# Patient Record
Sex: Female | Born: 1955
Health system: Southern US, Community
[De-identification: ages and names within clinical notes are randomized; demographics above are authoritative.]

## PROBLEM LIST (undated history)

## (undated) ENCOUNTER — Emergency Department (HOSPITAL_COMMUNITY): Payer: 59 | Source: Home / Self Care

## (undated) DIAGNOSIS — Z973 Presence of spectacles and contact lenses: Secondary | ICD-10-CM

## (undated) DIAGNOSIS — D219 Benign neoplasm of connective and other soft tissue, unspecified: Secondary | ICD-10-CM

## (undated) DIAGNOSIS — R8789 Other abnormal findings in specimens from female genital organs: Secondary | ICD-10-CM

## (undated) DIAGNOSIS — D649 Anemia, unspecified: Secondary | ICD-10-CM

## (undated) DIAGNOSIS — R112 Nausea with vomiting, unspecified: Secondary | ICD-10-CM

## (undated) DIAGNOSIS — M1990 Unspecified osteoarthritis, unspecified site: Secondary | ICD-10-CM

## (undated) DIAGNOSIS — T7840XA Allergy, unspecified, initial encounter: Secondary | ICD-10-CM

## (undated) DIAGNOSIS — J4520 Mild intermittent asthma, uncomplicated: Secondary | ICD-10-CM

## (undated) DIAGNOSIS — Z9889 Other specified postprocedural states: Secondary | ICD-10-CM

## (undated) DIAGNOSIS — H269 Unspecified cataract: Secondary | ICD-10-CM

## (undated) DIAGNOSIS — D259 Leiomyoma of uterus, unspecified: Secondary | ICD-10-CM

## (undated) DIAGNOSIS — K219 Gastro-esophageal reflux disease without esophagitis: Secondary | ICD-10-CM

## (undated) DIAGNOSIS — D573 Sickle-cell trait: Secondary | ICD-10-CM

## (undated) DIAGNOSIS — D509 Iron deficiency anemia, unspecified: Secondary | ICD-10-CM

## (undated) DIAGNOSIS — Z972 Presence of dental prosthetic device (complete) (partial): Secondary | ICD-10-CM

## (undated) DIAGNOSIS — T8859XA Other complications of anesthesia, initial encounter: Secondary | ICD-10-CM

## (undated) DIAGNOSIS — I10 Essential (primary) hypertension: Secondary | ICD-10-CM

## (undated) DIAGNOSIS — R32 Unspecified urinary incontinence: Secondary | ICD-10-CM

## (undated) DIAGNOSIS — G8929 Other chronic pain: Secondary | ICD-10-CM

## (undated) DIAGNOSIS — J45909 Unspecified asthma, uncomplicated: Secondary | ICD-10-CM

## (undated) DIAGNOSIS — N393 Stress incontinence (female) (male): Secondary | ICD-10-CM

## (undated) HISTORY — DX: Anemia, unspecified: D64.9

## (undated) HISTORY — DX: Sickle-cell trait: D57.3

## (undated) HISTORY — DX: Essential (primary) hypertension: I10

## (undated) HISTORY — DX: Unspecified urinary incontinence: R32

## (undated) HISTORY — DX: Unspecified asthma, uncomplicated: J45.909

## (undated) HISTORY — DX: Unspecified cataract: H26.9

## (undated) HISTORY — PX: COLONOSCOPY: SHX174

## (undated) HISTORY — DX: Allergy, unspecified, initial encounter: T78.40XA

## (undated) HISTORY — DX: Unspecified osteoarthritis, unspecified site: M19.90

## (undated) HISTORY — DX: Other abnormal findings in specimens from female genital organs: R87.89

## (undated) HISTORY — PX: TUBAL LIGATION: SHX77

## (undated) HISTORY — DX: Benign neoplasm of connective and other soft tissue, unspecified: D21.9

---

## 2008-07-19 ENCOUNTER — Encounter: Payer: Self-pay | Admitting: Family Medicine

## 2008-07-19 ENCOUNTER — Ambulatory Visit: Payer: Self-pay | Admitting: Family Medicine

## 2008-07-19 DIAGNOSIS — I10 Essential (primary) hypertension: Secondary | ICD-10-CM | POA: Insufficient documentation

## 2008-07-19 DIAGNOSIS — E669 Obesity, unspecified: Secondary | ICD-10-CM | POA: Insufficient documentation

## 2008-07-19 LAB — CONVERTED CEMR LAB: Whiff Test: NEGATIVE

## 2008-07-20 DIAGNOSIS — D509 Iron deficiency anemia, unspecified: Secondary | ICD-10-CM | POA: Insufficient documentation

## 2008-07-20 DIAGNOSIS — I10 Essential (primary) hypertension: Secondary | ICD-10-CM | POA: Insufficient documentation

## 2008-07-21 LAB — CONVERTED CEMR LAB
CO2: 24 meq/L (ref 19–32)
Creatinine, Ser: 0.83 mg/dL (ref 0.40–1.20)
Glucose, Bld: 83 mg/dL (ref 70–99)
HCT: 29.6 % — ABNORMAL LOW (ref 36.0–46.0)
MCV: 58.5 fL — ABNORMAL LOW (ref 78.0–100.0)
RBC: 5.06 M/uL (ref 3.87–5.11)
Sodium: 134 meq/L — ABNORMAL LOW (ref 135–145)
Total Bilirubin: 0.7 mg/dL (ref 0.3–1.2)
Total Protein: 7.9 g/dL (ref 6.0–8.3)
WBC: 7.2 10*3/uL (ref 4.0–10.5)

## 2008-07-22 ENCOUNTER — Encounter: Payer: Self-pay | Admitting: Family Medicine

## 2009-02-18 ENCOUNTER — Ambulatory Visit: Payer: Self-pay | Admitting: Family Medicine

## 2009-04-11 ENCOUNTER — Telehealth: Payer: Self-pay | Admitting: *Deleted

## 2009-04-18 ENCOUNTER — Encounter: Admission: RE | Admit: 2009-04-18 | Discharge: 2009-04-18 | Payer: Self-pay | Admitting: Family Medicine

## 2009-04-26 ENCOUNTER — Ambulatory Visit: Payer: Self-pay | Admitting: Family Medicine

## 2009-04-26 LAB — CONVERTED CEMR LAB: Hemoglobin: 11.8 g/dL

## 2009-05-09 ENCOUNTER — Ambulatory Visit: Payer: Self-pay | Admitting: Gastroenterology

## 2009-05-25 ENCOUNTER — Ambulatory Visit: Payer: Self-pay | Admitting: Gastroenterology

## 2010-06-06 ENCOUNTER — Encounter: Admission: RE | Admit: 2010-06-06 | Discharge: 2010-06-06 | Payer: Self-pay | Admitting: Family Medicine

## 2010-06-12 ENCOUNTER — Ambulatory Visit: Payer: Self-pay | Admitting: Family Medicine

## 2010-06-12 ENCOUNTER — Ambulatory Visit (HOSPITAL_COMMUNITY): Admission: RE | Admit: 2010-06-12 | Discharge: 2010-06-12 | Payer: Self-pay | Admitting: Family Medicine

## 2010-06-12 ENCOUNTER — Other Ambulatory Visit: Admission: RE | Admit: 2010-06-12 | Discharge: 2010-06-12 | Payer: Self-pay | Admitting: Family Medicine

## 2010-06-12 ENCOUNTER — Encounter: Payer: Self-pay | Admitting: Family Medicine

## 2010-06-12 DIAGNOSIS — N760 Acute vaginitis: Secondary | ICD-10-CM | POA: Insufficient documentation

## 2010-06-12 DIAGNOSIS — I739 Peripheral vascular disease, unspecified: Secondary | ICD-10-CM | POA: Insufficient documentation

## 2010-06-12 DIAGNOSIS — M542 Cervicalgia: Secondary | ICD-10-CM | POA: Insufficient documentation

## 2010-06-12 LAB — CONVERTED CEMR LAB

## 2010-06-20 ENCOUNTER — Encounter: Payer: Self-pay | Admitting: Family Medicine

## 2010-07-04 ENCOUNTER — Ambulatory Visit: Payer: Self-pay | Admitting: Family Medicine

## 2010-07-18 ENCOUNTER — Encounter: Payer: Self-pay | Admitting: Family Medicine

## 2010-07-18 LAB — CONVERTED CEMR LAB
AST: 11 units/L
Alkaline Phosphatase: 45 units/L
BUN: 19 mg/dL
Calcium: 9 mg/dL
Cholesterol: 134 mg/dL
Creatinine, Ser: 0.9 mg/dL
Hemoglobin: 10.5 g/dL
MCHC: 35 g/dL
MCV: 67 fL
Potassium: 4.6 meq/L
RDW: 17.6 %
Total Bilirubin: 0.8 mg/dL

## 2010-07-19 ENCOUNTER — Encounter: Payer: Self-pay | Admitting: Family Medicine

## 2010-09-19 NOTE — Miscellaneous (Signed)
  Clinical Lists Changes  Observations: Added new observation of TSH: 2.6 microintl units/mL (07/18/2010 14:09) Added new observation of CALCIUM: 9.0 mg/dL (09/81/1914 78:29) Added new observation of ALBUMIN: 4.4 g/dL (56/21/3086 57:84) Added new observation of SGPT (ALT): 8.0 units/L (07/18/2010 14:09) Added new observation of SGOT (AST): 11 units/L (07/18/2010 14:09) Added new observation of ALK PHOS: 45 units/L (07/18/2010 14:09) Added new observation of BILI TOTAL: 0.8 mg/dL (69/62/9528 41:32) Added new observation of CREATININE: 0.9 mg/dL (44/08/270 53:66) Added new observation of BUN: 19 mg/dL (44/10/4740 59:56) Added new observation of BG RANDOM: 114 mg/dL (38/75/6433 29:51) Added new observation of CO2 PLSM/SER: 26 meq/L (07/18/2010 14:09) Added new observation of CL SERUM: 105 meq/L (07/18/2010 14:09) Added new observation of K SERUM: 4.6 meq/L (07/18/2010 14:09) Added new observation of NA: 143 meq/L (07/18/2010 14:09) Added new observation of PLATELETK/UL: 253 K/uL (07/18/2010 14:09) Added new observation of RDW: 17.6 % (07/18/2010 14:09) Added new observation of MCHC RBC: 35 g/dL (88/41/6606 30:16) Added new observation of MCV: 67 fL (07/18/2010 14:09) Added new observation of HCT: 29.3 % (07/18/2010 14:09) Added new observation of HGB: 10.5 g/dL (08/28/3233 57:32) Added new observation of LDL: 84 mg/dL (20/25/4270 62:37) Added new observation of HDL: 34 mg/dL (62/83/1517 61:60) Added new observation of TRIGLYC TOT: 79 mg/dL (73/71/0626 94:85) Added new observation of CHOLESTEROL: 134 mg/dL (46/27/0350 09:38)

## 2010-09-19 NOTE — Miscellaneous (Signed)
  Clinical Lists Changes  Problems: Removed problem of HEALTH MAINTENANCE EXAM (ICD-V70.0) Removed problem of SCREENING FOR MALIGNANT NEOPLASM OF THE CERVIX (ICD-V76.2)

## 2010-09-19 NOTE — Assessment & Plan Note (Signed)
Summary: PVD testing- [facility]   Vital Signs:  Patient profile:   55 year old female Weight:      191.5 pounds Pulse rate:   83 / minute BP sitting:   152 / 89  (left arm)  History of Present Illness: Reports intermittent pain with walking that is not reproducible.  Pain is described as throbbing after random intervals of walking.   Reports pain while sleeping 3-4 times per week. Denies pain worsens when walking up hill or in a hurry.  Patient denies smoking and is not diagnosed with diabetes mellitus.      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 She is managing this nocturnal pain with 800mg  ibuprofen which relieves her pain.   Educated patient on results.  Verbalized understanding of results.  F/U Clinic Visit with Luretha Murphy in 2-3 weeks to continue work up of leg pain.  Total time with patient in face-to-face counseling: 20     minutes.  Patient seen with: Linward Headland, PharmD candidate.   Orders: Inital Assessment Each - [facility] [phone])  Complete Medication List: 1)  Cardizem La 240 Mg Xr24h-tab (Diltiazem hcl coated beads) .... One daily 2)  Ferrous Sulfate 324 Mg Tbec (Ferrous sulfate) .... Daily 3)  Tramadol-acetaminophen 37.5-325 Mg Tabs (Tramadol-acetaminophen) .... One-two three times a day as needed pain 4)   Ibuprofen 800 Mg Tabs (Ibuprofen) .... Three times a day as needed for pain 5)  Flexeril 10 Mg Tabs (Cyclobenzaprine hcl) .... One at bedtime as needed muscle spasm 6)  Lisinopril-hydrochlorothiazide 10-12.5 Mg Tabs (Lisinopril-hydrochlorothiazide) .... One daily   Orders Added: 1)  Inital Assessment Each - [facility] [76195]

## 2010-09-19 NOTE — Assessment & Plan Note (Signed)
Summary: cpe,df   Vital Signs:  Patient profile:   55 year old female Height:      65.5 inches Weight:      192 pounds BMI:     31.58 Temp:     99.1 degrees F oral Pulse rate:   101 / minute BP sitting:   147 / 87  (left arm) Cuff size:   regular  Vitals Entered By: Tessie Fass CMA (June 12, 2010 8:35 AM) CC: complete physical with pap Is Patient Diabetic? No Pain Assessment Patient in pain? no        CC:  complete physical with pap.  History of Present Illness: Patient is here for her yearly physical.  CC is pain in her lower legs that occurs when she walks, if she stops the pain goes away.  She also wakes up in the night with her legs hurting and moves them around and it resolves, denies needing to dangle her feet.  This seems to be intermittent.  She has wanted to start an exercise program but the pain has stopped her.  It does not happen when she is riding a satationary bike.  Her feet are also cold.  Dhe denies edema, ulcerations.  She gets short of breath when she climbs stairs.  She does not climb stairs often, and will recover quickly.  She noticed this when her and a freind did this a few weeks ago and she became worried.  Has received her lipids with the hospital screenings and will again in November.  The BP meds she has been on, she came to [place] with this regement.  This was started for a rapid heart rate, and she was told that she did not have a heart attack.  Still has neck strain and shoulder pain and uses tramadol/APAP, ibuprofen, and Flexeril at hs as needed.   Itching in her vulva.  Habits & Providers  Alcohol-Tobacco-Diet     Alcohol drinks/day: 0     Tobacco Status: never     Diet Comments: Keep it  up, has been changing to a more healthy diet  Exercise-Depression-Behavior     Does Patient Exercise: yes     Type of exercise: stationary bike and walks     Exercise (avg: min/session): <30     Times/week: 3  Current Medications (verified): 1)   Cardizem La 240 Mg Xr24h-Tab (Diltiazem Hcl Coated Beads) .... One Daily 2)  Ferrous Sulfate 324 Mg Tbec (Ferrous Sulfate) .... Daily 3)  Tramadol-Acetaminophen 37.5-325 Mg Tabs (Tramadol-Acetaminophen) .... One-Two Three Times A Day As Needed Pain 4)  Ibuprofen 800 Mg Tabs (Ibuprofen) .... Three Times A Day As Needed For Pain 5)  Flexeril 10 Mg Tabs (Cyclobenzaprine Hcl) .... One At Bedtime As Needed Muscle Spasm 6)  Lisinopril-Hydrochlorothiazide 10-12.5 Mg Tabs (Lisinopril-Hydrochlorothiazide) .... One Daily  Allergies (verified): No Known Drug Allergies  Social History: HS graduate, some college Divorced Lives with daughter who is a Charity fundraiser and grandson Moved from [place], [place] because of unemployment Works at [employer] Patient Exercise:  yes  Review of Systems      See HPI General:  Denies fatigue, malaise, and sweats. CV:  Complains of shortness of breath with exertion; denies chest pain or discomfort and swelling of feet; lower leg pain with exertion, denies cramps. GU:  itching on the outside. MS:  neck pain.  Physical Exam  General:  Well-developed,well-nourished,in no acute distress; alert,appropriate and cooperative throughout examination Eyes:  No corneal or conjunctival inflammation noted. EOMI.  Perrla. Funduscopic exam benign, without hemorrhages, exudates or papilledema. Vision grossly normal. Ears:  External ear exam shows no significant lesions or deformities.  Otoscopic examination reveals clear canals, tympanic membranes are intact bilaterally without bulging, retraction, inflammation or discharge. Hearing is grossly normal bilaterally. Mouth:  Oral mucosa and oropharynx without lesions or exudates.  Teeth in good repair. Neck:  No deformities, masses, or tenderness noted. Breasts:  No mass, nodules, thickening, tenderness, bulging, retraction, inflamation, nipple discharge or skin changes noted.   Lungs:  Normal respiratory effort, chest expands symmetrically.  Lungs are clear to auscultation, no crackles or wheezes. Heart:  Normal rate and regular rhythm. S1 and S2 normal without gallop, murmur, click, rub or other extra sounds. Abdomen:  Bowel sounds positive,abdomen soft and non-tender without masses, organomegaly or hernias noted. Genitalia:  Normal introitus for age, no external lesions, no vaginal discharge, mucosa pink and moist, no vaginal or cervical lesions, no vaginal atrophy, no friaility or hemorrhage, normal uterus size and position, no adnexal masses or tenderness Wet prep + wiff Pulses:  LE with decreased pulses at popliteal, pt, and pedal- this was symmetrical. Extremities:  No clubbing, cyanosis, edema, or deformity noted with normal full range of motion of all joints.   Neurologic:  alert & oriented X3 and cranial nerves II-XII intact.   Skin:  Intact without suspicious lesions or rashes Cervical Nodes:  No lymphadenopathy noted Axillary Nodes:  No palpable lymphadenopathy Psych:  memory intact for recent and remote.     Impression & Recommendations:  Problem # 1:  HEALTH MAINTENANCE EXAM (ICD-V70.0)  Will complete lipid screen with hosptial screening program.  Orders: [facility] - Est  40-64 yrs (81191)  Problem # 2:  HYPERTENSION (ICD-401.9) Switch thiazide to ACE/thiazide combo and check labs in 1-2 weeks, counseled on angiodema.  Rechek BP then. The following medications were removed from the medication list:    Chlorthalidone 25 Mg Tabs (Chlorthalidone) .Marland Kitchen... 1/2 tab daily Her updated medication list for this problem includes:    Cardizem La 240 Mg Xr24h-tab (Diltiazem hcl coated beads) ..... One daily    Lisinopril-hydrochlorothiazide 10-12.5 Mg Tabs (Lisinopril-hydrochlorothiazide) ..... 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  She seems certain that she walks short distances and her legs cramp, will check labs  when she return.  Pulses were diminished in LE, to return for PVD testing with Dr. Raymondo Band as first step.  Problem # 4:  VULVOVAGINITIS (ICD-616.10) Topicals, OTC products to be contiued, no yeast found on wet mount. Orders: Wet Prep- [facility] ([phone])  Problem # 5:  SCREENING FOR MALIGNANT NEOPLASM OF THE CERVIX (ICD-V76.2)  Orders: Pap Smear-[facility] ([id number]) Pap Smear-[facility] ([id number])  Problem # 6:  ANEMIA-IRON DEFICIENCY (ICD-280.9) Still having periods at 53, they are regular, she takes iron as needed  Her updated medication list for this problem includes:    Ferrous Sulfate 324 Mg Tbec (Ferrous sulfate) .Marland Kitchen... Daily  Future Orders: CBC-[facility] ([id number]) ... 06/18/2011  Problem # 7:  NECK PAIN (ICD-723.1) chronic. as needed meds as listed, refilled Her updated medication list for this problem includes:    Tramadol-acetaminophen 37.5-325 Mg Tabs (Tramadol-acetaminophen) ..... One-two three times a day as needed pain    Ibuprofen 800 Mg Tabs (Ibuprofen) .Marland Kitchen... Three times a day as needed for pain    Flexeril 10 Mg Tabs (Cyclobenzaprine hcl) ..... One at bedtime as needed muscle spasm  Complete Medication List: 1)  Cardizem La 240 Mg Xr24h-tab (Diltiazem hcl coated beads) .... One daily 2)  Ferrous Sulfate 324 Mg Tbec (Ferrous sulfate) .... Daily 3)  Tramadol-acetaminophen 37.5-325 Mg Tabs (Tramadol-acetaminophen) .... One-two three times a day as needed pain 4)  Ibuprofen 800 Mg Tabs (Ibuprofen) .... Three times a day as needed for pain 5)  Flexeril 10 Mg Tabs (Cyclobenzaprine hcl) .... One at bedtime as needed muscle spasm 6)  Lisinopril-hydrochlorothiazide 10-12.5 Mg Tabs (Lisinopril-hydrochlorothiazide) .... One daily  Other Orders: Future Orders: Comp Met-[facility] ([id number]) ... 06/18/2011 Lipid-[facility] ([id number]) ... 06/18/2011  Patient Instructions: 1)  Apt with Dr. Raymondo Band for PVD testing and labs the same day -in 1-2 weeks. Please come in fasting. 2)  New BP med, lisinopril with  HCTZ; if you would develop swelling of your face call immediatly. Prescriptions: FLEXERIL 10 MG TABS (CYCLOBENZAPRINE HCL) one at bedtime as needed muscle spasm  #30 x 3   Entered and Authorized by:   Luretha Murphy NP   Signed by:   Luretha Murphy NP on 06/12/2010   Method used:   Electronically to        [facility]* (retail)       [address]       [address] Shipping/mailing       [place], [place]  [address]       Ph: [phone]       Fax: [phone]   RxID:   [id number] IBUPROFEN 800 MG TABS (IBUPROFEN) three times a day as needed for pain  #90 x 3   Entered and Authorized by:   Luretha Murphy NP   Signed by:   Luretha Murphy NP on 06/12/2010   Method used:   Electronically to        [facility]* (retail)       [address]       [address] Shipping[address]       [place], [place]  [address]       Ph: [phone]       Fax: [phone]   RxID:   [id number] TRAMADOL-ACETAMINOPHEN 37.5-325 MG TABS (TRAMADOL-ACETAMINOPHEN) one-two three times a day as needed pain  #90 x 0   Entered and Authorized by:   Luretha Murphy NP   Signed by:   Luretha Murphy NP on 06/12/2010   Method used:   Electronically to        [facility]* (retail)       [address]       [address]       [place], [place]  [address]       Ph: [phone]       Fax: [phone]   RxID:   [id number] LISINOPRIL-HYDROCHLOROTHIAZIDE 10-12.5 MG TABS (LISINOPRIL-HYDROCHLOROTHIAZIDE) one daily  #90 x 3   Entered and Authorized by:   Luretha Murphy NP   Signed by:   Luretha Murphy NP on 06/12/2010   Method used:   Electronically to        [facility]* (retail)       [address]       [address]       [place], [place]  [address]       Ph: [phone]       Fax: [phone]   RxID:   [id number] CARDIZEM LA 240 MG XR24H-TAB (DILTIAZEM HCL COATED BEADS) one daily  #90 x  3   Entered and  Authorized by:   Luretha Murphy NP   Signed by:   Luretha Murphy NP on 06/12/2010   Method used:   Electronically to        [facility]* (retail)       [address]       [address] Shipping[address]mail[address]       [place], [place]  [address]       Ph: [phone]       Fax: [phone]   RxID:   [id number]    Orders Added: 1)  Pap Smear-[facility] [[id number]] 2)  Mellody Drown Prep- [facility] [87210] 3)  EKG- [facility] [EKG] 4)  12 Lead EKG [12 Lead EKG] 5)  Pap Smear-[facility] [[id number]] 6)  Wet Prep- [facility] [87210] 7)  Comp Met-[facility] [[id number]] 8)  Lipid-[facility] [[id number]] 9)  CBC-[facility] [85027] 10)  [facility] - Est  40-64 yrs [[id number]]    Laboratory Results  Date/Time Received: June 12, 2010 9:24 AM  Date/Time Reported: June 12, 2010 9:31 AM   [employer] Source: vag WBC/hpf: <5 Bacteria/hpf: 3+  Cocci Clue cells/hpf: moderate  Positive whiff Yeast/hpf: none Trichomonas/hpf: none Comments: ...............test performed by......Marland KitchenBonnie A. [place], MLS (ASCP)cm

## 2011-07-02 ENCOUNTER — Other Ambulatory Visit: Payer: Self-pay | Admitting: Family Medicine

## 2011-07-02 ENCOUNTER — Ambulatory Visit (INDEPENDENT_AMBULATORY_CARE_PROVIDER_SITE_OTHER): Payer: 59 | Admitting: Family Medicine

## 2011-07-02 ENCOUNTER — Encounter: Payer: Self-pay | Admitting: Family Medicine

## 2011-07-02 DIAGNOSIS — Z1231 Encounter for screening mammogram for malignant neoplasm of breast: Secondary | ICD-10-CM

## 2011-07-02 DIAGNOSIS — I739 Peripheral vascular disease, unspecified: Secondary | ICD-10-CM

## 2011-07-02 DIAGNOSIS — M542 Cervicalgia: Secondary | ICD-10-CM

## 2011-07-02 DIAGNOSIS — Z0000 Encounter for general adult medical examination without abnormal findings: Secondary | ICD-10-CM

## 2011-07-02 DIAGNOSIS — I10 Essential (primary) hypertension: Secondary | ICD-10-CM

## 2011-07-02 DIAGNOSIS — Z1239 Encounter for other screening for malignant neoplasm of breast: Secondary | ICD-10-CM | POA: Insufficient documentation

## 2011-07-02 DIAGNOSIS — D649 Anemia, unspecified: Secondary | ICD-10-CM

## 2011-07-02 DIAGNOSIS — D509 Iron deficiency anemia, unspecified: Secondary | ICD-10-CM

## 2011-07-02 DIAGNOSIS — E663 Overweight: Secondary | ICD-10-CM

## 2011-07-02 LAB — BASIC METABOLIC PANEL
BUN: 11 mg/dL (ref 6–23)
Chloride: 105 mEq/L (ref 96–112)
Glucose, Bld: 91 mg/dL (ref 70–99)
Potassium: 4.2 mEq/L (ref 3.5–5.3)

## 2011-07-02 MED ORDER — TRAMADOL-ACETAMINOPHEN 37.5-325 MG PO TABS: 1.0000 | ORAL_TABLET | Freq: Four times a day (QID) | ORAL | Status: DC | PRN

## 2011-07-02 MED ORDER — CYCLOBENZAPRINE HCL 10 MG PO TABS: 10.0000 mg | ORAL_TABLET | Freq: Every evening | ORAL | Status: DC | PRN

## 2011-07-02 MED ORDER — DILTIAZEM HCL ER COATED BEADS 240 MG PO TB24: 240.0000 mg | ORAL_TABLET | Freq: Every day | ORAL | Status: DC

## 2011-07-02 MED ORDER — IBUPROFEN 800 MG PO TABS: 800.0000 mg | ORAL_TABLET | Freq: Three times a day (TID) | ORAL | Status: DC | PRN

## 2011-07-02 MED ORDER — DOCUSATE SODIUM 100 MG PO CAPS: 100.0000 mg | ORAL_CAPSULE | Freq: Two times a day (BID) | ORAL | Status: AC

## 2011-07-02 MED ORDER — LISINOPRIL-HYDROCHLOROTHIAZIDE 10-12.5 MG PO TABS: 1.0000 | ORAL_TABLET | Freq: Every day | ORAL | Status: DC

## 2011-07-02 MED ORDER — FERROUS SULFATE 325 (65 FE) MG PO TABS: 325.0000 mg | ORAL_TABLET | Freq: Two times a day (BID) | ORAL | Status: DC

## 2011-07-02 NOTE — Assessment & Plan Note (Signed)
Patient is went from 191.5 pounds in November of last year 278 pounds today. Went from BMI of 31.37-28.8. She attributes this to her walking more and to eating a more healthy diet the patient was congratulated on this accomplishment and encouraged to continue what she is doing.
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
## 2011-07-02 NOTE — Assessment & Plan Note (Addendum)
Patient reports not taking her iron due to constipation. Her last CBC in November of 2011 showed a hemoglobin of 10.5, stable from previous years . It also showed a low MCV. Iron studies in the past have shown iron deficiency anemia with a low ferritin iron and normal TIBC. In light of this we will repeat a CBC today. We will also start iron ferrous sulfate 325 mg twice a day. In order to help with constipation, patient was given the option to either use Metamucil on a daily basis or to take Colace 100 mg twice a day. Colace prescription was sent to the pharmacy in case patient opted for this. Patient had a normal colonoscopy this year making a gastrointestinal related anemia less likely. Moreover since patient does not report having any increased menstrual bleeding it is probably not due to that as well.

## 2011-07-02 NOTE — Patient Instructions (Addendum)
It was great meeting you today! You are doing a wonderful job with the weight loss. Continue walking and eating a varied diet filled with vegetables and baked meats. For your anemia, I'll start you back on the iron twice daily and will prescribe the colace pill for you to take as you need in case of contipation. You can also take metamucyl once daily. We will check a blood count to see where you are. I will also refill your blood pressure medicine and check some lab work to make sure that your electrolytes are normal.

## 2011-07-02 NOTE — Assessment & Plan Note (Signed)
Patient currently on lisinopril HCTZ 1012.5 mg and diltiazem 240 mg. With the patient reporting having blood pressure around 130/80 at home, will continue with current regimen. Moreover patient is actively exercising and modifying her diet to a healthier diet filled with vegetables and baked meats. Will obtain V-max to followup creatinine and potassium since patient is on listener perle and HCTZ.

## 2011-07-02 NOTE — Assessment & Plan Note (Signed)
Much improved compared to last year. Only has pain once in a while. Patient has been able to walk and exercise.

## 2011-07-02 NOTE — Assessment & Plan Note (Signed)
Colonoscopy: Patient had normal colonoscopy in November 2012 and is not due for another 8-10 years. Lipid panel: Last screen was in November of 2011 which showed LDL 84. Patient not due for another panel until 3-5 years from now. Mammogram: normal mammogram in October 2011. Mammogram was scheduled today for later this month. Pap smear: Last Pap smear was in November 2011 and was normal. Last 3 Pap smears have been normal. Patient denies any abnormal Pap smears. In light of this patient's Pap smear is not due until 2014.  Vaccinations: Flu vaccine was administered at work. Patient works for [employer]. She also gets regular PPDs. She is also up-to-date on her Tdap.

## 2011-07-03 LAB — CBC WITH DIFFERENTIAL/PLATELET
Basophils Absolute: 0 10*3/uL (ref 0.0–0.1)
HCT: 27.5 % — ABNORMAL LOW (ref 36.0–46.0)
Hemoglobin: 9.7 g/dL — ABNORMAL LOW (ref 12.0–15.0)
Lymphocytes Relative: 32 % (ref 12–46)
Monocytes Absolute: 0.4 10*3/uL (ref 0.1–1.0)
Monocytes Relative: 8 % (ref 3–12)
Neutro Abs: 3.2 10*3/uL (ref 1.7–7.7)
WBC: 5.4 10*3/uL (ref 4.0–10.5)

## 2011-07-13 ENCOUNTER — Ambulatory Visit: Payer: 59

## 2011-07-17 ENCOUNTER — Ambulatory Visit
Admission: RE | Admit: 2011-07-17 | Discharge: 2011-07-17 | Disposition: A | Discharge status: Home / Self Care | Payer: 59 | Source: Ambulatory Visit | Attending: Family Medicine | Admitting: Family Medicine

## 2011-07-17 DIAGNOSIS — Z1231 Encounter for screening mammogram for malignant neoplasm of breast: Secondary | ICD-10-CM

## 2011-07-23 ENCOUNTER — Encounter: Payer: Self-pay | Admitting: Family Medicine

## 2011-11-06 ENCOUNTER — Other Ambulatory Visit: Payer: Self-pay | Admitting: Family Medicine

## 2011-11-06 DIAGNOSIS — I10 Essential (primary) hypertension: Secondary | ICD-10-CM

## 2011-11-08 MED ORDER — LISINOPRIL-HYDROCHLOROTHIAZIDE 10-12.5 MG PO TABS: 1.0000 | ORAL_TABLET | Freq: Every day | ORAL | Status: DC

## 2011-11-08 NOTE — Telephone Encounter (Signed)
Refilled lisinopril/hctz

## 2012-02-26 ENCOUNTER — Ambulatory Visit (INDEPENDENT_AMBULATORY_CARE_PROVIDER_SITE_OTHER): Payer: 59 | Admitting: Family Medicine

## 2012-02-26 ENCOUNTER — Encounter: Payer: Self-pay | Admitting: Family Medicine

## 2012-02-26 VITALS — BP 158/93 | HR 86 | Temp 99.0°F | Ht 66.0 in | Wt 184.7 lb

## 2012-02-26 DIAGNOSIS — J069 Acute upper respiratory infection, unspecified: Secondary | ICD-10-CM | POA: Insufficient documentation

## 2012-02-26 DIAGNOSIS — I10 Essential (primary) hypertension: Secondary | ICD-10-CM

## 2012-02-26 MED ORDER — LISINOPRIL-HYDROCHLOROTHIAZIDE 10-12.5 MG PO TABS: 1.0000 | ORAL_TABLET | Freq: Every day | ORAL | Status: DC

## 2012-02-26 MED ORDER — DILTIAZEM HCL ER COATED BEADS 240 MG PO TB24: 240.0000 mg | ORAL_TABLET | Freq: Every day | ORAL | Status: DC

## 2012-02-26 MED ORDER — ACETAMINOPHEN-CODEINE 300-60 MG PO TABS: 1.0000 | ORAL_TABLET | ORAL | Status: AC | PRN

## 2012-02-26 MED ORDER — FLUTICASONE PROPIONATE 50 MCG/ACT NA SUSP: 2.0000 | Freq: Every day | NASAL | Status: DC

## 2012-02-26 NOTE — Patient Instructions (Addendum)
I am sorry that you are feeling so badly. Viruses can be just as bad as bacteria.   For the cough, I am sending in a prescription for tylenol with codeine. This should also help with the sore throat.   For the nasal congestion you are having, I am prescribing a steroid nasal spray called flonase.   For the congestion, you can take over the counter guaifenesin.   You should get better in 7-10 days. If you do not get better by then or get worst, please come back to the clinic.   Upper Respiratory Infection, Adult An upper respiratory infection (URI) is also sometimes known as the common cold. The upper respiratory tract includes the nose, sinuses, throat, trachea, and bronchi. Bronchi are the airways leading to the lungs. Most people improve within 1 week, but symptoms can last up to 2 weeks. A residual cough may last even longer.  CAUSES Many different viruses can infect the tissues lining the upper respiratory tract. The tissues become irritated and inflamed and often become very moist. Mucus production is also common. A cold is contagious. You can easily spread the virus to others by oral contact. This includes kissing, sharing a glass, coughing, or sneezing. Touching your mouth or nose and then touching a surface, which is then touched by another person, can also spread the virus. SYMPTOMS  Symptoms typically develop 1 to 3 days after you come in contact with a cold virus. Symptoms vary from person to person. They may include:  Runny nose.   Sneezing.   Nasal congestion.   Sinus irritation.   Sore throat.   Loss of voice (laryngitis).   Cough.   Fatigue.   Muscle aches.   Loss of appetite.   Headache.   Low-grade fever.  DIAGNOSIS  You might diagnose your own cold based on familiar symptoms, since most people get a cold 2 to 3 times a year. Your caregiver can confirm this based on your exam. Most importantly, your caregiver can check that your symptoms are not due to  another disease such as strep throat, sinusitis, pneumonia, asthma, or epiglottitis. Blood tests, throat tests, and X-rays are not necessary to diagnose a common cold, but they may sometimes be helpful in excluding other more serious diseases. Your caregiver will decide if any further tests are required. RISKS AND COMPLICATIONS  You may be at risk for a more severe case of the common cold if you smoke cigarettes, have chronic heart disease (such as heart failure) or lung disease (such as asthma), or if you have a weakened immune system. The very young and very old are also at risk for more serious infections. Bacterial sinusitis, middle ear infections, and bacterial pneumonia can complicate the common cold. The common cold can worsen asthma and chronic obstructive pulmonary disease (COPD). Sometimes, these complications can require emergency medical care and may be life-threatening. PREVENTION  The best way to protect against getting a cold is to practice good hygiene. Avoid oral or hand contact with people with cold symptoms. Wash your hands often if contact occurs. There is no clear evidence that vitamin C, vitamin E, echinacea, or exercise reduces the chance of developing a cold. However, it is always recommended to get plenty of rest and practice good nutrition. TREATMENT  Treatment is directed at relieving symptoms. There is no cure. Antibiotics are not effective, because the infection is caused by a virus, not by bacteria. Treatment may include:  Increased fluid intake. Sports drinks offer valuable  electrolytes, sugars, and fluids.   Breathing heated mist or steam (vaporizer or shower).   Eating chicken soup or other clear broths, and maintaining good nutrition.   Getting plenty of rest.   Using gargles or lozenges for comfort.   Controlling fevers with ibuprofen or acetaminophen as directed by your caregiver.   Increasing usage of your inhaler if you have asthma.  Zinc gel and zinc  lozenges, taken in the first 24 hours of the common cold, can shorten the duration and lessen the severity of symptoms. Pain medicines may help with fever, muscle aches, and throat pain. A variety of non-prescription medicines are available to treat congestion and runny nose. Your caregiver can make recommendations and may suggest nasal or lung inhalers for other symptoms.  HOME CARE INSTRUCTIONS   Only take over-the-counter or prescription medicines for pain, discomfort, or fever as directed by your caregiver.   Use a warm mist humidifier or inhale steam from a shower to increase air moisture. This may keep secretions moist and make it easier to breathe.   Drink enough water and fluids to keep your urine clear or pale yellow.   Rest as needed.   Return to work when your temperature has returned to normal or as your caregiver advises. You may need to stay home longer to avoid infecting others. You can also use a face mask and careful hand washing to prevent spread of the virus.  SEEK MEDICAL CARE IF:   After the first few days, you feel you are getting worse rather than better.   You need your caregiver's advice about medicines to control symptoms.   You develop chills, worsening shortness of breath, or brown or red sputum. These may be signs of pneumonia.   You develop yellow or brown nasal discharge or pain in the face, especially when you bend forward. These may be signs of sinusitis.   You develop a fever, swollen neck glands, pain with swallowing, or white areas in the back of your throat. These may be signs of strep throat.  SEEK IMMEDIATE MEDICAL CARE IF:   You have a fever.   You develop severe or persistent headache, ear pain, sinus pain, or chest pain.   You develop wheezing, a prolonged cough, cough up blood, or have a change in your usual mucus (if you have chronic lung disease).   You develop sore muscles or a stiff neck.  Document Released: 01/30/2001 Document Revised:  07/26/2011 Document Reviewed: 12/08/2010 [facility] Patient Information 2012 [place], [place].

## 2012-02-26 NOTE — Progress Notes (Signed)
Patient ID: Lacey Barron, female   DOB: 06-11-1956, 56 y.o.   MRN: [id number] Patient ID: Lacey Barron    DOB: August 16, 1956, 56 y.o.   MRN: [id number] --- Subjective:  Lacey Barron is a 55 y.o.female who presents with sore throat, cough and congestion. Onset: Thursday started with sore throat. Severity: Moderate Course of symptoms over time: Worsening in the last she Aggravating: Sore throat aggravated by cough Alleviating: cepacol lozenges non-throat. NyQuil capsule helped her sleep 4 hours. Associated sx/sn: Positive for the following: Frontal headache, sore throat, achiness, pressure in her head, rhinorrhea, nonproductive cough, chills no fever. Last dose of ibuprofen was last night. Sick contacts: Children with cough. Denies shortness of breath the  ROS: see HPI Past Medical History: reviewed and updated medications and allergies. Social History: Tobacco: Denies  Objective: Filed Vitals:   02/26/12 1538  BP: 158/93  Pulse: 86  Temp: 99 F (37.2 C)    Physical Examination:   General appearance - alert, well appearing, and in no distress except for when she coughs Ears - bilateral TM's and external ear canals normal Nose - normal and patent, erythematous and congested nasal turbinates. No sinus tenderness. No drainage Mouth - mucous membranes moist, pharynx normal without lesions, erythematous oropharynx. No exudate Neck - supple, no significant adenopathy, no lymphadenopathy Chest - clear to auscultation, no wheezes, rales or rhonchi, symmetric air entry Heart - normal rate, regular rhythm, normal S1, S2, no murmurs, rubs, clicks or gallops

## 2012-02-26 NOTE — Assessment & Plan Note (Signed)
No evidence of bacterial infection. Mostly viral. Symptomatically treat with oxycodone with codeine, guaifenesin, and Nasonex for nasal turbinate congestion. See AVS for further patient recommendations

## 2012-07-29 ENCOUNTER — Ambulatory Visit (INDEPENDENT_AMBULATORY_CARE_PROVIDER_SITE_OTHER): Payer: 59 | Admitting: Family Medicine

## 2012-07-29 ENCOUNTER — Other Ambulatory Visit: Payer: Self-pay | Admitting: Family Medicine

## 2012-07-29 ENCOUNTER — Encounter: Payer: Self-pay | Admitting: Family Medicine

## 2012-07-29 VITALS — BP 149/90 | HR 90 | Ht 66.0 in | Wt 185.0 lb

## 2012-07-29 DIAGNOSIS — N951 Menopausal and female climacteric states: Secondary | ICD-10-CM

## 2012-07-29 DIAGNOSIS — I10 Essential (primary) hypertension: Secondary | ICD-10-CM

## 2012-07-29 DIAGNOSIS — L909 Atrophic disorder of skin, unspecified: Secondary | ICD-10-CM

## 2012-07-29 DIAGNOSIS — L821 Other seborrheic keratosis: Secondary | ICD-10-CM

## 2012-07-29 DIAGNOSIS — L918 Other hypertrophic disorders of the skin: Secondary | ICD-10-CM

## 2012-07-29 DIAGNOSIS — M542 Cervicalgia: Secondary | ICD-10-CM

## 2012-07-29 DIAGNOSIS — Z1231 Encounter for screening mammogram for malignant neoplasm of breast: Secondary | ICD-10-CM

## 2012-07-29 DIAGNOSIS — L919 Hypertrophic disorder of the skin, unspecified: Secondary | ICD-10-CM

## 2012-07-29 MED ORDER — TRAMADOL-ACETAMINOPHEN 37.5-325 MG PO TABS: 1.0000 | ORAL_TABLET | Freq: Four times a day (QID) | ORAL | Status: DC | PRN

## 2012-07-29 MED ORDER — LISINOPRIL-HYDROCHLOROTHIAZIDE 20-12.5 MG PO TABS: 1.0000 | ORAL_TABLET | Freq: Every day | ORAL | Status: DC

## 2012-07-29 NOTE — Patient Instructions (Signed)
We are increasing the blood pressure medicine. I would like to see you back in 3 weeks to check the blood pressure and get some lab work since the medicine can affect your potassium level.   At that time, we can take the skin tags off as well.

## 2012-07-30 DIAGNOSIS — L918 Other hypertrophic disorders of the skin: Secondary | ICD-10-CM | POA: Insufficient documentation

## 2012-07-30 DIAGNOSIS — N951 Menopausal and female climacteric states: Secondary | ICD-10-CM | POA: Insufficient documentation

## 2012-07-30 DIAGNOSIS — L821 Other seborrheic keratosis: Secondary | ICD-10-CM | POA: Insufficient documentation

## 2012-07-30 NOTE — Assessment & Plan Note (Signed)
Skin tag on neck is irritated and catching on clothes. Patient to follow up for removal.

## 2012-07-30 NOTE — Assessment & Plan Note (Signed)
Patient appears to be perimenopausal. Continue to monitor. Health maintenance wise, PAP to be obtained in November 2014 per new guidelines since last PAP in nov 2011 and no h/o abnormal PAP.  Mammogram scheduled.

## 2012-07-30 NOTE — Progress Notes (Signed)
Patient ID: Lacey Barron    DOB: February 21, 1956, 56 y.o.   MRN: [id number] --- Subjective:  Lacey Barron is a 56 y.o.female with h/o HTN who presents for physical.  - GYN: having irregular period x2years. Has not entered menopause yet. Denies any vaginal discharge, itching. No concern for STD.  LAst mammogram: 07/17/2011: normal Last PAP: 06/12/10: normal, no h/o abnormal PAP smears.   - HTN: taking lisinopril/HCTZ 12.5/10. No chest pain, no shortness of breath, no lower extremity swelling. Only checks it occasionnaly at home and it runs in the 130's systolic.   - skin spot on right upper breast: noticed it a few months ago. No change in shape or appearance since she noticed it. Wanted to get it checked out.   ROS: see HPI Past Medical History: reviewed and updated medications and allergies. Social History: Tobacco: denies  Objective: Filed Vitals:   07/29/12 0852  BP: 149/90  Pulse: 90    Physical Examination:   General appearance - alert, well appearing, and in no distress Nose - erythematous and congested nasal turbinates bilaterally Mouth - mucous membranes moist, pharynx normal without lesions Neck - supple, no significant adenopathy Chest - clear to auscultation, no wheezes, rales or rhonchi, symmetric air entry Heart - normal rate, regular rhythm, normal S1, S2, no murmurs, rubs, clicks or gallops Abdomen - soft, nontender, nondistended, no masses or organomegaly Extremities - peripheral pulses normal, no pedal edema Skin - left upper pectoral muscle: 0.5cm stuck on brown papule.  Left neck pedunculated skin tag.

## 2012-07-30 NOTE — Assessment & Plan Note (Signed)
Elevated BP on repeat occasions while in the office. Will increase HCTZ/lisinopril dose to 12.5/20 from 12.5/10. Patient to follow up in 3 weeks for repeat BP and for BMP.

## 2012-07-30 NOTE — Assessment & Plan Note (Signed)
Reassured patient on nature of skin lesion. Patient is not interested in getting it removed. Not irritating at this time.

## 2012-08-18 ENCOUNTER — Ambulatory Visit (INDEPENDENT_AMBULATORY_CARE_PROVIDER_SITE_OTHER): Payer: 59 | Admitting: Family Medicine

## 2012-08-18 ENCOUNTER — Encounter: Payer: Self-pay | Admitting: Family Medicine

## 2012-08-18 VITALS — BP 126/84 | HR 96 | Temp 98.6°F | Ht 66.0 in | Wt 189.3 lb

## 2012-08-18 DIAGNOSIS — L909 Atrophic disorder of skin, unspecified: Secondary | ICD-10-CM

## 2012-08-18 DIAGNOSIS — I10 Essential (primary) hypertension: Secondary | ICD-10-CM

## 2012-08-18 DIAGNOSIS — L918 Other hypertrophic disorders of the skin: Secondary | ICD-10-CM

## 2012-08-18 DIAGNOSIS — L919 Hypertrophic disorder of the skin, unspecified: Secondary | ICD-10-CM

## 2012-08-18 DIAGNOSIS — J069 Acute upper respiratory infection, unspecified: Secondary | ICD-10-CM

## 2012-08-18 LAB — BASIC METABOLIC PANEL
CO2: 31 mEq/L (ref 19–32)
Calcium: 9.1 mg/dL (ref 8.4–10.5)
Creat: 0.82 mg/dL (ref 0.50–1.10)
Glucose, Bld: 104 mg/dL — ABNORMAL HIGH (ref 70–99)

## 2012-08-18 MED ORDER — HYDROCODONE-HOMATROPINE 5-1.5 MG/5ML PO SYRP: 5.0000 mL | ORAL_SOLUTION | Freq: Three times a day (TID) | ORAL | Status: DC | PRN

## 2012-08-18 NOTE — Patient Instructions (Signed)
For the cough and cold, I am sending a prescription for a medicine that should help with both the cold and the sore throat. If you don't get any better in the next 4-5 days, please come back.   For the skin tags, you can apply vaseline or neosporin on them if they are a little irritated. If the area becomes very red or painful, please return for evaluation.   Upper Respiratory Infection, Adult An upper respiratory infection (URI) is also sometimes known as the common cold. The upper respiratory tract includes the nose, sinuses, throat, trachea, and bronchi. Bronchi are the airways leading to the lungs. Most people improve within 1 week, but symptoms can last up to 2 weeks. A residual cough may last even longer.  CAUSES Many different viruses can infect the tissues lining the upper respiratory tract. The tissues become irritated and inflamed and often become very moist. Mucus production is also common. A cold is contagious. You can easily spread the virus to others by oral contact. This includes kissing, sharing a glass, coughing, or sneezing. Touching your mouth or nose and then touching a surface, which is then touched by another person, can also spread the virus. SYMPTOMS  Symptoms typically develop 1 to 3 days after you come in contact with a cold virus. Symptoms vary from person to person. They may include:  Runny nose.  Sneezing.  Nasal congestion.  Sinus irritation.  Sore throat.  Loss of voice (laryngitis).  Cough.  Fatigue.  Muscle aches.  Loss of appetite.  Headache.  Low-grade fever. DIAGNOSIS  You might diagnose your own cold based on familiar symptoms, since most people get a cold 2 to 3 times a year. Your caregiver can confirm this based on your exam. Most importantly, your caregiver can check that your symptoms are not due to another disease such as strep throat, sinusitis, pneumonia, asthma, or epiglottitis. Blood tests, throat tests, and X-rays are not necessary to  diagnose a common cold, but they may sometimes be helpful in excluding other more serious diseases. Your caregiver will decide if any further tests are required. RISKS AND COMPLICATIONS  You may be at risk for a more severe case of the common cold if you smoke cigarettes, have chronic heart disease (such as heart failure) or lung disease (such as asthma), or if you have a weakened immune system. The very young and very old are also at risk for more serious infections. Bacterial sinusitis, middle ear infections, and bacterial pneumonia can complicate the common cold. The common cold can worsen asthma and chronic obstructive pulmonary disease (COPD). Sometimes, these complications can require emergency medical care and may be life-threatening. PREVENTION  The best way to protect against getting a cold is to practice good hygiene. Avoid oral or hand contact with people with cold symptoms. Wash your hands often if contact occurs. There is no clear evidence that vitamin C, vitamin E, echinacea, or exercise reduces the chance of developing a cold. However, it is always recommended to get plenty of rest and practice good nutrition. TREATMENT  Treatment is directed at relieving symptoms. There is no cure. Antibiotics are not effective, because the infection is caused by a virus, not by bacteria. Treatment may include:  Increased fluid intake. Sports drinks offer valuable electrolytes, sugars, and fluids.  Breathing heated mist or steam (vaporizer or shower).  Eating chicken soup or other clear broths, and maintaining good nutrition.  Getting plenty of rest.  Using gargles or lozenges for comfort.  Controlling fevers with ibuprofen or acetaminophen as directed by your caregiver.  Increasing usage of your inhaler if you have asthma. Zinc gel and zinc lozenges, taken in the first 24 hours of the common cold, can shorten the duration and lessen the severity of symptoms. Pain medicines may help with fever,  muscle aches, and throat pain. A variety of non-prescription medicines are available to treat congestion and runny nose. Your caregiver can make recommendations and may suggest nasal or lung inhalers for other symptoms.  HOME CARE INSTRUCTIONS   Only take over-the-counter or prescription medicines for pain, discomfort, or fever as directed by your caregiver.  Use a warm mist humidifier or inhale steam from a shower to increase air moisture. This may keep secretions moist and make it easier to breathe.  Drink enough water and fluids to keep your urine clear or pale yellow.  Rest as needed.  Return to work when your temperature has returned to normal or as your caregiver advises. You may need to stay home longer to avoid infecting others. You can also use a face mask and careful hand washing to prevent spread of the virus. SEEK MEDICAL CARE IF:   After the first few days, you feel you are getting worse rather than better.  You need your caregiver's advice about medicines to control symptoms.  You develop chills, worsening shortness of breath, or brown or red sputum. These may be signs of pneumonia.  You develop yellow or brown nasal discharge or pain in the face, especially when you bend forward. These may be signs of sinusitis.  You develop a fever, swollen neck glands, pain with swallowing, or white areas in the back of your throat. These may be signs of strep throat. SEEK IMMEDIATE MEDICAL CARE IF:   You have a fever.  You develop severe or persistent headache, ear pain, sinus pain, or chest pain.  You develop wheezing, a prolonged cough, cough up blood, or have a change in your usual mucus (if you have chronic lung disease).  You develop sore muscles or a stiff neck. Document Released: 01/30/2001 Document Revised: 10/29/2011 Document Reviewed: 12/08/2010 [facility] Patient Information 2013 [place], [place].

## 2012-08-18 NOTE — Assessment & Plan Note (Signed)
Likely viral. Given age, presence of cough and no tonsilar exudate, strep is unlikely.  Treat cough and sore throat with hycodan syrup. Patient to return if not improved or if worst in next 4-5 days.

## 2012-08-18 NOTE — Assessment & Plan Note (Signed)
Removal of skin tags:  7 skin tags removed.  Consent was obtained explaining risks and benefits. Time out performed.  Hemostat was used at base of stem of skin tag for a few seconds. Skin tag was then cut off with scissors. For larger one, 1 ml 2%lidocaine with epi was used for numbing. Hemostat was then used following cutting of tag at base of it.  Minimal bleeding controled with drysol. Patient tolerated procedure well.

## 2012-08-18 NOTE — Progress Notes (Signed)
Patient ID: Harry Bark    DOB: 1956/01/09, 56 y.o.   MRN: [id number] --- Subjective:  [place] is a 56 y.o.female who presents for removal of skin tags. Also complains of cold symptoms.  - cold: started 5 days ago with some mild congestion. In the last 2-3 days, she has been having sore throat and cough. Has taken benadryl cold which has helped with congestion. Overnight, she woke up with sore throat and took a tylenol with codeine which helped. She reports rhinorrhea, nasal congestion, no fever.   - skin tags: irritating and snagging clothes, located on left side of neck and right neck.    ROS: see HPI Past Medical History: reviewed and updated medications and allergies. Social History: Tobacco: none  Objective: Filed Vitals:   08/18/12 0848  BP: 126/84  Pulse: 96  Temp: 98.6 F (37 C)    Physical Examination:   General appearance - alert, well appearing, and in no distress Ears - bilateral TM's and external ear canals normal Nose - erythematous and congested nasal turbinates Mouth - mucous membranes moist, erythematous pharynx, no tonsillar exudate.  Neck - supple, no significant adenopathy Chest - clear to auscultation, no wheezes, rales or rhonchi, symmetric air entry Heart - normal rate, regular rhythm, normal S1, S2, no murmurs, rubs, clicks or gallops Skin - 4 pedunculated skin tags on right neck. 3 pedunculated skin tags on left side of neck including larger on lateral aspect of neck.

## 2012-08-18 NOTE — Assessment & Plan Note (Signed)
Controled with increase in dose of lisinopril/HCTZ from 10/12.5 to 20/12.5. Obtain BMP today to assess Cr and K.

## 2012-08-21 ENCOUNTER — Ambulatory Visit: Payer: 59

## 2012-08-25 ENCOUNTER — Telehealth: Payer: Self-pay | Admitting: *Deleted

## 2012-08-25 NOTE — Telephone Encounter (Signed)
Waiting for call back.please see Dr.Losq message. Lacey Barron, [name]

## 2012-08-25 NOTE — Telephone Encounter (Signed)
Message copied by Arlyss Repress on Mon Aug 25, 2012 12:23 PM ------      Message from: Marena Chancy E      Created: Mon Aug 25, 2012 11:51 AM       Hi Braelee Herrle,       I was wondering if you would be able to give Ms. Covington a call to let her know that her lab work was normal and that she can continue with the blood pressure medicine.       Thank you so much!      Judeth Cornfield

## 2012-08-29 ENCOUNTER — Ambulatory Visit
Admission: RE | Admit: 2012-08-29 | Discharge: 2012-08-29 | Disposition: A | Discharge status: Home / Self Care | Payer: 59 | Source: Ambulatory Visit | Attending: Family Medicine | Admitting: Family Medicine

## 2012-08-29 DIAGNOSIS — Z1231 Encounter for screening mammogram for malignant neoplasm of breast: Secondary | ICD-10-CM

## 2012-10-21 ENCOUNTER — Other Ambulatory Visit: Payer: Self-pay | Admitting: Family Medicine

## 2012-10-21 MED ORDER — LISINOPRIL-HYDROCHLOROTHIAZIDE 20-12.5 MG PO TABS: 1.0000 | ORAL_TABLET | Freq: Every day | ORAL | Status: DC

## 2012-10-21 NOTE — Telephone Encounter (Signed)
Refilling lisinopril/hctz 20/12.5mg 

## 2012-11-28 ENCOUNTER — Other Ambulatory Visit: Payer: Self-pay | Admitting: Family Medicine

## 2012-12-30 ENCOUNTER — Ambulatory Visit (INDEPENDENT_AMBULATORY_CARE_PROVIDER_SITE_OTHER): Payer: 59 | Admitting: Family Medicine

## 2012-12-30 VITALS — BP 159/98 | HR 96 | Ht 65.5 in | Wt 194.0 lb

## 2012-12-30 DIAGNOSIS — R600 Localized edema: Secondary | ICD-10-CM

## 2012-12-30 DIAGNOSIS — I10 Essential (primary) hypertension: Secondary | ICD-10-CM

## 2012-12-30 DIAGNOSIS — R609 Edema, unspecified: Secondary | ICD-10-CM

## 2012-12-30 LAB — TSH: TSH: 2.044 u[IU]/mL (ref 0.350–4.500)

## 2012-12-30 LAB — COMPREHENSIVE METABOLIC PANEL
ALT: 15 U/L (ref 0–35)
AST: 16 U/L (ref 0–37)
Albumin: 4.3 g/dL (ref 3.5–5.2)
Alkaline Phosphatase: 39 U/L (ref 39–117)
BUN: 9 mg/dL (ref 6–23)
Chloride: 106 mEq/L (ref 96–112)
Potassium: 3.7 mEq/L (ref 3.5–5.3)
Sodium: 140 mEq/L (ref 135–145)

## 2012-12-30 LAB — CBC
Hemoglobin: 10.7 g/dL — ABNORMAL LOW (ref 12.0–15.0)
MCH: 24.2 pg — ABNORMAL LOW (ref 26.0–34.0)
MCHC: 33.2 g/dL (ref 30.0–36.0)
RDW: 17.5 % — ABNORMAL HIGH (ref 11.5–15.5)

## 2012-12-30 NOTE — Progress Notes (Signed)
Patient ID: Lacey Barron, female   DOB: 02-Dec-1955, 57 y.o.   MRN: [id number]  [facility] [place] Deike M. Allisa Einspahr, MD Phone: [phone]   Subjective: HPI: Patient is a 57 y.o. female presenting to clinic today for same day appointment. Concerns today include bilateral leg swelling  1. Retaining fluid- . For last 3 weeks, intermittent leg swelling. For last 6 days, swelling every day in entire leg mostly in the left. Never had anything like this before. Only change in medication is that starting in March, started taking Claritin and OTC inhaler then switched to Zyrtec recently. Pt has history of tachycardia and was on Dilt in the past but not recently. No recent sicknesses. Edema goes down at night with rest, but last 2 days does not go all the way down. Pt endorses a 10lb weight gain in last 2 weeks. Always sleeps on 6 pillows for chronic pain. Able to walk 3 miles without dyspnea. No history of liver or kidney problems. Does have chronic anemia  ROS: + wheezing and SOB, no CP, no abd pain/N/V, no rashes  History Reviewed: Non smoker.   Objective: Office vital signs reviewed. BP 159/98  Pulse 96  Ht 5' 5.5" (1.664 m)  Wt 194 lb (87.998 kg)  BMI 31.78 kg/m2  Physical Examination:  General: Awake, alert. NAD HEENT: Atraumatic, normocephalic. MMM Neck: No masses palpated. No LAD Pulm: CTAB, no wheezes. No crackles at bases. Good air movement. Cardio: RRR, no murmurs appreciated Abdomen: Obese, +BS, soft, nontender, nondistended Extremities: 1+ pitting edema to the mid-shin. Noticeable swelling of feet. Pulses palpated. No skin changes Neuro: Grossly intact  Assessment: 57 y.o. female with leg edema  Plan: See Problem List and After Visit Summary

## 2012-12-30 NOTE — Patient Instructions (Signed)
It was nice to meet you today!  Lets start your work up for the leg swelling. I would like to see you back within the next few weeks to continue to workup. For now, keep your legs elevated. If anything gets worse or changes, please do not hesitate to come back in sooner for a check up.  Garrie Elenes M. Brodi Kari, M.D.

## 2012-12-31 ENCOUNTER — Encounter: Payer: Self-pay | Admitting: Family Medicine

## 2012-12-31 ENCOUNTER — Telehealth: Payer: Self-pay | Admitting: Family Medicine

## 2012-12-31 DIAGNOSIS — R600 Localized edema: Secondary | ICD-10-CM | POA: Insufficient documentation

## 2012-12-31 NOTE — Assessment & Plan Note (Signed)
DDx includes cardiac, kidney, liver, anemia or thyroid disease. Explained these options to patient and that this is a gradual work up but I want to rule out the bad things first. Will check CBC today for her level of anemia. Also Cmet for electrolytes, Creat, LFT and albumin. TSH today as well. BNP was also considered but not a good diagnostic number, but better for trending. Will start this work up today, and pt should elevate her legs. RTC in one week for re-evaluation. She may need echo to evaluate heart function. Pt agrees with plan. Given reasons to RTC sooner.

## 2012-12-31 NOTE — Telephone Encounter (Signed)
Left VM with patient to let her know her labs look normal except a low hemoglobin. Letter sent to patient. She will follow up on May 28.  Devon Kingdon M. Jayce Kainz, M.D. 12/31/2012 8:27 AM

## 2013-01-14 ENCOUNTER — Ambulatory Visit (INDEPENDENT_AMBULATORY_CARE_PROVIDER_SITE_OTHER): Payer: 59 | Admitting: Family Medicine

## 2013-01-14 ENCOUNTER — Encounter: Payer: Self-pay | Admitting: Family Medicine

## 2013-01-14 VITALS — BP 164/89 | HR 73 | Temp 99.1°F | Ht 65.5 in | Wt 194.0 lb

## 2013-01-14 DIAGNOSIS — R600 Localized edema: Secondary | ICD-10-CM

## 2013-01-14 DIAGNOSIS — R609 Edema, unspecified: Secondary | ICD-10-CM

## 2013-01-14 DIAGNOSIS — I10 Essential (primary) hypertension: Secondary | ICD-10-CM

## 2013-01-14 NOTE — Assessment & Plan Note (Signed)
Resolved. No further work up at this time. Continue iron for anemia. RTC if edema returns.

## 2013-01-14 NOTE — Assessment & Plan Note (Signed)
BP elevated for last 2 office visits. Patient states readings are better at home and she had not taken medication this morning. At this time, I will not make any changes. Continue Lisinopril-HCTZ daily. Decrease salt in diet and keep track of readings at home. RTC in 1 month for recheck with PCP and changes in medication if needed. Pt agrees.

## 2013-01-14 NOTE — Patient Instructions (Signed)
It was nice to see you today, I am glad you are feeling better!  Take your home iron pill. Check your blood pressure at home, if it stays high let us know. Otherwise, we will see you in about a month to check on your blood pressure.  Sharone Picchi M. Riely Oetken, M.D.

## 2013-01-14 NOTE — Progress Notes (Signed)
Patient ID: Leora Platt, female   DOB: Mar 16, 1956, 57 y.o.   MRN: [id number]  [facility] Journi Moffa M. Kaliegh Willadsen, MD Phone: [phone]   Subjective: HPI: Patient is a 57 y.o. female presenting to clinic today for follow up for edema. She has no concerns today  1. Edema- Was seen as a same day visit for leg swelling one week ago, but that resolved on its own and hasn't come back. Labs normal except anemia, which she knew she had. She is taking iron supplements for that. Patient states she has no further concerns about her edema.  2. HTN- Elevated today at 164/89. Home readings are lower, typically in 120-130's. She took today's dose later than usual. On Lisinopril-HCTZ with no problems. Pt states she was on 2 pills in the past but that was changed by her PCP. She denies any symptoms today.  History Reviewed: Never smoker.  ROS: Please see HPI above.  Objective: Office vital signs reviewed. BP 164/89  Pulse 73  Temp(Src) 99.1 F (37.3 C) (Oral)  Ht 5' 5.5" (1.664 m)  Wt 194 lb (87.998 kg)  BMI 31.78 kg/m2  LMP 01/05/2013  Physical Examination:  General: Awake, alert. NAD. Very pleasant HEENT: Atraumatic, normocephalic. MMM Pulm: CTAB, no wheezes Cardio: RRR, no murmurs appreciated Abdomen: soft, nontender Extremities: No edema appreciated at all. Good pulses and cap refill Neuro: Grossly intact  Assessment: 57 y.o. female follow up appointment  Plan: See Problem List and After Visit Summary

## 2013-10-14 ENCOUNTER — Ambulatory Visit (HOSPITAL_COMMUNITY)
Admission: RE | Admit: 2013-10-14 | Discharge: 2013-10-14 | Disposition: A | Discharge status: Home / Self Care | Payer: No Typology Code available for payment source | Source: Ambulatory Visit | Attending: Family Medicine | Admitting: Family Medicine

## 2013-10-14 ENCOUNTER — Ambulatory Visit (INDEPENDENT_AMBULATORY_CARE_PROVIDER_SITE_OTHER): Payer: 59 | Admitting: Emergency Medicine

## 2013-10-14 ENCOUNTER — Encounter: Payer: Self-pay | Admitting: Emergency Medicine

## 2013-10-14 VITALS — BP 171/105 | HR 98 | Temp 98.4°F | Wt 185.0 lb

## 2013-10-14 DIAGNOSIS — M542 Cervicalgia: Secondary | ICD-10-CM

## 2013-10-14 DIAGNOSIS — S134XXA Sprain of ligaments of cervical spine, initial encounter: Secondary | ICD-10-CM

## 2013-10-14 DIAGNOSIS — I10 Essential (primary) hypertension: Secondary | ICD-10-CM

## 2013-10-14 DIAGNOSIS — S139XXA Sprain of joints and ligaments of unspecified parts of neck, initial encounter: Secondary | ICD-10-CM

## 2013-10-14 MED ORDER — TRAMADOL-ACETAMINOPHEN 37.5-325 MG PO TABS: 1.0000 | ORAL_TABLET | Freq: Four times a day (QID) | ORAL | Status: DC | PRN

## 2013-10-14 MED ORDER — CYCLOBENZAPRINE HCL 10 MG PO TABS: 10.0000 mg | ORAL_TABLET | Freq: Three times a day (TID) | ORAL | Status: DC | PRN

## 2013-10-14 MED ORDER — IBUPROFEN 800 MG PO TABS
800.0000 mg | ORAL_TABLET | Freq: Three times a day (TID) | ORAL | Status: DC | PRN
Start: 2013-10-14 — End: 2014-02-17
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
## 2013-10-14 NOTE — Assessment & Plan Note (Signed)
Elevated today. Likely from stress/pain. Discussed f/u with PCP for medication titration.
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
## 2013-10-14 NOTE — Assessment & Plan Note (Signed)
No vertebral tenderness.  Normal strength.  Will check cervical films to be on the safe side. Flexeril, ibuprofen TID for next few days, then as needed. Ultracet to use prn for pain. Discussed time course - will take several weeks to completely resolve. F/u in 2 weeks if not improved.

## 2013-10-29 ENCOUNTER — Encounter: Payer: Self-pay | Admitting: Family Medicine

## 2013-10-29 ENCOUNTER — Ambulatory Visit (INDEPENDENT_AMBULATORY_CARE_PROVIDER_SITE_OTHER): Payer: 59 | Admitting: Family Medicine

## 2013-10-29 VITALS — BP 178/99 | HR 98 | Ht 65.5 in | Wt 187.0 lb

## 2013-10-29 DIAGNOSIS — S134XXA Sprain of ligaments of cervical spine, initial encounter: Secondary | ICD-10-CM

## 2013-10-29 DIAGNOSIS — S139XXA Sprain of joints and ligaments of unspecified parts of neck, initial encounter: Secondary | ICD-10-CM

## 2013-10-29 DIAGNOSIS — I10 Essential (primary) hypertension: Secondary | ICD-10-CM

## 2013-10-29 MED ORDER — LISINOPRIL-HYDROCHLOROTHIAZIDE 20-25 MG PO TABS: 1.0000 | ORAL_TABLET | Freq: Every day | ORAL | Status: DC

## 2013-10-29 MED ORDER — CYCLOBENZAPRINE HCL 10 MG PO TABS: 10.0000 mg | ORAL_TABLET | Freq: Three times a day (TID) | ORAL | Status: DC | PRN

## 2013-10-29 NOTE — Patient Instructions (Signed)
Cervical Sprain A cervical sprain is an injury in the neck in which the strong, fibrous tissues (ligaments) that connect your neck bones stretch or tear. Cervical sprains can range from mild to severe. Severe cervical sprains can cause the neck vertebrae to be unstable. This can lead to damage of the spinal cord and can result in serious nervous system problems. The amount of time it takes for a cervical sprain to get better depends on the cause and extent of the injury. Most cervical sprains heal in 1 to 3 weeks. CAUSES  Severe cervical sprains may be caused by:   Contact sport injuries (such as from football, rugby, wrestling, hockey, auto racing, gymnastics, diving, martial arts, or boxing).   Motor vehicle collisions.   Whiplash injuries. This is an injury from a sudden forward-and backward whipping movement of the head and neck.  Falls.  Mild cervical sprains may be caused by:   Being in an awkward position, such as while cradling a telephone between your ear and shoulder.   Sitting in a chair that does not offer proper support.   Working at a poorly designed computer station.   Looking up or down for long periods of time.  SYMPTOMS   Pain, soreness, stiffness, or a burning sensation in the front, back, or sides of the neck. This discomfort may develop immediately after the injury or slowly, 24 hours or more after the injury.   Pain or tenderness directly in the middle of the back of the neck.   Shoulder or upper back pain.   Limited ability to move the neck.   Headache.   Dizziness.   Weakness, numbness, or tingling in the hands or arms.   Muscle spasms.   Difficulty swallowing or chewing.   Tenderness and swelling of the neck.  DIAGNOSIS  Most of the time your health care provider can diagnose a cervical sprain by taking your history and doing a physical exam. Your health care provider will ask about previous neck injuries and any known neck  problems, such as arthritis in the neck. X-rays may be taken to find out if there are any other problems, such as with the bones of the neck. Other tests, such as a CT scan or MRI, may also be needed.  TREATMENT  Treatment depends on the severity of the cervical sprain. Mild sprains can be treated with rest, keeping the neck in place (immobilization), and pain medicines. Severe cervical sprains are immediately immobilized. Further treatment is done to help with pain, muscle spasms, and other symptoms and may include:  Medicines, such as pain relievers, numbing medicines, or muscle relaxants.   Physical therapy. This may involve stretching exercises, strengthening exercises, and posture training. Exercises and improved posture can help stabilize the neck, strengthen muscles, and help stop symptoms from returning.  HOME CARE INSTRUCTIONS   Put ice on the injured area.   Put ice in a plastic bag.   Place a towel between your skin and the bag.   Leave the ice on for 15 20 minutes, 3 4 times a day.   If your injury was severe, you may have been given a cervical collar to wear. A cervical collar is a two-piece collar designed to keep your neck from moving while it heals.  Do not remove the collar unless instructed by your health care provider.  If you have long hair, keep it outside of the collar.  Ask your health care provider before making any adjustments to your collar.   Minor adjustments may be required over time to improve comfort and reduce pressure on your chin or on the back of your head.  Ifyou are allowed to remove the collar for cleaning or bathing, follow your health care provider's instructions on how to do so safely.  Keep your collar clean by wiping it with mild soap and water and drying it completely. If the collar you have been given includes removable pads, remove them every 1 2 days and hand wash them with soap and water. Allow them to air dry. They should be completely  dry before you wear them in the collar.  If you are allowed to remove the collar for cleaning and bathing, wash and dry the skin of your neck. Check your skin for irritation or sores. If you see any, tell your health care provider.  Do not drive while wearing the collar.   Only take over-the-counter or prescription medicines for pain, discomfort, or fever as directed by your health care provider.   Keep all follow-up appointments as directed by your health care provider.   Keep all physical therapy appointments as directed by your health care provider.   Make any needed adjustments to your workstation to promote good posture.   Avoid positions and activities that make your symptoms worse.   Warm up and stretch before being active to help prevent problems.  SEEK MEDICAL CARE IF:   Your pain is not controlled with medicine.   You are unable to decrease your pain medicine over time as planned.   Your activity level is not improving as expected.  SEEK IMMEDIATE MEDICAL CARE IF:   You develop any bleeding.  You develop stomach upset.  You have signs of an allergic reaction to your medicine.   Your symptoms get worse.   You develop new, unexplained symptoms.   You have numbness, tingling, weakness, or paralysis in any part of your body.  MAKE SURE YOU:   Understand these instructions.  Will watch your condition.  Will get help right away if you are not doing well or get worse. Document Released: 06/03/2007 Document Revised: 05/27/2013 Document Reviewed: 02/11/2013 [employer] Patient Information 2014 [employer].  

## 2013-10-30 NOTE — Assessment & Plan Note (Signed)
Currently not well controlled. Suspect ibuprofen as well as pain.  Since this has been persistent in the last couple of visits, will increase her lisinopril/hctz 20/12.5 to lisinopril/hctz 20/25
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
## 2014-02-17 ENCOUNTER — Other Ambulatory Visit: Payer: Self-pay | Admitting: Emergency Medicine

## 2014-02-18 ENCOUNTER — Ambulatory Visit (INDEPENDENT_AMBULATORY_CARE_PROVIDER_SITE_OTHER): Payer: 59 | Admitting: Family Medicine

## 2014-02-18 VITALS — BP 130/84 | HR 95 | Temp 98.1°F | Resp 18 | Wt 191.0 lb

## 2014-02-18 DIAGNOSIS — R519 Headache, unspecified: Secondary | ICD-10-CM

## 2014-02-18 DIAGNOSIS — R51 Headache: Secondary | ICD-10-CM

## 2014-02-18 DIAGNOSIS — R6884 Jaw pain: Secondary | ICD-10-CM

## 2014-02-18 LAB — POCT SEDIMENTATION RATE: POCT SED RATE: 2 mm/h (ref 0–22)

## 2014-02-18 LAB — C-REACTIVE PROTEIN

## 2014-02-18 NOTE — Patient Instructions (Signed)
Things to look out for: Any changes in vision including double vision, curtain moving over eye, dots/stars that last more than just standing up Severe pain that is worse than before  Nasal drainage, call the clinic and we may prescribe antibiotics for you.  Numbness/tingling/weakness in arms and legs

## 2014-02-18 NOTE — Progress Notes (Signed)
Patient ID: Merril Isakson, female   DOB: November 09, 1955, 58 y.o.   MRN: [id number]   Subjective:    Patient ID: Elvina Bosch, female    DOB: 1955/11/07, 58 y.o.   MRN: [id number]  HPI  CC: Pain under left eye, temple  # Pain under left eye:  Started Saturday. First noticed her eye was watery, then became painful. Pain is constant, will radiate up toward temple and down near jaw but primarily located just below eye.   Ibuprofen helps a little  Denies any swelling or redness to area  Checked BP on Tuesday, measured 188 over "something".  Denies any nasal discharge or congestion  Had 3 sinus infections over past 3 years  Has never had jaw problems before  No rheumatoid arthritis, psoriasis, or other autoimmune diseases in family ROS: No changes in vision, hearing/tinnitus, no fevers/chills, no CP or SOB, no nausea/vomiting.  Review of Systems   See HPI for ROS. Objective:  BP 152/93  Pulse 95  Temp(Src) 98.1 F (36.7 C) (Oral)  Resp 18  Wt 191 lb (86.637 kg)  SpO2 97% Repeat BP 130/84  General: NAD HEENT: PERRL, EOMI, sclera not injected. Normal appearing optic discs bilaterally. Tender to palpation over left lower orbit/maxillary sinus, left temple. Mild pain with clenching jaw, no major clicking noted. Both TMs are pearly gray without erythema or bulging, normal light reflex. Cardiac: RRR, normal heart sounds, no murmurs. 2+ radial and PT pulses bilaterally Respiratory: CTAB, normal effort Extremities: no edema or cyanosis. WWP. Skin: warm and dry, no rashes noted Neuro: alert and oriented, no focal deficits. CN2-12 normal. Strength 5/5 bilaterally grip, lower leg extension. Grossly intact sensation to light touch over hands, calves.      Assessment & Plan:  See Problem List Documentation

## 2014-02-18 NOTE — Assessment & Plan Note (Signed)
Unclear etiology... Discussed with Drs. Fletke and McDiarmid, low suspicion for more urgent diagnoses like Temporal arteritis. No red flags for preseptal infection, no evidence of cellulitis. Differential including: sinus infection, sinus headache, tension headache, TMJ. P: continue ibuprofen/tylenol/aleve for pain; will get ESR (normal, 2) and CRP. Discussed red flag symptoms (any vision changes, worsening pain, weakness/loss of sensation or other stroke symptoms) and to return.

## 2014-02-18 NOTE — Assessment & Plan Note (Addendum)
Unclear etiology... Discussed with Drs. Fletke and McDiarmid, low suspicion for more urgent diagnoses like Temporal arteritis. No red flags for preseptal infection, no evidence of cellulitis. Differential including: sinus infection, sinus headache, tension headache, TMJ. P: continue ibuprofen/tylenol/aleve for pain; will get ESR (normal, 2) and CRP. Discussed red flag symptoms (any vision changes, worsening pain, weakness/loss of sensation or other stroke symptoms) and to return. F/u in 2 weeks or sooner if unresolved.

## 2014-02-24 ENCOUNTER — Encounter: Payer: Self-pay | Admitting: Family Medicine

## 2014-04-06 ENCOUNTER — Encounter: Payer: Self-pay | Admitting: Family Medicine

## 2014-04-06 ENCOUNTER — Ambulatory Visit (HOSPITAL_COMMUNITY)
Admission: RE | Admit: 2014-04-06 | Discharge: 2014-04-06 | Disposition: A | Discharge status: Home / Self Care | Payer: 59 | Source: Ambulatory Visit | Attending: Family Medicine | Admitting: Family Medicine

## 2014-04-06 ENCOUNTER — Ambulatory Visit (INDEPENDENT_AMBULATORY_CARE_PROVIDER_SITE_OTHER): Payer: 59 | Admitting: Family Medicine

## 2014-04-06 VITALS — BP 150/86 | HR 119 | Temp 101.7°F | Wt 184.0 lb

## 2014-04-06 DIAGNOSIS — R059 Cough, unspecified: Secondary | ICD-10-CM | POA: Insufficient documentation

## 2014-04-06 DIAGNOSIS — R05 Cough: Secondary | ICD-10-CM | POA: Diagnosis present

## 2014-04-06 DIAGNOSIS — R079 Chest pain, unspecified: Secondary | ICD-10-CM | POA: Diagnosis not present

## 2014-04-06 LAB — CBC WITH DIFFERENTIAL/PLATELET
BASOS ABS: 0 10*3/uL (ref 0.0–0.1)
Basophils Relative: 0 % (ref 0–1)
Eosinophils Absolute: 0.3 10*3/uL (ref 0.0–0.7)
Eosinophils Relative: 3 % (ref 0–5)
HEMATOCRIT: 28.1 % — AB (ref 36.0–46.0)
Hemoglobin: 9.7 g/dL — ABNORMAL LOW (ref 12.0–15.0)
LYMPHS PCT: 17 % (ref 12–46)
Lymphs Abs: 1.5 10*3/uL (ref 0.7–4.0)
MCH: 24.3 pg — ABNORMAL LOW (ref 26.0–34.0)
MCHC: 34.5 g/dL (ref 30.0–36.0)
MCV: 70.4 fL — ABNORMAL LOW (ref 78.0–100.0)
MONO ABS: 0.8 10*3/uL (ref 0.1–1.0)
Monocytes Relative: 9 % (ref 3–12)
NEUTROS ABS: 6.1 10*3/uL (ref 1.7–7.7)
NEUTROS PCT: 71 % (ref 43–77)
PLATELETS: 228 10*3/uL (ref 150–400)
RBC: 3.99 MIL/uL (ref 3.87–5.11)
RDW: 18.3 % — AB (ref 11.5–15.5)
WBC: 8.6 10*3/uL (ref 4.0–10.5)

## 2014-04-06 MED ORDER — ALBUTEROL SULFATE HFA 108 (90 BASE) MCG/ACT IN AERS
2.0000 | INHALATION_SPRAY | RESPIRATORY_TRACT | Status: DC | PRN
Start: 2014-04-06 — End: 2015-04-13

## 2014-04-06 NOTE — Patient Instructions (Addendum)
You likely have the flu or another similar viral infection. This should get better on its own and usually takes about 10 days to get out of one's system. However, I would like to get labs and a chest xray to evaluate for a pneumonia. Stay hydrated, get plenty of rest, and wash hands. Take albuterol every 4 hours for the next 2 days, then every 6 hours as needed. You can take tylenol or ibuprofen every 4-6 hours as needed for fever/achiness. Do not take more than 3-4 g acetaminophen daily (including tylenol and tylenol PM). Seek immediate care if you have trouble breathing, dizziness, fainting, or other concerns. Follow up with [place] in 3-5 days if symptoms are not improving.  Best,  Hilton Sinclair, MD

## 2014-04-07 ENCOUNTER — Telehealth: Payer: Self-pay | Admitting: Family Medicine

## 2014-04-07 MED ORDER — AZITHROMYCIN 250 MG PO TABS: ORAL_TABLET | ORAL | Status: DC

## 2014-04-07 NOTE — Telephone Encounter (Signed)
Left message on home voicemail for patient to call clinic. When she calls, please let her know her x-ray is indicative of possible pneumonia. She should take azithromycin for 5 days and follow up if no improvement by late this week or seek immediate care if symptoms worsen.  Hilton Sinclair, MD

## 2014-04-07 NOTE — Telephone Encounter (Signed)
LMOVM requesting callback from pt again. Fleeger, [name]

## 2014-04-08 ENCOUNTER — Telehealth: Payer: Self-pay | Admitting: Family Medicine

## 2014-04-08 NOTE — Telephone Encounter (Signed)
See phone note from 04/08/14.  Pt returned call. [name], [name]

## 2014-04-08 NOTE — Assessment & Plan Note (Signed)
Most likely viral infection, though crackles on exam and fever raise concern for pneumonia. Mild tachycardia likely related to fever. Rate normal on exam. Rib pain likely muscle strain from cough. - CXR to eval for PNA; CBC - Push liquids and rest. Handwashing. - Albuterol q4 hours scheduled for 1-2 days with wheezing, then Q6 hours prn. - Tylenol or ibuprofen PRN fever. - Return precautions reviewed, including reasons to go to ED or follow up in clinic.

## 2014-04-08 NOTE — Progress Notes (Signed)
Subjective:   CC: Cough and flu symptoms  HPI:   Ms Lacey Barron is here to follow up on flu-like symptoms. She reports ~1 week of feeling sick with phlegm, chills, body aches, throat burning, rib pain, purulent sputum, chest tightness, feverishness, sweats when fever breaks, decreased appetite, gagging with coughing, and feeling like she has the flu. She denies sick contacts, sinus pain, dizziness/syncope, nausea, or vomiting. She endorses chest burning sensation. She does work in the hospital. She tried zyrtec, dayquil, and sudafed, which did not help. She does not report substernal chest pain or dyspnea. She is tolerating liquid PO.    Review of Systems - Per HPI.   SH: Denies tobacco, drugs, or alcohol use Medications: Reviewed    Objective:  Physical Exam BP 150/86  Pulse 119  Temp(Src) 101.7 F (38.7 C) (Oral)  Wt 184 lb (83.462 kg)  SpO2 96% GEN: NAD, uncomfortable-appearing CV: RRR, no m/r/g PULM: Audible wheezing throughout, left-sided crackles throughout, normal effort, occasional cough HEENT: o/p clear with faint punctate lesions at soft palate; TMs clear SKIN: warm and sweating, no rash or cyanosis    Assessment:     Lacey Barron is a 58 y.o. female with h/o HTN, maxillary pain, and bilateral leg edema here for cough/flu symptoms.    Plan:     Cough and flu symptoms Most likely viral infection, though crackles on exam and fever raise concern for pneumonia. Mild tachycardia likely related to fever. Rate normal on exam. Rib pain likely muscle strain from cough. - CXR to eval for PNA; CBC - Push liquids and rest. Handwashing. - Albuterol q4 hours scheduled for 1-2 days with wheezing, then Q6 hours prn. - Tylenol or ibuprofen PRN fever. - Return precautions reviewed, including reasons to go to ED or follow up in clinic.    Hilton Sinclair, MD [place]

## 2014-04-08 NOTE — Telephone Encounter (Signed)
Pt called back and was told the message about the x-rays and her medication at the pharmacy. She picked up the medication and will call back in 5 days if not better. [email or web address]

## 2014-04-15 ENCOUNTER — Encounter: Payer: Self-pay | Admitting: Family Medicine

## 2014-04-15 MED ORDER — GUAIFENESIN-DM 100-10 MG/5ML PO SYRP: 5.0000 mL | ORAL_SOLUTION | ORAL | Status: DC | PRN

## 2014-04-15 MED ORDER — DM-GUAIFENESIN ER 30-600 MG PO TB12: 1.0000 | ORAL_TABLET | Freq: Every day | ORAL | Status: DC

## 2014-04-15 NOTE — Telephone Encounter (Signed)
Called patient to discuss her continued night time cough. Explained that unfortunately with these kinds of infections, the cough and wheezing can last several weeks. Recommended that she continue using the albuterol inhaler. After speaking with pharmacy, I sent in a Rx for Mucinex DM qhs as it has less effect on BP when compared to other antitussives.  Patient aware that symptoms may continue to linger and that she should come back in approximately 4 weeks for a follow up CXR. Additionally, RTC precautions were given and patient voiced understanding.  Kathrine Cords, MD

## 2014-04-20 ENCOUNTER — Encounter: Payer: Self-pay | Admitting: Gastroenterology

## 2014-05-18 ENCOUNTER — Other Ambulatory Visit: Payer: Self-pay | Admitting: *Deleted

## 2014-05-19 MED ORDER — LISINOPRIL-HYDROCHLOROTHIAZIDE 20-25 MG PO TABS: 1.0000 | ORAL_TABLET | Freq: Every day | ORAL | Status: DC

## 2014-05-28 ENCOUNTER — Telehealth: Payer: Self-pay | Admitting: Family Medicine

## 2014-05-28 ENCOUNTER — Encounter (HOSPITAL_COMMUNITY): Payer: Self-pay | Admitting: Emergency Medicine

## 2014-05-28 ENCOUNTER — Emergency Department (HOSPITAL_COMMUNITY)
Admission: EM | Admit: 2014-05-28 | Discharge: 2014-05-28 | Disposition: A | Discharge status: Home / Self Care | Payer: 59 | Source: Home / Self Care | Attending: Family Medicine | Admitting: Family Medicine

## 2014-05-28 DIAGNOSIS — I10 Essential (primary) hypertension: Secondary | ICD-10-CM

## 2014-05-28 DIAGNOSIS — R42 Dizziness and giddiness: Secondary | ICD-10-CM

## 2014-05-28 LAB — POCT I-STAT, CHEM 8
BUN: 8 mg/dL (ref 6–23)
CHLORIDE: 99 meq/L (ref 96–112)
Calcium, Ion: 1.2 mmol/L (ref 1.12–1.23)
Creatinine, Ser: 0.9 mg/dL (ref 0.50–1.10)
Glucose, Bld: 102 mg/dL — ABNORMAL HIGH (ref 70–99)
HEMATOCRIT: 41 % (ref 36.0–46.0)
HEMOGLOBIN: 13.9 g/dL (ref 12.0–15.0)
POTASSIUM: 3.7 meq/L (ref 3.7–5.3)
SODIUM: 142 meq/L (ref 137–147)
TCO2: 29 mmol/L (ref 0–100)

## 2014-05-28 MED ORDER — AMLODIPINE BESYLATE 5 MG PO TABS: 5.0000 mg | ORAL_TABLET | Freq: Every day | ORAL | Status: DC

## 2014-05-28 NOTE — ED Provider Notes (Signed)
Lacey Barron is a 58 y.o. female who presents to Urgent Care today for elevated blood pressure had pressure and vague dizziness. Symptoms present for about 4 days. No weakness or numbness or loss of function. Blood pressures typically well-controlled with lisinopril/hydrochlorothiazide. Patient notes that her blood pressure has been found to be much more elevated than typical in the 935 systolic range with her symptoms. She currently feels reasonably well. No weakness or numbness or loss of function. No trouble walking or standing. No trouble eating drinking swallowing or breathing.   History reviewed. No pertinent past medical history. History  Substance Use Topics  . Smoking status: Never Smoker   . Smokeless tobacco: Not on file  . Alcohol Use: No   ROS as above Medications: No current facility-administered medications for this encounter.   Current Outpatient Prescriptions  Medication Sig Dispense Refill  . lisinopril-hydrochlorothiazide (PRINZIDE,ZESTORETIC) 20-25 MG per tablet Take 1 tablet by mouth daily.  30 tablet  1  . albuterol (PROVENTIL HFA;VENTOLIN HFA) 108 (90 BASE) MCG/ACT inhaler Inhale 2 puffs into the lungs every 4 (four) hours as needed for wheezing or shortness of breath.  1 each  6  . amLODipine (NORVASC) 5 MG tablet Take 1 tablet (5 mg total) by mouth daily.  30 tablet  1  . azithromycin (ZITHROMAX) 250 MG tablet Take 250 mg for 5 days.  5 tablet  0  . cyclobenzaprine (FLEXERIL) 10 MG tablet Take 1 tablet (10 mg total) by mouth 3 (three) times daily as needed for muscle spasms.  60 tablet  1  . dextromethorphan-guaiFENesin (MUCINEX DM) 30-600 MG per 12 hr tablet Take 1 tablet by mouth at bedtime.  15 tablet  0  . ferrous sulfate 325 (65 FE) MG tablet Take 1 tablet (325 mg total) by mouth 2 (two) times daily.  60 tablet  5  . fluticasone (FLONASE) 50 MCG/ACT nasal spray Place 2 sprays into the nose daily.  16 g  6  . ibuprofen (ADVIL,MOTRIN) 800 MG tablet TAKE 1  TABLET BY MOUTH 3 TIMES DAILY AS NEEDED FOR PAIN  30 tablet  PRN  . MATZIM LA 240 MG 24 hr tablet TAKE 1 TABLET (240 MG TOTAL) BY MOUTH DAILY.  30 tablet  3  . traMADol-acetaminophen (ULTRACET) 37.5-325 MG per tablet Take 1 tablet by mouth every 6 (six) hours as needed. Take 1 to 2 tablets three times a day as needed pain  30 tablet  1    Exam:  BP 139/84  Pulse 95  Temp(Src) 98.5 F (36.9 C) (Oral)  Resp 16  SpO2 97% Gen: Well NAD HEENT: EOMI,  MMM PERRLA. Unable to adequately visualize the fundus bilaterally. Lungs: Normal work of breathing. CTABL Heart: RRR no MRG Abd: NABS, Soft. Nondistended, Nontender Exts: Brisk capillary refill, warm and well perfused.  Neuro: Alert and oriented cranial she did 12 are intact normal coordination strength sensation balance and reflexes.  Results for orders placed during the hospital encounter of 05/28/14 (from the past 24 hour(s))  POCT I-STAT, CHEM 8     Status: Abnormal   Collection Time    05/28/14  2:09 PM      Result Value Ref Range   Sodium 142  137 - 147 mEq/L   Potassium 3.7  3.7 - 5.3 mEq/L   Chloride 99  96 - 112 mEq/L   BUN 8  6 - 23 mg/dL   Creatinine, Ser 0.90  0.50 - 1.10 mg/dL   Glucose, Bld 102 (*) 70 -  99 mg/dL   Calcium, Ion 1.20  1.12 - 1.23 mmol/L   TCO2 29  0 - 100 mmol/L   Hemoglobin 13.9  12.0 - 15.0 g/dL   HCT 41.0  36.0 - 46.0 %   No results found.  Assessment and Plan: 58 y.o. female with mild elevated blood pressure associated with headache head pressure and dizziness. Patient is neurologically well currently. Plan to increase blood pressure medication by adding 5 mg of amlodipine and followup with primary care provider.  Discussed warning signs or symptoms. Please see discharge instructions. Patient expresses understanding.     Gregor Hams, MD 05/28/14 [phone]

## 2014-05-28 NOTE — Telephone Encounter (Signed)
Pt needs to be seen for bp problems, advised pt we were full today, she would like to speak with triage nurse for advice

## 2014-05-28 NOTE — ED Notes (Signed)
C/o constant HA and feeling dizzy Concerned it maybe her HTN Has been checking her BP at home and at work; has been running high Denies SOB, CP, weakness, diaphoresis  Alert, no signs of acute distress.

## 2014-05-28 NOTE — Discharge Instructions (Signed)
Thank you for coming in today. Call or go to the emergency room if you get worse, have trouble breathing, have chest pains, or palpitations.  Go to the emergency room if your headache becomes excruciating or you have weakness or numbness or uncontrolled vomiting.  Follow up with your doctor.   Dizziness Dizziness is a common problem. It is a feeling of unsteadiness or light-headedness. You may feel like you are about to faint. Dizziness can lead to injury if you stumble or fall. A person of any age group can suffer from dizziness, but dizziness is more common in older adults. CAUSES  Dizziness can be caused by many different things, including:  Middle ear problems.  Standing for too long.  Infections.  An allergic reaction.  Aging.  An emotional response to something, such as the sight of blood.  Side effects of medicines.  Tiredness.  Problems with circulation or blood pressure.  Excessive use of alcohol or medicines, or illegal drug use.  Breathing too fast (hyperventilation).  An irregular heart rhythm (arrhythmia).  A low red blood cell count (anemia).  Pregnancy.  Vomiting, diarrhea, fever, or other illnesses that cause body fluid loss (dehydration).  Diseases or conditions such as Parkinson's disease, high blood pressure (hypertension), diabetes, and thyroid problems.  Exposure to extreme heat. DIAGNOSIS  Your health care provider will ask about your symptoms, perform a physical exam, and perform an electrocardiogram (ECG) to record the electrical activity of your heart. Your health care provider may also perform other heart or blood tests to determine the cause of your dizziness. These may include:  Transthoracic echocardiogram (TTE). During echocardiography, sound waves are used to evaluate how blood flows through your heart.  Transesophageal echocardiogram (TEE).  Cardiac monitoring. This allows your health care provider to monitor your heart rate and rhythm  in real time.  Holter monitor. This is a portable device that records your heartbeat and can help diagnose heart arrhythmias. It allows your health care provider to track your heart activity for several days if needed.  Stress tests by exercise or by giving medicine that makes the heart beat faster. TREATMENT  Treatment of dizziness depends on the cause of your symptoms and can vary greatly. HOME CARE INSTRUCTIONS   Drink enough fluids to keep your urine clear or pale yellow. This is especially important in very hot weather. In older adults, it is also important in cold weather.  Take your medicine exactly as directed if your dizziness is caused by medicines. When taking blood pressure medicines, it is especially important to get up slowly.  Rise slowly from chairs and steady yourself until you feel okay.  In the morning, first sit up on the side of the bed. When you feel okay, stand slowly while holding onto something until you know your balance is fine.  Move your legs often if you need to stand in one place for a long time. Tighten and relax your muscles in your legs while standing.  Have someone stay with you for 1-2 days if dizziness continues to be a problem. Do this until you feel you are well enough to stay alone. Have the person call your health care provider if he or she notices changes in you that are concerning.  Do not drive or use heavy machinery if you feel dizzy.  Do not drink alcohol. SEEK IMMEDIATE MEDICAL CARE IF:   Your dizziness or light-headedness gets worse.  You feel nauseous or vomit.  You have problems talking, walking,  or using your arms, hands, or legs.  You feel weak.  You are not thinking clearly or you have trouble forming sentences. It may take a friend or family member to notice this.  You have chest pain, abdominal pain, shortness of breath, or sweating.  Your vision changes.  You notice any bleeding.  You have side effects from medicine  that seems to be getting worse rather than better. MAKE SURE YOU:   Understand these instructions.  Will watch your condition.  Will get help right away if you are not doing well or get worse. Document Released: 01/30/2001 Document Revised: 08/11/2013 Document Reviewed: 02/23/2011 [facility] Patient Information 2015 [place], [place]. This information is not intended to replace advice given to you by your health care provider. Make sure you discuss any questions you have with your health care provider.  Hypertension Hypertension, commonly called high blood pressure, is when the force of blood pumping through your arteries is too strong. Your arteries are the blood vessels that carry blood from your heart throughout your body. A blood pressure reading consists of a higher number over a lower number, such as 110/72. The higher number (systolic) is the pressure inside your arteries when your heart pumps. The lower number (diastolic) is the pressure inside your arteries when your heart relaxes. Ideally you want your blood pressure below 120/80. Hypertension forces your heart to work harder to pump blood. Your arteries may become narrow or stiff. Having hypertension puts you at risk for heart disease, stroke, and other problems.  RISK FACTORS Some risk factors for high blood pressure are controllable. Others are not.  Risk factors you cannot control include:   Race. You may be at higher risk if you are African American.  Age. Risk increases with age.  Gender. Men are at higher risk than women before age 56 years. After age 45, women are at higher risk than men. Risk factors you can control include:  Not getting enough exercise or physical activity.  Being overweight.  Getting too much fat, sugar, calories, or salt in your diet.  Drinking too much alcohol. SIGNS AND SYMPTOMS Hypertension does not usually cause signs or symptoms. Extremely high blood pressure (hypertensive crisis) may cause  headache, anxiety, shortness of breath, and nosebleed. DIAGNOSIS  To check if you have hypertension, your health care provider will measure your blood pressure while you are seated, with your arm held at the level of your heart. It should be measured at least twice using the same arm. Certain conditions can cause a difference in blood pressure between your right and left arms. A blood pressure reading that is higher than normal on one occasion does not mean that you need treatment. If one blood pressure reading is high, ask your health care provider about having it checked again. TREATMENT  Treating high blood pressure includes making lifestyle changes and possibly taking medicine. Living a healthy lifestyle can help lower high blood pressure. You may need to change some of your habits. Lifestyle changes may include:  Following the DASH diet. This diet is high in fruits, vegetables, and whole grains. It is low in salt, red meat, and added sugars.  Getting at least 2 hours of brisk physical activity every week.  Losing weight if necessary.  Not smoking.  Limiting alcoholic beverages.  Learning ways to reduce stress. If lifestyle changes are not enough to get your blood pressure under control, your health care provider may prescribe medicine. You may need to take more than  one. Work closely with your health care provider to understand the risks and benefits. HOME CARE INSTRUCTIONS  Have your blood pressure rechecked as directed by your health care provider.   Take medicines only as directed by your health care provider. Follow the directions carefully. Blood pressure medicines must be taken as prescribed. The medicine does not work as well when you skip doses. Skipping doses also puts you at risk for problems.   Do not smoke.   Monitor your blood pressure at home as directed by your health care provider. SEEK MEDICAL CARE IF:   You think you are having a reaction to medicines  taken.  You have recurrent headaches or feel dizzy.  You have swelling in your ankles.  You have trouble with your vision. SEEK IMMEDIATE MEDICAL CARE IF:  You develop a severe headache or confusion.  You have unusual weakness, numbness, or feel faint.  You have severe chest or abdominal pain.  You vomit repeatedly.  You have trouble breathing. MAKE SURE YOU:   Understand these instructions.  Will watch your condition.  Will get help right away if you are not doing well or get worse. Document Released: 08/06/2005 Document Revised: 12/21/2013 Document Reviewed: 05/29/2013 [facility] Patient Information 2015 [place], [place]. This information is not intended to replace advice given to you by your health care provider. Make sure you discuss any questions you have with your health care provider.

## 2014-05-28 NOTE — Telephone Encounter (Signed)
Pt stated her blood pressures have been 160/100, 143/87, 150/100 and 153/89.  Pt denies any chest pain; SOB but pt complained of being dizzy and "funny in the head".  Pt advised to go to ED or urgent care to be evaluated.  No appts today at [facility].  Precepted with Dr. Gwendlyn Deutscher; agree with plan.  Will forward to PCP.  Derl Barrow, RN

## 2014-06-29 ENCOUNTER — Encounter: Payer: Self-pay | Admitting: Gastroenterology

## 2014-08-10 ENCOUNTER — Telehealth: Payer: Self-pay | Admitting: Family Medicine

## 2014-08-10 NOTE — Telephone Encounter (Signed)
Pt called and needs a refill on her BP medication called in. jw °

## 2014-08-16 ENCOUNTER — Encounter: Payer: 59 | Admitting: Family Medicine

## 2014-08-17 MED ORDER — AMLODIPINE BESYLATE 5 MG PO TABS: 5.0000 mg | ORAL_TABLET | Freq: Every day | ORAL | Status: DC

## 2014-08-17 MED ORDER — LISINOPRIL-HYDROCHLOROTHIAZIDE 20-25 MG PO TABS: 1.0000 | ORAL_TABLET | Freq: Every day | ORAL | Status: DC

## 2014-08-17 NOTE — Telephone Encounter (Signed)
Please let the patient know that I refilled her BP medications.  Thanks, [name], MD [facility] Resident  08/17/2014, 10:28 AM

## 2014-08-19 ENCOUNTER — Ambulatory Visit (AMBULATORY_SURGERY_CENTER): Payer: Self-pay | Admitting: *Deleted

## 2014-08-19 VITALS — Ht 65.5 in | Wt 196.0 lb

## 2014-08-19 DIAGNOSIS — Z800 Family history of malignant neoplasm of digestive organs: Secondary | ICD-10-CM

## 2014-08-19 MED ORDER — MOVIPREP 100 G PO SOLR: 1.0000 | Freq: Once | ORAL | Status: DC

## 2014-08-19 NOTE — Progress Notes (Signed)
No egg or soy allergy. ewm With tubal ligation hard to wake and had post op N/V. ewm No diet pills. ewm No home 02 use. ewm emmi video to pt's e mail. ewm

## 2014-08-27 ENCOUNTER — Ambulatory Visit (AMBULATORY_SURGERY_CENTER): Payer: 59 | Admitting: Gastroenterology

## 2014-08-27 ENCOUNTER — Encounter: Payer: Self-pay | Admitting: Gastroenterology

## 2014-08-27 VITALS — BP 145/80 | HR 69 | Temp 97.0°F | Resp 17 | Ht 65.5 in | Wt 196.0 lb

## 2014-08-27 DIAGNOSIS — Z1211 Encounter for screening for malignant neoplasm of colon: Secondary | ICD-10-CM

## 2014-08-27 DIAGNOSIS — Z800 Family history of malignant neoplasm of digestive organs: Secondary | ICD-10-CM

## 2014-08-27 DIAGNOSIS — K649 Unspecified hemorrhoids: Secondary | ICD-10-CM

## 2014-08-27 MED ORDER — SODIUM CHLORIDE 0.9 % IV SOLN: 500.0000 mL | INTRAVENOUS | Status: DC

## 2014-08-27 NOTE — Op Note (Signed)
[facility]  [address] [place], [address]   COLONOSCOPY PROCEDURE REPORT  PATIENT: Lacey Barron, Lacey Barron  MR#: [id number] BIRTHDATE: [birth year] , 82  yrs. old GENDER: female ENDOSCOPIST: Milus Banister, MD PROCEDURE DATE:  08/27/2014 PROCEDURE:   Colonoscopy, screening First Screening Colonoscopy - Avg.  risk and is 50 yrs.  old or older - No.  Prior Negative Screening - Now for repeat screening. N/A  History of Adenoma - Now for follow-up colonoscopy & has been > or = to 3 yrs.  N/A  Polyps Removed Today? No.  Recommend repeat exam, <10 yrs? Yes.  High risk (family or personal hx). ASA CLASS:   Class II INDICATIONS:patient's immediate family history of colon cancer (father), colonoscopy Dr. Sharlett Iles 2010 was normal. MEDICATIONS: Monitored anesthesia care and Propofol 200 mg IV  DESCRIPTION OF PROCEDURE:   After the risks benefits and alternatives of the procedure were thoroughly explained, informed consent was obtained.  The digital rectal exam revealed no abnormalities of the rectum.   The LB [id number]  endoscope was introduced through the anus and advanced to the cecum, which was identified by both the appendix and ileocecal valve. No adverse events experienced.   The quality of the prep was excellent.  The instrument was then slowly withdrawn as the colon was fully examined.   COLON FINDINGS: The examination was normal except for small external hemorrhoids.  Retroflexed views revealed no abnormalities. The time to cecum=4 minutes 01 seconds.  Withdrawal time=7 minutes 56 seconds.  The scope was withdrawn and the procedure completed. COMPLICATIONS: There were no immediate complications.  ENDOSCOPIC IMPRESSION: The examination was normal except for small external hemorrhoids No polyps or cancers  RECOMMENDATIONS: Given your significant family history of colon cancer, you should have a repeat colonoscopy in 5 years  eSigned:  Milus Banister,  MD 08/27/2014 1:38 PM

## 2014-08-27 NOTE — Progress Notes (Signed)
Report to PACU, RN, vss, BBS= Clear.  

## 2014-08-27 NOTE — Patient Instructions (Signed)
Discharge instructions given. Handouts on hemorrhoids and a high fiber diet. Resume previous medications. YOU HAD AN ENDOSCOPIC PROCEDURE TODAY AT [facility]: Refer to the procedure report that was given to you for any specific questions about what was found during the examination.  If the procedure report does not answer your questions, please call your gastroenterologist to clarify.  If you requested that your care partner not be given the details of your procedure findings, then the procedure report has been included in a sealed envelope for you to review at your convenience later.  YOU SHOULD EXPECT: Some feelings of bloating in the abdomen. Passage of more gas than usual.  Walking can help get rid of the air that was put into your GI tract during the procedure and reduce the bloating. If you had a lower endoscopy (such as a colonoscopy or flexible sigmoidoscopy) you may notice spotting of blood in your stool or on the toilet paper. If you underwent a bowel prep for your procedure, then you may not have a normal bowel movement for a few days.  DIET: Your first meal following the procedure should be a light meal and then it is ok to progress to your normal diet.  A half-sandwich or bowl of soup is an example of a good first meal.  Heavy or fried foods are harder to digest and may make you feel nauseous or bloated.  Likewise meals heavy in dairy and vegetables can cause extra gas to form and this can also increase the bloating.  Drink plenty of fluids but you should avoid alcoholic beverages for 24 hours.  ACTIVITY: Your care partner should take you home directly after the procedure.  You should plan to take it easy, moving slowly for the rest of the day.  You can resume normal activity the day after the procedure however you should NOT DRIVE or use heavy machinery for 24 hours (because of the sedation medicines used during the test).    SYMPTOMS TO REPORT IMMEDIATELY: A  gastroenterologist can be reached at any hour.  During normal business hours, 8:30 AM to 5:00 PM Monday through Friday, call [phone].  After hours and on weekends, please call the GI answering service at [phone] who will take a message and have the physician on call contact you.   Following lower endoscopy (colonoscopy or flexible sigmoidoscopy):  Excessive amounts of blood in the stool  Significant tenderness or worsening of abdominal pains  Swelling of the abdomen that is new, acute  Fever of 100F or higher  FOLLOW UP: If any biopsies were taken you will be contacted by phone or by letter within the next 1-3 weeks.  Call your gastroenterologist if you have not heard about the biopsies in 3 weeks.  Our staff will call the home number listed on your records the next business day following your procedure to check on you and address any questions or concerns that you may have at that time regarding the information given to you following your procedure. This is a courtesy call and so if there is no answer at the home number and we have not heard from you through the emergency physician on call, we will assume that you have returned to your regular daily activities without incident.  SIGNATURES/CONFIDENTIALITY: You and/or your care partner have signed paperwork which will be entered into your electronic medical record.  These signatures attest to the fact that that the information above on your After Visit Summary  has been reviewed and is understood.  Full responsibility of the confidentiality of this discharge information lies with you and/or your care-partner. 

## 2014-08-30 ENCOUNTER — Telehealth: Payer: Self-pay

## 2014-08-30 ENCOUNTER — Encounter: Payer: 59 | Admitting: Family Medicine

## 2014-08-30 NOTE — Telephone Encounter (Signed)
Left a message at [phone] for the pt to call us back if any questions or concerns. maw

## 2014-09-06 ENCOUNTER — Encounter: Payer: Self-pay | Admitting: Family Medicine

## 2014-09-06 ENCOUNTER — Other Ambulatory Visit (HOSPITAL_COMMUNITY)
Admission: RE | Admit: 2014-09-06 | Discharge: 2014-09-06 | Disposition: A | Discharge status: Home / Self Care | Payer: 59 | Source: Ambulatory Visit | Attending: Family Medicine | Admitting: Family Medicine

## 2014-09-06 ENCOUNTER — Ambulatory Visit (INDEPENDENT_AMBULATORY_CARE_PROVIDER_SITE_OTHER): Payer: 59 | Admitting: Family Medicine

## 2014-09-06 VITALS — BP 166/90 | HR 89 | Temp 98.5°F | Ht 66.0 in | Wt 194.0 lb

## 2014-09-06 DIAGNOSIS — S134XXD Sprain of ligaments of cervical spine, subsequent encounter: Secondary | ICD-10-CM

## 2014-09-06 DIAGNOSIS — Z124 Encounter for screening for malignant neoplasm of cervix: Secondary | ICD-10-CM

## 2014-09-06 DIAGNOSIS — M542 Cervicalgia: Secondary | ICD-10-CM

## 2014-09-06 DIAGNOSIS — Z1151 Encounter for screening for human papillomavirus (HPV): Secondary | ICD-10-CM | POA: Insufficient documentation

## 2014-09-06 DIAGNOSIS — I10 Essential (primary) hypertension: Secondary | ICD-10-CM

## 2014-09-06 DIAGNOSIS — E669 Obesity, unspecified: Secondary | ICD-10-CM

## 2014-09-06 DIAGNOSIS — Z0000 Encounter for general adult medical examination without abnormal findings: Secondary | ICD-10-CM

## 2014-09-06 DIAGNOSIS — Z01419 Encounter for gynecological examination (general) (routine) without abnormal findings: Secondary | ICD-10-CM | POA: Insufficient documentation

## 2014-09-06 LAB — CBC
HCT: 35.9 % — ABNORMAL LOW (ref 36.0–46.0)
Hemoglobin: 12.1 g/dL (ref 12.0–15.0)
MCH: 24.7 pg — ABNORMAL LOW (ref 26.0–34.0)
MCHC: 33.7 g/dL (ref 30.0–36.0)
MCV: 73.3 fL — AB (ref 78.0–100.0)
Platelets: 238 10*3/uL (ref 150–400)
RBC: 4.9 MIL/uL (ref 3.87–5.11)
RDW: 17.5 % — ABNORMAL HIGH (ref 11.5–15.5)
WBC: 4.8 10*3/uL (ref 4.0–10.5)

## 2014-09-06 LAB — COMPREHENSIVE METABOLIC PANEL
ALT: 10 U/L (ref 0–35)
AST: 12 U/L (ref 0–37)
Albumin: 4.4 g/dL (ref 3.5–5.2)
Alkaline Phosphatase: 44 U/L (ref 39–117)
BUN: 15 mg/dL (ref 6–23)
CALCIUM: 9.5 mg/dL (ref 8.4–10.5)
CO2: 29 meq/L (ref 19–32)
Chloride: 102 mEq/L (ref 96–112)
Creat: 0.8 mg/dL (ref 0.50–1.10)
Glucose, Bld: 100 mg/dL — ABNORMAL HIGH (ref 70–99)
POTASSIUM: 3.9 meq/L (ref 3.5–5.3)
Sodium: 142 mEq/L (ref 135–145)
Total Bilirubin: 1.1 mg/dL (ref 0.2–1.2)
Total Protein: 7 g/dL (ref 6.0–8.3)

## 2014-09-06 LAB — LIPID PANEL
CHOL/HDL RATIO: 3.4 ratio
Cholesterol: 128 mg/dL (ref 0–200)
HDL: 38 mg/dL — ABNORMAL LOW (ref >39)
LDL Cholesterol: 77 mg/dL (ref 0–99)
Triglycerides: 66 mg/dL (ref <150)
VLDL: 13 mg/dL (ref 0–40)

## 2014-09-06 MED ORDER — CYCLOBENZAPRINE HCL 10 MG PO TABS: 10.0000 mg | ORAL_TABLET | Freq: Three times a day (TID) | ORAL | Status: DC | PRN

## 2014-09-06 MED ORDER — LISINOPRIL-HYDROCHLOROTHIAZIDE 20-25 MG PO TABS: 1.0000 | ORAL_TABLET | Freq: Every day | ORAL | Status: DC

## 2014-09-06 MED ORDER — AMLODIPINE BESYLATE 5 MG PO TABS: 5.0000 mg | ORAL_TABLET | Freq: Every day | ORAL | Status: DC

## 2014-09-06 MED ORDER — TRAMADOL-ACETAMINOPHEN 37.5-325 MG PO TABS: 1.0000 | ORAL_TABLET | Freq: Three times a day (TID) | ORAL | Status: DC | PRN

## 2014-09-06 NOTE — Addendum Note (Signed)
Addended by: [place] Pink on: 09/06/2014 12:09 PM   Modules accepted: Orders

## 2014-09-06 NOTE — Progress Notes (Signed)
59 y.o. year old female presents for well woman/preventative visit and annual GYN examination.  Acute Concerns: Periorbital swelling. Patient endorses an allergic reaction type response to Ibuprofen. She states she used to take Ibuprofen prior to bed for her shoulder/back pain, however one evening her daughter noted swelling circumferentially around her eyes. She denies any change in vision, itching, pain, SOB, feelings of her throat closing up, difficulty swallowing. She went to bed and the swelling was improved by the AM, will complete resolution around 2pm the subsequent day. She states that she tried this 3 more times with Ibuprofen or Aleve to "test it out" and noted the same thing happened each time. She took Benadryl once which did not seem to speed of the resolution of her symptoms.    Hypertension Blood pressure at home: 140s/80s Blood pressure today: 166/90 > 158/92 Taking Meds:Taking lisinopril-HCTZ on a regular basis. Not taking amlodipine 5mg  as her BP  Side effects: None ROS: Denies headache, dizziness, visual changes, nausea, vomiting, chest pain, abdominal pain or shortness of breath.  Thoracic back pain/should pain: was taking Flexeril and Ultracet. Feels like her pain is now more in the thoracic region which is changed from previously (more in the neck and shoulders). She continues to endorse tension and "knots" in her shoulders. Has used a Production assistant, radio which helps some. Afraid to take NSAIDs due to previous periorbital swelling.   Diet: Well balanced, however hasn't changed her eating for her health. Doesn't eat out much. Does consume some fried foods.  Exercise: Decreased exercise due to moving twice recently (used to walk regularly).  Sexual/Birth History: Recently remarried in Nov 2015 to her ex-husband. No concerns of STDs. No vaginal discharge, pruritus,  vaginal bleeding, dyspareunia. Has approximately 3 menses per year, then has occasional spotting. Has hot flashes that don't  bother her too much.   Social:  History   Social History  . Marital Status: Divorced    Spouse Name: N/A    Number of Children: N/A  . Years of Education: N/A   Social History Main Topics  . Smoking status: Never Smoker   . Smokeless tobacco: Never Used  . Alcohol Use: No  . Drug Use: No  . Sexual Activity: None   Other Topics Concern  . None   Social History Narrative    Immunization:  Tdap/TD: Done in 2009  Influenza: Done this year for work   Cancer Screening:  Pap Smear: 2011: ABSENT transition zone, negative for malignancy   Mammogram: 08/2012; benign   Colonoscopy: 08/2014: external hemorrhoids, repeat in 5 yrs given family h/o colon cancer   Dexa: Not done  Physical Exam: Blood pressure 166/90 , pulse 89, temperature 98.5 F (36.9 C), temperature source Oral, height 5\' 6"  (1.676 m), weight 194 lb (87.998 kg).  Repeat manual BP 158/92 GEN: 59 y/o female in NAD  HEENT: Atraumatic, normocephalic. PERRL. Sclera anicteric. No LAD or thyromegaly. CARDIAC: RRR, no m/r/g noted. 2+ radial and DP pulses bilaterally. RESP: CTAB without wheezing, rhonchi, or crackles.  BREAST:Exam performed in the presence of a chaperone.  ABD: +BS, soft, non-tender, non-distended GU/GYN:Exam performed in the presence of a chaperone.  GYN:  External genitalia within normal limits.  Vaginal mucosa pink, moist, normal rugae.  Nonfriable cervix without lesions, no discharge or bleeding noted on speculum exam.  Bimanual exam revealed normal, nongravid uterus.  No cervical motion tenderness. No adnexal masses bilaterally. BACK: Paraspinal muscle tension in the thoracic region bilaterally. No tenderness over the paraspinal muscles.  No drop offs.    SKIN: No rashes noted.  ASSESSMENT & PLAN: 59 y.o. female presents for annual well woman/preventative exam and GYN exam. Please see problem specific assessment and plan.

## 2014-09-06 NOTE — Assessment & Plan Note (Addendum)
Patient self-discontinued amlodipine as her BPs at home were 140s/80s, however has been taking lisinopril-HCTZ 20/25 daily. BP elevated today in clinic with a repeat manuel BP of 154/92.  - Patient to start taking amlodipine 5mg  again - Continue lisinopril-HCTZ 20/25mg  - CMET today - F/u in 3 months to assess BP with amlodipine (pt to bring BP machine to compare to our readings)

## 2014-09-06 NOTE — Patient Instructions (Addendum)
It was nice to meet you today. I will call and let you know about your pap smear results. Please do not take Ibuprofen or Aleve again, it will be okay to Tylenol instead (if you run out of Ultracet).  Please follow up with me in 3 months so we can continue to assess your blood pressure (bring your blood pressure cuff at that time).

## 2014-09-06 NOTE — Assessment & Plan Note (Addendum)
Patient states she continues to have pain her her shoulders and thoracic pain characterized as "knots" and muscle tension. She has used a Production assistant, radio which has helped some. States she was taking Ibuprofen which was helping, however began to have periorbital swelling secondary to taking NSAIDS (tried this 4 times with no symptoms of anaphylaxis and no improvement with Benadryl). As she most likely has an intolerance to NSAIDs, we dicussed avoiding these and using Ultracet and/or Tylenol. Paraspinal muscle tension on exam, no red flags on exam or on history - Refilled Ultracet and Flexeril today - Discussed home stretches, heat, and cold therapy - If pain continues, will refer to PT and/or Sports Medicine

## 2014-09-06 NOTE — Addendum Note (Signed)
Addended by: [name] on: 09/06/2014 12:09 PM   Modules accepted: Orders

## 2014-09-06 NOTE — Assessment & Plan Note (Signed)
Pap smear: Last was normal (however lacked transition zone) in 2011. Pap smear with HR HPV testing done today. Colonoscopy: Done 08/2014 and was normal; repeat in 5 years due to family history of colon cancer  Mammogram: Benign in 08/2012; due this year for repeat  Lipid panel: Last screen Nov 2011 with an LDL of 84; will repeat lipid panel today Vaccinations: Influenza vaccine given at work [employer] employee). Up to date on Tdap as well.

## 2014-09-07 ENCOUNTER — Encounter: Payer: Self-pay | Admitting: Family Medicine

## 2014-09-08 LAB — CYTOLOGY - PAP

## 2014-09-09 ENCOUNTER — Encounter: Payer: Self-pay | Admitting: Family Medicine

## 2015-04-13 ENCOUNTER — Other Ambulatory Visit: Payer: Self-pay | Admitting: Family Medicine

## 2015-04-13 ENCOUNTER — Encounter: Payer: Self-pay | Admitting: Family Medicine

## 2015-04-13 DIAGNOSIS — R059 Cough, unspecified: Secondary | ICD-10-CM

## 2015-04-13 DIAGNOSIS — R05 Cough: Secondary | ICD-10-CM

## 2015-04-13 MED ORDER — ALBUTEROL SULFATE HFA 108 (90 BASE) MCG/ACT IN AERS: 2.0000 | INHALATION_SPRAY | RESPIRATORY_TRACT | Status: DC | PRN

## 2015-06-14 ENCOUNTER — Other Ambulatory Visit: Payer: Self-pay | Admitting: Family Medicine

## 2015-06-14 NOTE — Telephone Encounter (Signed)
Please let the patient know that I refilled her Rx for 2 months. She should make a f/u with me within the next 2 months as she was instructed to f/u with me in April 2015 as we changed her regimen in January.   Thanks, [name], MD [facility] Resident  06/14/2015, 10:17 AM

## 2015-06-17 NOTE — Telephone Encounter (Signed)
Spoke with pt.  She is planning on coming in January for her physical.  She will call back in December to schedule. Savita Runner, [name]

## 2015-10-11 ENCOUNTER — Ambulatory Visit (INDEPENDENT_AMBULATORY_CARE_PROVIDER_SITE_OTHER): Payer: 59 | Admitting: Family Medicine

## 2015-10-11 ENCOUNTER — Encounter: Payer: Self-pay | Admitting: Family Medicine

## 2015-10-11 ENCOUNTER — Other Ambulatory Visit: Payer: Self-pay

## 2015-10-11 VITALS — BP 154/91 | HR 82 | Temp 99.0°F | Ht 66.0 in | Wt 198.8 lb

## 2015-10-11 DIAGNOSIS — Z0000 Encounter for general adult medical examination without abnormal findings: Secondary | ICD-10-CM

## 2015-10-11 DIAGNOSIS — M542 Cervicalgia: Secondary | ICD-10-CM | POA: Diagnosis not present

## 2015-10-11 DIAGNOSIS — I10 Essential (primary) hypertension: Secondary | ICD-10-CM | POA: Diagnosis not present

## 2015-10-11 DIAGNOSIS — E669 Obesity, unspecified: Secondary | ICD-10-CM

## 2015-10-11 DIAGNOSIS — H52223 Regular astigmatism, bilateral: Secondary | ICD-10-CM | POA: Diagnosis not present

## 2015-10-11 DIAGNOSIS — Z1231 Encounter for screening mammogram for malignant neoplasm of breast: Secondary | ICD-10-CM

## 2015-10-11 DIAGNOSIS — R202 Paresthesia of skin: Secondary | ICD-10-CM

## 2015-10-11 DIAGNOSIS — H5213 Myopia, bilateral: Secondary | ICD-10-CM | POA: Diagnosis not present

## 2015-10-11 DIAGNOSIS — H2513 Age-related nuclear cataract, bilateral: Secondary | ICD-10-CM | POA: Diagnosis not present

## 2015-10-11 DIAGNOSIS — H524 Presbyopia: Secondary | ICD-10-CM | POA: Diagnosis not present

## 2015-10-11 LAB — LIPID PANEL
CHOLESTEROL: 138 mg/dL (ref 125–200)
HDL: 38 mg/dL — ABNORMAL LOW (ref >=46)
LDL Cholesterol: 85 mg/dL (ref <130)
TRIGLYCERIDES: 77 mg/dL (ref <150)
Total CHOL/HDL Ratio: 3.6 Ratio (ref <=5.0)
VLDL: 15 mg/dL (ref <30)

## 2015-10-11 LAB — COMPLETE METABOLIC PANEL WITH GFR
ALBUMIN: 4.5 g/dL (ref 3.6–5.1)
ALK PHOS: 45 U/L (ref 33–130)
ALT: 10 U/L (ref 6–29)
AST: 12 U/L (ref 10–35)
BILIRUBIN TOTAL: 0.9 mg/dL (ref 0.2–1.2)
BUN: 15 mg/dL (ref 7–25)
CHLORIDE: 103 mmol/L (ref 98–110)
CO2: 30 mmol/L (ref 20–31)
CREATININE: 0.88 mg/dL (ref 0.50–1.05)
Calcium: 9.3 mg/dL (ref 8.6–10.4)
GFR, EST AFRICAN AMERICAN: 83 mL/min (ref >=60)
GFR, Est Non African American: 72 mL/min (ref >=60)
Glucose, Bld: 111 mg/dL — ABNORMAL HIGH (ref 65–99)
Potassium: 4 mmol/L (ref 3.5–5.3)
SODIUM: 141 mmol/L (ref 135–146)
Total Protein: 7.2 g/dL (ref 6.1–8.1)

## 2015-10-11 LAB — POCT GLYCOSYLATED HEMOGLOBIN (HGB A1C): Hemoglobin A1C: 4.9

## 2015-10-11 MED ORDER — TRAMADOL-ACETAMINOPHEN 37.5-325 MG PO TABS: 1.0000 | ORAL_TABLET | Freq: Three times a day (TID) | ORAL | Status: DC | PRN

## 2015-10-11 MED ORDER — IBUPROFEN 800 MG PO TABS: 800.0000 mg | ORAL_TABLET | Freq: Three times a day (TID) | ORAL | Status: DC | PRN

## 2015-10-11 MED ORDER — AMLODIPINE BESYLATE 5 MG PO TABS: 5.0000 mg | ORAL_TABLET | Freq: Every day | ORAL | Status: DC

## 2015-10-11 MED ORDER — LISINOPRIL-HYDROCHLOROTHIAZIDE 20-25 MG PO TABS: 1.0000 | ORAL_TABLET | Freq: Every day | ORAL | Status: DC

## 2015-10-11 MED FILL — IBUPROFEN 800 MG TABLET: 800 | 10 days supply | Qty: 30 | Fill #0

## 2015-10-11 MED FILL — TRAMADOL-APAP 37.5-325 TAB: 37.5-325 | 10 days supply | Qty: 30 | Fill #0

## 2015-10-11 MED FILL — LISINOPRIL-HCTZ 20-25 MG TA: 20-25 | 30 days supply | Qty: 30 | Fill #0

## 2015-10-11 MED FILL — AMLODIPINE BESYLATE 5 MG TA: 5 | 30 days supply | Qty: 30 | Fill #0

## 2015-10-11 NOTE — Assessment & Plan Note (Signed)
Poorly controlled Patient has been out of amlodipine for nearly a month Resume amlodipine 5 mg daily and lisinopril-HCTZ 20-25 milligrams daily CMET, lipid panel, A1c today Follow-up in one month to assess blood pressure control

## 2015-10-11 NOTE — Progress Notes (Signed)
Subjective:   Lacey Barron is a 60 y.o. female with a history of HTN, obesity, chronic neck pain here for HTN and obesity follow-up, left leg and arm tingling, neck and shoulder pain.  HTN, obesity: - Medications: Amlodipine 5 mg daily, lisinopril-HCTZ 20-25 mg daily - Compliance: good, but has been out of amlodipine for ~20 days - Checking BP at home: yes, 140s/70s lately - Denies any SOB (only intermittently with asthma), CP, vision changes, LE edema, medication SEs, or symptoms of hypotension - Diet: mostly baked meats, doesn't like many vegetables, protein smoothies some mornings, eats a lot of carbs too, drinks mostly water, but also ginger ale, weakness  - Exercise: walking 3-4 times per week 85m-1hr - reports that she is going to see the "fat doctor" with [facility]  L leg and arm tingling - intermittently when sleeping on left side or sitting - goes away after getting off that side - ongoing for ~6 months - hasnt tried any medications - reports that she had testing of circulation  - als ohas chronic back pain - no pain that radiates down leg or arm - no weakness  Thoracic back pain/shoulder pain: taking Ultracet and ibuprofen prn.  Reports that eye swelling hasnt recurred with NSAIDs.  She continues to endorse tension and "knots" in her shoulders. Needs tramadol refill.  HCM - needs mammogram - UTD on pap smear - HIV/HCV  Review of Systems: Per HPI.   Social History: never smoker  Objective:  BP 154/91 mmHg  Pulse 82  Temp(Src) 99 F (37.2 C) (Oral)  Ht 5\' 6"  (1.676 m)  Wt 198 lb 12.8 oz (90.175 kg)  BMI 32.10 kg/m2  Gen:  60 y.o. female in NAD HEENT: NCAT, MMM, EOMI, PERRL, anicteric sclerae CV: RRR, no MRG Resp: Non-labored, CTAB, no wheezes noted Ext: WWP, no edema MSK: Full ROM, strength intact in extremities, TTP over cervical and thoracic spine and paraspinal muscles Neuro: Alert and oriented, speech normal, strength and sensation intact, cranial  nerves intact, gait normal      Chemistry      Component Value Date/Time   NA 142 09/06/2014 0932   K 3.9 09/06/2014 0932   CL 102 09/06/2014 0932   CO2 29 09/06/2014 0932   BUN 15 09/06/2014 0932   CREATININE 0.80 09/06/2014 0932   CREATININE 0.90 05/28/2014 1409      Component Value Date/Time   CALCIUM 9.5 09/06/2014 0932   ALKPHOS 44 09/06/2014 0932   AST 12 09/06/2014 0932   ALT 10 09/06/2014 0932   BILITOT 1.1 09/06/2014 0932      Lab Results  Component Value Date   WBC 4.8 09/06/2014   HGB 12.1 09/06/2014   HCT 35.9* 09/06/2014   MCV 73.3* 09/06/2014   PLT 238 09/06/2014   Lab Results  Component Value Date   TSH 2.044 12/30/2012    Assessment & Plan:     Lacey Barron is a 60 y.o. female here for:  HYPERTENSION, BENIGN, MILD Poorly controlled Patient has been out of amlodipine for nearly a month Resume amlodipine 5 mg daily and lisinopril-HCTZ 20-25 milligrams daily CMET, lipid panel, A1c today Follow-up in one month to assess blood pressure control  Obesity Discuss healthy diet and exercise Discussed plate method Discussed limiting sugary drinks  NECK PAIN Chronic issue for this patient Refilled Ultracet and ibuprofen Follow-up with primary care doctor  Routine health maintenance HIV and hepatitis C testing today Patient given information about mammograms  Tingling  in extremities Benign exam and neuro intact Advised patient that compressing nerve while laying or sitting on a side can cause tingling in that extremity Advised frequent position changes  return precautions discussed     Virginia Crews, MD MPH PGY-2,  [place] [facility] 10/11/2015  12:15 PM

## 2015-10-11 NOTE — Assessment & Plan Note (Signed)
Chronic issue for this patient Refilled Ultracet and ibuprofen Follow-up with primary care doctor

## 2015-10-11 NOTE — Patient Instructions (Signed)
Nice to meet you today. We are getting some labs and someone will call you or send you a letter with these results when they're available. Continue taking your current medications. Please follow-up with your primary care doctor in one month to check your blood pressure. Please call to schedule mammogram.  Take care, Dr. Jacinto Reap

## 2015-10-11 NOTE — Assessment & Plan Note (Signed)
Benign exam and neuro intact Advised patient that compressing nerve while laying or sitting on a side can cause tingling in that extremity Advised frequent position changes  return precautions discussed

## 2015-10-11 NOTE — Assessment & Plan Note (Signed)
HIV and hepatitis C testing today Patient given information about mammograms

## 2015-10-11 NOTE — Assessment & Plan Note (Signed)
Discuss healthy diet and exercise Discussed plate method Discussed limiting sugary drinks

## 2015-10-12 LAB — HIV ANTIBODY (ROUTINE TESTING W REFLEX): HIV 1&2 Ab, 4th Generation: NONREACTIVE

## 2015-10-12 LAB — HEPATITIS C ANTIBODY: HCV Ab: NEGATIVE

## 2015-10-13 ENCOUNTER — Encounter: Payer: Self-pay | Admitting: Family Medicine

## 2015-10-26 ENCOUNTER — Ambulatory Visit
Admission: RE | Admit: 2015-10-26 | Discharge: 2015-10-26 | Disposition: A | Discharge status: Home / Self Care | Payer: 59 | Source: Ambulatory Visit

## 2015-10-26 DIAGNOSIS — Z1231 Encounter for screening mammogram for malignant neoplasm of breast: Secondary | ICD-10-CM

## 2015-10-27 ENCOUNTER — Encounter: Payer: Self-pay | Admitting: Family Medicine

## 2015-11-07 ENCOUNTER — Ambulatory Visit (INDEPENDENT_AMBULATORY_CARE_PROVIDER_SITE_OTHER): Payer: 59 | Admitting: Family Medicine

## 2015-11-07 ENCOUNTER — Encounter: Payer: Self-pay | Admitting: Family Medicine

## 2015-11-07 VITALS — BP 136/72 | HR 102 | Temp 98.4°F | Ht 66.0 in | Wt 200.6 lb

## 2015-11-07 DIAGNOSIS — J309 Allergic rhinitis, unspecified: Secondary | ICD-10-CM | POA: Diagnosis not present

## 2015-11-07 DIAGNOSIS — R05 Cough: Secondary | ICD-10-CM | POA: Diagnosis not present

## 2015-11-07 DIAGNOSIS — I10 Essential (primary) hypertension: Secondary | ICD-10-CM | POA: Diagnosis not present

## 2015-11-07 DIAGNOSIS — J4530 Mild persistent asthma, uncomplicated: Secondary | ICD-10-CM

## 2015-11-07 DIAGNOSIS — R059 Cough, unspecified: Secondary | ICD-10-CM

## 2015-11-07 MED ORDER — ALBUTEROL SULFATE HFA 108 (90 BASE) MCG/ACT IN AERS: 2.0000 | INHALATION_SPRAY | RESPIRATORY_TRACT | Status: DC | PRN

## 2015-11-07 MED ORDER — AMLODIPINE BESYLATE 5 MG PO TABS: 5.0000 mg | ORAL_TABLET | Freq: Every day | ORAL | Status: DC

## 2015-11-07 MED ORDER — MONTELUKAST SODIUM 10 MG PO TABS: 10.0000 mg | ORAL_TABLET | Freq: Every day | ORAL | Status: DC

## 2015-11-07 MED ORDER — LISINOPRIL-HYDROCHLOROTHIAZIDE 20-25 MG PO TABS: 1.0000 | ORAL_TABLET | Freq: Every day | ORAL | Status: DC

## 2015-11-07 MED ORDER — FLUTICASONE PROPIONATE 50 MCG/ACT NA SUSP: 2.0000 | Freq: Every day | NASAL | Status: DC

## 2015-11-07 MED ORDER — CETIRIZINE HCL 10 MG PO TABS: 10.0000 mg | ORAL_TABLET | Freq: Every day | ORAL | Status: DC

## 2015-11-07 MED FILL — VENTOLIN HFA 90 MCG INHALER: 108 (90 BAS | 17 days supply | Qty: 18 | Fill #0

## 2015-11-07 MED FILL — AMLODIPINE BESYLATE 5 MG TA: 5 | 90 days supply | Qty: 90 | Fill #0

## 2015-11-07 MED FILL — FLUTICASONE PROP 50 MCG SPR: 50 | 30 days supply | Qty: 16 | Fill #0

## 2015-11-07 MED FILL — LISINOPRIL-HCTZ 20-25 MG TA: 20-25 | 90 days supply | Qty: 90 | Fill #0

## 2015-11-07 MED FILL — MONTELUKAST SOD 10 MG TAB: 10 | 90 days supply | Qty: 90 | Fill #0

## 2015-11-07 NOTE — Patient Instructions (Signed)
Sent in refills on BP medications, albuterol  Start flonase, zyrtec, and singulair  Follow up with Dr. Lorenso Courier or another doctor here in 4-6 weeks, sooner if asthma not improving.  Blood pressure looks great.  Be well, Dr. Ardelia Mems

## 2015-11-07 NOTE — Progress Notes (Signed)
Date of Visit: 11/07/2015   HPI:  Patient presents today to follow up on her hypertension, also to get refills on medications including albuterol.  Hypertension - Was seen by Dr. Brita Romp on 2/21 after not being seen here in over one year. Blood pressure was elevated at that time but had been out of amlodipine for 20 days. Restarted amlodipine at that visit. Patient continues on lisinopril-HCTZ.   Asthma - Has history of asthma which typically flares during certain seasons. Has noted increased tightness in breathing lately at night. Hasn't had albuterol in a while. Takes benadryl to help her with the breathing troubles in the evening prior to going to sleep. Denies chest pain, shortness of breath otherwise, or edema. Does not wake up gasping for air.   Allergic rhinitis - Does endorse lots of allergic symptoms. Not consistently using flonase. Has recently switched away from zyrtec because it stopped working.   ROS: See HPI.  [place]: history of iron deficiency anemia, hypertension, obesity  PHYSICAL EXAM: BP 136/72 mmHg  Pulse 102  Temp(Src) 98.4 F (36.9 C) (Oral)  Ht 5\' 6"  (1.676 m)  Wt 200 lb 9.6 oz (90.992 kg)  BMI 32.39 kg/m2 Gen: NAD, pleasant, cooperative HEENT: normocephalic, atraumatic. Nares patent.  Heart: regular rate and rhythm, no murmur Lungs: clear to auscultation bilaterally, normal work of breathing  Neuro: alert, grossly nonfocal, speech normal Ext: No appreciable lower extremity edema bilaterally   ASSESSMENT/PLAN:  Allergic rhinitis Suspect uncontrolled asthma symptoms are related to allergic rhinitis as well. Will start scheduled flonase and zyrtec. Also add singulair. Follow up with PCP in 4-6 weeks, sooner if not improving. Encouraged to avoid daily use of benadryl since it can be habit forming.  HYPERTENSION, BENIGN, MILD Well controlled. Continue current regimen.   Asthma Report of asthma per patient, though not on problem list. Symptoms at nighttime  concerning for uncontrolled asthma. Certainly she is not acutely flaring today. We are addressing her allergic symptoms by adding singulair and scheduling flonase and zyrtec. Refill albuterol inhaler today. Follow up with PCP in 4-6 weeks to eval for improvement.   FOLLOW UP: Follow up in 4-6 weeks with PCP for asthma and allergies  [place] J. Ardelia Mems, [place]

## 2015-11-10 DIAGNOSIS — J309 Allergic rhinitis, unspecified: Secondary | ICD-10-CM | POA: Insufficient documentation

## 2015-11-10 DIAGNOSIS — J45909 Unspecified asthma, uncomplicated: Secondary | ICD-10-CM | POA: Insufficient documentation

## 2015-11-10 NOTE — Assessment & Plan Note (Signed)
Well-controlled.  Continue current regimen. 

## 2015-11-10 NOTE — Assessment & Plan Note (Signed)
Report of asthma per patient, though not on problem list. Symptoms at nighttime concerning for uncontrolled asthma. Certainly she is not acutely flaring today. We are addressing her allergic symptoms by adding singulair and scheduling flonase and zyrtec. Refill albuterol inhaler today. Follow up with PCP in 4-6 weeks to eval for improvement.

## 2015-11-10 NOTE — Assessment & Plan Note (Addendum)
Suspect uncontrolled asthma symptoms are related to allergic rhinitis as well. Will start scheduled flonase and zyrtec. Also add singulair. Follow up with PCP in 4-6 weeks, sooner if not improving. Encouraged to avoid daily use of benadryl since it can be habit forming.

## 2016-01-09 MED FILL — TRAMADOL-APAP 37.5-325 TAB: 37.5-325 | 10 days supply | Qty: 30 | Fill #1

## 2016-02-15 ENCOUNTER — Other Ambulatory Visit: Payer: Self-pay | Admitting: *Deleted

## 2016-02-15 DIAGNOSIS — M542 Cervicalgia: Secondary | ICD-10-CM

## 2016-02-15 MED FILL — FLUTICASONE PROP 50 MCG SPR: 50 | 30 days supply | Qty: 16 | Fill #1

## 2016-02-15 MED FILL — AMLODIPINE BESYLATE 5 MG TA: 5 | 90 days supply | Qty: 90 | Fill #1

## 2016-02-15 MED FILL — MONTELUKAST SOD 10 MG TAB: 10 | 90 days supply | Qty: 90 | Fill #1

## 2016-02-15 MED FILL — VENTOLIN HFA 90 MCG INHALER: 108 (90 BAS | 17 days supply | Qty: 18 | Fill #1

## 2016-02-15 MED FILL — LISINOPRIL-HCTZ 20-25 MG TA: 20-25 | 90 days supply | Qty: 90 | Fill #1

## 2016-02-16 MED ORDER — IBUPROFEN 800 MG PO TABS: 800.0000 mg | ORAL_TABLET | Freq: Three times a day (TID) | ORAL | Status: DC | PRN

## 2016-02-16 MED FILL — IBUPROFEN 800 MG TABLET: 800 | 10 days supply | Qty: 30 | Fill #0

## 2016-04-20 ENCOUNTER — Telehealth: Payer: 59 | Admitting: Family

## 2016-04-20 DIAGNOSIS — J019 Acute sinusitis, unspecified: Secondary | ICD-10-CM

## 2016-04-20 MED ORDER — AMOXICILLIN-POT CLAVULANATE 875-125 MG PO TABS: 1.0000 | ORAL_TABLET | Freq: Two times a day (BID) | ORAL | 0 refills | Status: DC

## 2016-04-20 NOTE — Progress Notes (Signed)

## 2016-05-28 MED FILL — FLUTICASONE PROP 50 MCG SPR: 50 | 30 days supply | Qty: 16 | Fill #2

## 2016-05-28 MED FILL — VENTOLIN HFA 90 MCG INHALER: 108 (90 BAS | 17 days supply | Qty: 18 | Fill #2

## 2016-06-05 ENCOUNTER — Ambulatory Visit (INDEPENDENT_AMBULATORY_CARE_PROVIDER_SITE_OTHER): Payer: 59 | Admitting: *Deleted

## 2016-06-05 DIAGNOSIS — Z23 Encounter for immunization: Secondary | ICD-10-CM

## 2016-07-31 MED FILL — AMLODIPINE BESYLATE 5 MG TA: 5 | 90 days supply | Qty: 90 | Fill #2

## 2016-07-31 MED FILL — FLUTICASONE PROP 50 MCG SPR: 50 | 30 days supply | Qty: 16 | Fill #3

## 2016-07-31 MED FILL — VENTOLIN HFA 90 MCG INHALER: 108 (90 BAS | 17 days supply | Qty: 18 | Fill #3

## 2016-07-31 MED FILL — LISINOPRIL-HCTZ 20-25 MG TA: 20-25 | 90 days supply | Qty: 90 | Fill #2

## 2016-09-15 ENCOUNTER — Telehealth: Payer: 59 | Admitting: Nurse Practitioner

## 2016-09-15 DIAGNOSIS — J0101 Acute recurrent maxillary sinusitis: Secondary | ICD-10-CM

## 2016-09-15 DIAGNOSIS — J019 Acute sinusitis, unspecified: Secondary | ICD-10-CM | POA: Diagnosis not present

## 2016-09-15 MED ORDER — AMOXICILLIN-POT CLAVULANATE 875-125 MG PO TABS: 1.0000 | ORAL_TABLET | Freq: Two times a day (BID) | ORAL | 0 refills | Status: DC

## 2016-09-15 NOTE — Progress Notes (Signed)

## 2016-11-27 ENCOUNTER — Other Ambulatory Visit: Payer: Self-pay | Admitting: Family Medicine

## 2016-11-27 DIAGNOSIS — I10 Essential (primary) hypertension: Secondary | ICD-10-CM

## 2016-11-27 MED FILL — LISINOPRIL-HCTZ 20-25 MG TA: 20-25 | 30 days supply | Qty: 30 | Fill #0

## 2016-11-27 MED FILL — AMLODIPINE BESYLATE 5 MG TA: 5 | 30 days supply | Qty: 30 | Fill #0

## 2016-11-27 NOTE — Telephone Encounter (Signed)
Refilled medication for 1 month. Pt needs an appt as she has not been see by a provider in over 1 year.  Thanks, [name], MD [facility] Resident  11/27/2016, 2:13 PM

## 2016-11-29 NOTE — Telephone Encounter (Signed)
LM to CB

## 2016-12-03 NOTE — Telephone Encounter (Signed)
Left another message for patient to call back to schedule an appointment. 

## 2017-01-23 ENCOUNTER — Telehealth: Payer: Self-pay | Admitting: Family Medicine

## 2017-01-23 DIAGNOSIS — I10 Essential (primary) hypertension: Secondary | ICD-10-CM

## 2017-01-23 NOTE — Telephone Encounter (Signed)
Pt has cpe scheduled for 01-30-17 but pt will run out of her 2 blood pressure medications before that date.  Outpt clinic

## 2017-01-24 MED ORDER — AMLODIPINE BESYLATE 5 MG PO TABS: 5.0000 mg | ORAL_TABLET | Freq: Every day | ORAL | 0 refills | Status: DC

## 2017-01-24 MED ORDER — LISINOPRIL-HYDROCHLOROTHIAZIDE 20-25 MG PO TABS: 1.0000 | ORAL_TABLET | Freq: Every day | ORAL | 0 refills | Status: DC

## 2017-01-24 MED FILL — LISINOPRIL-HCTZ 20-25 MG TA: 20-25 | 30 days supply | Qty: 30 | Fill #0

## 2017-01-24 MED FILL — AMLODIPINE BESYLATE 5 MG TA: 5 | 30 days supply | Qty: 30 | Fill #0

## 2017-01-24 NOTE — Telephone Encounter (Signed)
I'm assuming the previous message meant the pt needs refills sent to the outpatient pharmacy.   These were sent in for 1 month supply. Stress pt needs to keep appt.  Thanks, [name], MD [facility] Resident  01/24/2017, 8:08 AM

## 2017-01-30 ENCOUNTER — Ambulatory Visit (INDEPENDENT_AMBULATORY_CARE_PROVIDER_SITE_OTHER): Payer: 59 | Admitting: Family Medicine

## 2017-01-30 ENCOUNTER — Encounter: Payer: Self-pay | Admitting: Family Medicine

## 2017-01-30 VITALS — BP 136/76 | HR 102 | Temp 99.1°F | Ht 66.0 in | Wt 202.8 lb

## 2017-01-30 DIAGNOSIS — J45909 Unspecified asthma, uncomplicated: Secondary | ICD-10-CM | POA: Diagnosis not present

## 2017-01-30 DIAGNOSIS — R05 Cough: Secondary | ICD-10-CM | POA: Diagnosis not present

## 2017-01-30 DIAGNOSIS — Z0000 Encounter for general adult medical examination without abnormal findings: Secondary | ICD-10-CM

## 2017-01-30 DIAGNOSIS — M542 Cervicalgia: Secondary | ICD-10-CM

## 2017-01-30 DIAGNOSIS — H5213 Myopia, bilateral: Secondary | ICD-10-CM | POA: Diagnosis not present

## 2017-01-30 DIAGNOSIS — H25043 Posterior subcapsular polar age-related cataract, bilateral: Secondary | ICD-10-CM | POA: Diagnosis not present

## 2017-01-30 DIAGNOSIS — R059 Cough, unspecified: Secondary | ICD-10-CM

## 2017-01-30 DIAGNOSIS — I10 Essential (primary) hypertension: Secondary | ICD-10-CM

## 2017-01-30 DIAGNOSIS — H524 Presbyopia: Secondary | ICD-10-CM | POA: Diagnosis not present

## 2017-01-30 MED ORDER — BACLOFEN 10 MG PO TABS: 10.0000 mg | ORAL_TABLET | Freq: Three times a day (TID) | ORAL | 1 refills | Status: DC | PRN

## 2017-01-30 MED ORDER — MONTELUKAST SODIUM 10 MG PO TABS: 10.0000 mg | ORAL_TABLET | Freq: Every day | ORAL | 2 refills | Status: DC

## 2017-01-30 MED ORDER — AMLODIPINE BESYLATE 5 MG PO TABS: 5.0000 mg | ORAL_TABLET | Freq: Every day | ORAL | 2 refills | Status: DC

## 2017-01-30 MED ORDER — ALBUTEROL SULFATE HFA 108 (90 BASE) MCG/ACT IN AERS: 2.0000 | INHALATION_SPRAY | RESPIRATORY_TRACT | 6 refills | Status: DC | PRN

## 2017-01-30 MED ORDER — FLUTICASONE PROPIONATE 50 MCG/ACT NA SUSP: 2.0000 | Freq: Every day | NASAL | 1 refills | Status: DC

## 2017-01-30 MED ORDER — CETIRIZINE HCL 10 MG PO TABS: 10.0000 mg | ORAL_TABLET | Freq: Every day | ORAL | 1 refills | Status: AC

## 2017-01-30 MED ORDER — LISINOPRIL-HYDROCHLOROTHIAZIDE 20-25 MG PO TABS: 1.0000 | ORAL_TABLET | Freq: Every day | ORAL | 2 refills | Status: DC

## 2017-01-30 MED FILL — BACLOFEN 10 MG TABLET: 10 | 10 days supply | Qty: 30 | Fill #0

## 2017-01-30 MED FILL — FLUTICASONE PROP 50 MCG SPR: 50 | 30 days supply | Qty: 16 | Fill #0

## 2017-01-30 MED FILL — MONTELUKAST SOD 10 MG TAB: 10 | 90 days supply | Qty: 90 | Fill #0

## 2017-01-30 MED FILL — VENTOLIN HFA 90 MCG INHALER: 108 (90 BAS | 25 days supply | Qty: 18 | Fill #0

## 2017-01-30 NOTE — Progress Notes (Signed)
61 y.o. year old female presents for well woman/preventative visit  Acute Concerns:   Neck pain: Continues to have spasms around the trapezius muscles. States she has been taking Flexeril intermittently, however states it makes her drowsy throughout the next day even when she takes it at nighttime.  Asthma: Unsure if she's ever had PFTs. States she is using her albuterol inhaler 3-4 times per week. Was on Singulair  Hypertension: Stable on amlodipine and lisinopril-hydrochlorothiazide. No side effects. No chest pain. She has noted some lower extremity edema over the last few weeks, but states that it is improved as she is started to drink less water.  Diet: varies  Exercise: Exercises regularly, walks and does aerobics  Review of Systems  Constitutional: Negative for chills, fever, malaise/fatigue and weight loss.  HENT: Negative for congestion and hearing loss.   Eyes: Negative for blurred vision and double vision.  Respiratory: Positive for shortness of breath. Negative for cough and wheezing.   Cardiovascular: Positive for leg swelling. Negative for chest pain, palpitations, orthopnea and claudication.  Gastrointestinal: Negative for abdominal pain, blood in stool, constipation, diarrhea, heartburn, melena, nausea and vomiting.  Genitourinary: Negative for dysuria, frequency, hematuria and urgency.  Musculoskeletal: Positive for neck pain. Negative for myalgias.  Skin: Negative for rash.  Neurological: Negative for dizziness and sensory change.  Endo/Heme/Allergies: Negative for polydipsia.  Psychiatric/Behavioral: Negative for depression.     Social:  Social History   Social History  . Marital status: Divorced    Spouse name: N/A  . Number of children: N/A  . Years of education: N/A   Social History Main Topics  . Smoking status: Never Smoker  . Smokeless tobacco: Never Used  . Alcohol use No  . Drug use: No  . Sexual activity: Not Asked   Other Topics Concern  .  None   Social History Narrative  . None    Immunization: Immunization History  Administered Date(s) Administered  . Influenza Whole 07/19/2008  . Influenza,inj,Quad PF,36+ Mos 06/05/2016  . Influenza-Unspecified 07/02/2011, 06/29/2012  . Td 02/18/2008    Cancer Screening:  Pap Smear: 08/2014, negative for changes or HR HPV  Mammogram: No family history of breast cancer, last mammogram 10/26/2015, BI-RADS Category 1  Colonoscopy: Her father had colon cancer, last colonoscopy January 2016 revealing external hemorrhoids, will repeat in 5 years given family history of colon cancer   Physical Exam: Blood pressure 136/76, pulse (!) 102, temperature 99.1 F (37.3 C), temperature source Oral, height 5\' 6"  (1.676 m), weight 202 lb 12.8 oz (92 kg), SpO2 95 %.  VITALS: Reviewed GEN: Pleasant female, NAD HEENT: Normocephalic, PERRL, EOMI, no scleral icterus, bilateral TM pearly grey, nasal septum midline, MMM, uvula midline, no anterior or posterior lymphadenopathy, no thyromegaly CARDIAC:RRR, S1 and S2 present, no murmur, no heaves/thrills RESP: CTAB, normal effort ABD: soft, no tenderness, normal bowel sounds MSK: Tenderness and spacticity in the trapezius muscles bilaterally.  Trace edema in the ankles, 2+ radial and DP pulses SKIN: no rash  ASSESSMENT & PLAN: 61 y.o. female presents for annual well woman/preventative exam and GYN exam. Please see problem specific assessment and plan.   HYPERTENSION, BENIGN, MILD Stable.  Refilled amlodipine and lisinopril-hydrochlorothiazide BMP today Patient to follow-up with her new PCP in 6 months  Asthma Concerns for uncontrolled asthma, I suspect this is more persistent and intermittent given the amount of albuterol she is using. -Continue Singulair -Continue Flonase and Zyrtec -Patient advised to schedule an appointment with Dr. Valentina Lucks for PFTs to  determine whether she would benefit from a controller inhaler  Routine health  maintenance Next Pap smear due in 2021 Next mammogram due this year versus 2019 depending on which guidelines to use. Last colonoscopy January 2016, given a family history of colon cancer, due in 2021 Vaccinations are up-to-date Given lipid panel has been stable over the last several years, will defer this year given the patient is not fasting    NECK PAIN Chronic issue for the patient, seems to be secondary to muscle spasms. -Transition from Flexeril to baclofen to see if this helps with drowsiness

## 2017-01-30 NOTE — Assessment & Plan Note (Signed)
Stable.  Refilled amlodipine and lisinopril-hydrochlorothiazide BMP today Patient to follow-up with her new PCP in 6 months

## 2017-01-30 NOTE — Assessment & Plan Note (Signed)
Chronic issue for the patient, seems to be secondary to muscle spasms. -Transition from Flexeril to baclofen to see if this helps with drowsiness

## 2017-01-30 NOTE — Assessment & Plan Note (Signed)
Concerns for uncontrolled asthma, I suspect this is more persistent and intermittent given the amount of albuterol she is using. -Continue Singulair -Continue Flonase and Zyrtec -Patient advised to schedule an appointment with Dr. Valentina Lucks for PFTs to determine whether she would benefit from a controller inhaler

## 2017-01-30 NOTE — Assessment & Plan Note (Signed)
Next Pap smear due in 2021 Next mammogram due this year versus 2019 depending on which guidelines to use. Last colonoscopy January 2016, given a family history of colon cancer, due in 2021 Vaccinations are up-to-date Given lipid panel has been stable over the last several years, will defer this year given the patient is not fasting

## 2017-01-30 NOTE — Patient Instructions (Addendum)
It was good to see you again.  Instead of Flexeril, try baclofen for your muscle spasms and see if it helps with the morning drowsiness.  You are due for a mammogram either this year or next year depending on whether you rather have it done every 1 or 2 years  Your repeat Pap smear is due in 2021  Continue with your current medications for blood pressure, I will refill these and check your kidneys and electrolytes.  You were using albuterol too much, please schedule an appointment with Dr. Valentina Lucks for pulmonary function tests. You may need a controller inhaler.  Follow up with your new PCP in 6 months.   Things to do to Keep yourself Healthy  - Exercise at least 30-45 minutes a day,  3-4 days a week.  - Eat a low-fat diet with lots of fruits and vegetables, up to 7-9 servings per day. - Seatbelts can save your life. Wear them always. - Smoke detectors on every level of your home, check batteries every year. - Eye Doctor - have an eye exam every 1-2 years - Safe sex - if you may be exposed to STDs, use a condom. - Alcohol- If you drink, do it moderately,less than 2 drinks per day. - [place].  Choose someone to speak for you if you are not able. - Depression is common in our stressful world. If you're feeling down or losing interest in things you normally enjoy, please come in for a visit.

## 2017-01-31 ENCOUNTER — Encounter: Payer: Self-pay | Admitting: Family Medicine

## 2017-01-31 LAB — BASIC METABOLIC PANEL
BUN / CREAT RATIO: 20 (ref 12–28)
BUN: 20 mg/dL (ref 8–27)
CHLORIDE: 99 mmol/L (ref 96–106)
CO2: 25 mmol/L (ref 20–29)
Calcium: 9.8 mg/dL (ref 8.7–10.3)
Creatinine, Ser: 0.99 mg/dL (ref 0.57–1.00)
GFR calc non Af Amer: 62 mL/min/{1.73_m2} (ref >59)
GFR, EST AFRICAN AMERICAN: 72 mL/min/{1.73_m2} (ref >59)
Glucose: 102 mg/dL — ABNORMAL HIGH (ref 65–99)
Potassium: 4.2 mmol/L (ref 3.5–5.2)
Sodium: 140 mmol/L (ref 134–144)

## 2017-03-13 MED FILL — AMLODIPINE BESYLATE 5 MG TA: 5 | 90 days supply | Qty: 90 | Fill #0

## 2017-03-13 MED FILL — LISINOPRIL-HCTZ 20-25 MG TA: 20-25 | 90 days supply | Qty: 90 | Fill #0

## 2017-07-03 MED FILL — LISINOPRIL-HCTZ 20-25 MG TA: 20-25 | 90 days supply | Qty: 90 | Fill #1

## 2017-07-03 MED FILL — AMLODIPINE BESYLATE 5 MG TA: 5 | 90 days supply | Qty: 90 | Fill #1

## 2017-07-03 MED FILL — MONTELUKAST SOD 10 MG TAB: 10 | 90 days supply | Qty: 90 | Fill #1

## 2017-07-03 MED FILL — VENTOLIN HFA 90 MCG INHALER: 108 (90 BAS | 25 days supply | Qty: 18 | Fill #1

## 2017-07-03 MED FILL — BACLOFEN 10 MG TABLET: 10 | 10 days supply | Qty: 30 | Fill #1

## 2017-08-20 DIAGNOSIS — Z8742 Personal history of other diseases of the female genital tract: Secondary | ICD-10-CM

## 2017-08-20 HISTORY — DX: Personal history of other diseases of the female genital tract: Z87.42

## 2017-11-25 MED FILL — AMLODIPINE BESYLATE 5 MG TA: 5 | 90 days supply | Qty: 90 | Fill #2

## 2017-11-25 MED FILL — CETIRIZINE HCL 10 MG TABS: 10 | 90 days supply | Qty: 90 | Fill #0

## 2017-11-25 MED FILL — LISINOPRIL-HCTZ 20-25 MG TA: 20-25 | 90 days supply | Qty: 90 | Fill #2

## 2017-11-25 MED FILL — FLUTICASONE PROP 50 MCG SPR: 50 | 30 days supply | Qty: 16 | Fill #1

## 2017-11-25 MED FILL — MONTELUKAST SOD 10 MG TAB: 10 | 90 days supply | Qty: 90 | Fill #2

## 2018-01-30 ENCOUNTER — Other Ambulatory Visit: Payer: Self-pay | Admitting: Internal Medicine

## 2018-01-30 ENCOUNTER — Ambulatory Visit (INDEPENDENT_AMBULATORY_CARE_PROVIDER_SITE_OTHER): Payer: 59 | Admitting: Internal Medicine

## 2018-01-30 ENCOUNTER — Encounter: Payer: Self-pay | Admitting: Internal Medicine

## 2018-01-30 ENCOUNTER — Other Ambulatory Visit (HOSPITAL_COMMUNITY)
Admission: RE | Admit: 2018-01-30 | Discharge: 2018-01-30 | Disposition: A | Discharge status: Home / Self Care | Payer: 59 | Source: Ambulatory Visit | Attending: Family Medicine | Admitting: Family Medicine

## 2018-01-30 VITALS — BP 130/80 | HR 99 | Temp 98.8°F | Wt 208.4 lb

## 2018-01-30 DIAGNOSIS — I10 Essential (primary) hypertension: Secondary | ICD-10-CM | POA: Insufficient documentation

## 2018-01-30 DIAGNOSIS — J309 Allergic rhinitis, unspecified: Secondary | ICD-10-CM

## 2018-01-30 DIAGNOSIS — D509 Iron deficiency anemia, unspecified: Secondary | ICD-10-CM | POA: Diagnosis not present

## 2018-01-30 DIAGNOSIS — Z1231 Encounter for screening mammogram for malignant neoplasm of breast: Secondary | ICD-10-CM

## 2018-01-30 DIAGNOSIS — Z0000 Encounter for general adult medical examination without abnormal findings: Secondary | ICD-10-CM

## 2018-01-30 DIAGNOSIS — M542 Cervicalgia: Secondary | ICD-10-CM

## 2018-01-30 DIAGNOSIS — Z1239 Encounter for other screening for malignant neoplasm of breast: Secondary | ICD-10-CM

## 2018-01-30 MED ORDER — LISINOPRIL-HYDROCHLOROTHIAZIDE 20-25 MG PO TABS: 1.0000 | ORAL_TABLET | Freq: Every day | ORAL | 2 refills | Status: DC

## 2018-01-30 MED ORDER — FLUTICASONE PROPIONATE 50 MCG/ACT NA SUSP: 2.0000 | Freq: Every day | NASAL | 1 refills | Status: DC

## 2018-01-30 MED ORDER — BACLOFEN 10 MG PO TABS: 10.0000 mg | ORAL_TABLET | Freq: Three times a day (TID) | ORAL | 1 refills | Status: DC | PRN

## 2018-01-30 MED ORDER — AMLODIPINE BESYLATE 5 MG PO TABS: 5.0000 mg | ORAL_TABLET | Freq: Every day | ORAL | 2 refills | Status: DC

## 2018-01-30 MED ORDER — IBUPROFEN 800 MG PO TABS
800.0000 mg | ORAL_TABLET | Freq: Three times a day (TID) | ORAL | 0 refills | Status: DC | PRN
Start: 2018-01-30 — End: 2019-02-19

## 2018-01-30 MED FILL — FLUTICASONE PROP 50 MCG SPR: 50 | 30 days supply | Qty: 16 | Fill #0

## 2018-01-30 MED FILL — IBUPROFEN 800 MG TAB: 800 | 10 days supply | Qty: 30 | Fill #0

## 2018-01-30 MED FILL — BACLOFEN 10 MG TABLET: 10 | 10 days supply | Qty: 30 | Fill #0

## 2018-01-30 NOTE — Assessment & Plan Note (Signed)
Chronic Neck Pain  Refilled Ibuprofen and baclofen

## 2018-01-30 NOTE — Patient Instructions (Signed)
It was nice meeting you today.  You will need to get a mammogram.  I have refilled all your medications and we will do some blood work today.  Follow-up in the next 6 months.

## 2018-01-30 NOTE — Assessment & Plan Note (Signed)
Mammogram and Pap smear

## 2018-01-30 NOTE — Assessment & Plan Note (Signed)
Well controlled  Continue meds BMET and Lipid panel

## 2018-01-30 NOTE — Progress Notes (Signed)
Lacey Barron is a 62 y.o. female presents to office today for annual physical exam examination.  Concerns today include:   CHRONIC HTN: Current Meds - lisinopril-HCTZ and Norvasc  Reports good compliance, took meds today. Tolerating well, w/o complaints. Denies CP, dyspnea, HA, edema, dizziness / lightheadedness   Last eye exam: yearly, has an appointment  Last dental exam: last year Last colonoscopy: normal Last mammogram: due  Last pap smear: due  Immunizations needed: None  Refills needed today: All   Women's Health  Periods: 2 years stopped getting periods Pelvic symptoms: none  Sexual activity: yes  STD Screening: not interested  Exercise: Discussed  Diet: eats plenty of fruits and veggies Smoking: None Alcohol: None Drugs: None  Advance directives: Discussed, states her son and daughter know her wishes  Mood: No concern about depression or SI, anxiety    Past Medical History:  Diagnosis Date  . Allergy   . Anemia   . Arthritis   . Asthma   . Cataract    are starting to form  . Hypertension   . Sickle cell trait ([place])    Social History   Socioeconomic History  . Marital status: Divorced    Spouse name: Not on file  . Number of children: Not on file  . Years of education: Not on file  . Highest education level: Not on file  Occupational History  . Not on file  Social Needs  . Financial resource strain: Not on file  . Food insecurity:    Worry: Not on file    Inability: Not on file  . Transportation needs:    Medical: Not on file    Non-medical: Not on file  Tobacco Use  . Smoking status: Never Smoker  . Smokeless tobacco: Never Used  Substance and Sexual Activity  . Alcohol use: No    Alcohol/week: 0.0 oz  . Drug use: No  . Sexual activity: Not on file  Lifestyle  . Physical activity:    Days per week: Not on file    Minutes per session: Not on file  . Stress: Not on file  Relationships  . Social connections:    Talks on phone:  Not on file    Gets together: Not on file    Attends religious service: Not on file    Active member of club or organization: Not on file    Attends meetings of clubs or organizations: Not on file    Relationship status: Not on file  . Intimate partner violence:    Fear of current or ex partner: Not on file    Emotionally abused: Not on file    Physically abused: Not on file    Forced sexual activity: Not on file  Other Topics Concern  . Not on file  Social History Narrative  . Not on file   Past Surgical History:  Procedure Laterality Date  . COLONOSCOPY    . TUBAL LIGATION     Family History  Problem Relation Age of Onset  . Diabetes Mother   . Kidney disease Mother   . Colon cancer Father   . Hypertension Mother     ROS: Review of Systems Indicates muscle spasms in the neck and sometimes in the foot from previous injuries  Patient reports no  vision/ hearing changes,anorexia, weight change, fever ,adenopathy, persistant / recurrent hoarseness, swallowing issues, chest pain, edema,persistant / recurrent cough, hemoptysis, dyspnea(rest, exertional, paroxysmal nocturnal), gastrointestinal  bleeding (melena, rectal bleeding), abdominal  pain, excessive heart burn, GU symptoms(dysuria, hematuria, pyuria, voiding/incontinence  Issues) syncope, focal weakness, severe memory loss, concerning skin lesions, depression, anxiety, abnormal bruising/bleeding, major joint swelling, breast masses or abnormal vaginal bleeding.    Physical exam Physical Exam  Constitutional: She is oriented to person, place, and time. She appears well-developed and well-nourished.  HENT:  Head: Normocephalic and atraumatic.  Eyes: Pupils are equal, round, and reactive to light. EOM are normal.  Neck: Normal range of motion. Neck supple.  Cardiovascular: Normal rate.  Pulmonary/Chest: Effort normal and breath sounds normal.  Abdominal: Soft. Bowel sounds are normal.  Genitourinary: Vagina normal and uterus  normal.  Musculoskeletal: Normal range of motion.  Neurological: She is alert and oriented to person, place, and time.  Skin: Skin is warm. Capillary refill takes less than 2 seconds.  Psychiatric: She has a normal mood and affect.     Assessment/ Plan: Patient with here for annual physical exam.   HYPERTENSION, BENIGN, MILD Well controlled  Continue meds BMET and Lipid panel   Screening for breast cancer Mammogram and Pap smear   NECK PAIN Chronic Neck Pain  Refilled Ibuprofen and baclofen     Kiyani Jernigan PGY-3, [place]

## 2018-01-31 LAB — BASIC METABOLIC PANEL
BUN/Creatinine Ratio: 24 (ref 12–28)
BUN: 18 mg/dL (ref 8–27)
CALCIUM: 9.2 mg/dL (ref 8.7–10.3)
CO2: 24 mmol/L (ref 20–29)
CREATININE: 0.76 mg/dL (ref 0.57–1.00)
Chloride: 106 mmol/L (ref 96–106)
GFR calc Af Amer: 98 mL/min/{1.73_m2} (ref >59)
GFR, EST NON AFRICAN AMERICAN: 85 mL/min/{1.73_m2} (ref >59)
Glucose: 123 mg/dL — ABNORMAL HIGH (ref 65–99)
POTASSIUM: 3.9 mmol/L (ref 3.5–5.2)
Sodium: 145 mmol/L — ABNORMAL HIGH (ref 134–144)

## 2018-01-31 LAB — CBC
HEMATOCRIT: 34.6 % (ref 34.0–46.6)
HEMOGLOBIN: 11.7 g/dL (ref 11.1–15.9)
MCH: 24.4 pg — ABNORMAL LOW (ref 26.6–33.0)
MCHC: 33.8 g/dL (ref 31.5–35.7)
MCV: 72 fL — ABNORMAL LOW (ref 79–97)
Platelets: 237 10*3/uL (ref 150–450)
RBC: 4.8 x10E6/uL (ref 3.77–5.28)
RDW: 19.2 % — ABNORMAL HIGH (ref 12.3–15.4)
WBC: 5 10*3/uL (ref 3.4–10.8)

## 2018-01-31 LAB — LIPID PANEL
Chol/HDL Ratio: 3.7 ratio (ref 0.0–4.4)
Cholesterol, Total: 126 mg/dL (ref 100–199)
HDL: 34 mg/dL — AB (ref >39)
LDL Calculated: 77 mg/dL (ref 0–99)
Triglycerides: 75 mg/dL (ref 0–149)
VLDL Cholesterol Cal: 15 mg/dL (ref 5–40)

## 2018-02-03 LAB — CYTOLOGY - PAP
DIAGNOSIS: NEGATIVE
HPV: DETECTED — AB

## 2018-02-05 ENCOUNTER — Telehealth: Payer: Self-pay | Admitting: Internal Medicine

## 2018-02-05 ENCOUNTER — Encounter: Payer: Self-pay | Admitting: Internal Medicine

## 2018-02-05 NOTE — Telephone Encounter (Signed)
Called and left a message stating that I want to do discuss the results of patient's Pap smear with her.  She can either call the clinic tomorrow or I will give her a call back.  If I am not able to get in touch with her I will send her a letter in the mail.

## 2018-02-06 ENCOUNTER — Ambulatory Visit
Admission: RE | Admit: 2018-02-06 | Discharge: 2018-02-06 | Disposition: A | Discharge status: Home / Self Care | Payer: 59 | Source: Ambulatory Visit | Attending: Family Medicine | Admitting: Family Medicine

## 2018-02-06 DIAGNOSIS — Z1231 Encounter for screening mammogram for malignant neoplasm of breast: Secondary | ICD-10-CM

## 2018-02-07 ENCOUNTER — Other Ambulatory Visit: Payer: Self-pay | Admitting: Family Medicine

## 2018-02-07 DIAGNOSIS — R928 Other abnormal and inconclusive findings on diagnostic imaging of breast: Secondary | ICD-10-CM

## 2018-02-10 ENCOUNTER — Encounter: Payer: Self-pay | Admitting: Internal Medicine

## 2018-02-10 DIAGNOSIS — B977 Papillomavirus as the cause of diseases classified elsewhere: Secondary | ICD-10-CM | POA: Insufficient documentation

## 2018-02-11 ENCOUNTER — Ambulatory Visit
Admission: RE | Admit: 2018-02-11 | Discharge: 2018-02-11 | Disposition: A | Discharge status: Home / Self Care | Payer: 59 | Source: Ambulatory Visit | Attending: Family Medicine | Admitting: Family Medicine

## 2018-02-11 ENCOUNTER — Ambulatory Visit: Payer: 59

## 2018-02-11 DIAGNOSIS — R922 Inconclusive mammogram: Secondary | ICD-10-CM | POA: Diagnosis not present

## 2018-02-11 DIAGNOSIS — R928 Other abnormal and inconclusive findings on diagnostic imaging of breast: Secondary | ICD-10-CM

## 2018-02-21 ENCOUNTER — Ambulatory Visit: Payer: 59

## 2018-03-28 ENCOUNTER — Other Ambulatory Visit: Payer: Self-pay

## 2018-03-28 DIAGNOSIS — R059 Cough, unspecified: Secondary | ICD-10-CM

## 2018-03-28 DIAGNOSIS — R05 Cough: Secondary | ICD-10-CM

## 2018-03-28 MED ORDER — ALBUTEROL SULFATE HFA 108 (90 BASE) MCG/ACT IN AERS: 2.0000 | INHALATION_SPRAY | RESPIRATORY_TRACT | 6 refills | Status: DC | PRN

## 2018-03-28 MED FILL — FLUTICASONE PROP 50 MCG SPR: 50 | 30 days supply | Qty: 16 | Fill #1

## 2018-03-28 MED FILL — AMLODIPINE BESYLATE 5 MG TA: 5 | 90 days supply | Qty: 90 | Fill #0

## 2018-03-28 MED FILL — BACLOFEN 10 MG TABLET: 10 | 10 days supply | Qty: 30 | Fill #1

## 2018-03-28 MED FILL — LISINOPRIL-HCTZ 20-25 MG TA: 20-25 | 90 days supply | Qty: 90 | Fill #0

## 2018-03-31 MED FILL — VENTOLIN HFA 90 MCG INHALER: 108 (90 BAS | 17 days supply | Qty: 18 | Fill #0

## 2018-07-14 ENCOUNTER — Other Ambulatory Visit: Payer: Self-pay

## 2018-07-14 ENCOUNTER — Other Ambulatory Visit: Payer: Self-pay | Admitting: Internal Medicine

## 2018-07-14 DIAGNOSIS — J309 Allergic rhinitis, unspecified: Secondary | ICD-10-CM

## 2018-07-14 MED FILL — VENTOLIN HFA 90 MCG INHALER: 108 (90 BAS | 17 days supply | Qty: 18 | Fill #1

## 2018-07-14 MED FILL — LISINOPRIL-HCTZ 20-25 MG TA: 20-25 | 90 days supply | Qty: 90 | Fill #1

## 2018-07-14 MED FILL — FLUTICASONE PROP 50 MCG SPR: 50 | 30 days supply | Qty: 16 | Fill #0

## 2018-07-14 MED FILL — AMLODIPINE BESYLATE 5 MG TA: 5 | 90 days supply | Qty: 90 | Fill #1

## 2018-07-15 DIAGNOSIS — H524 Presbyopia: Secondary | ICD-10-CM | POA: Diagnosis not present

## 2018-07-15 DIAGNOSIS — H5213 Myopia, bilateral: Secondary | ICD-10-CM | POA: Diagnosis not present

## 2018-07-15 DIAGNOSIS — H52223 Regular astigmatism, bilateral: Secondary | ICD-10-CM | POA: Diagnosis not present

## 2018-07-15 MED ORDER — MONTELUKAST SODIUM 10 MG PO TABS: 10.0000 mg | ORAL_TABLET | Freq: Every day | ORAL | 2 refills | Status: DC

## 2018-07-15 MED FILL — MONTELUKAST SOD 10 MG TAB: 10 | 90 days supply | Qty: 90 | Fill #0

## 2018-10-17 ENCOUNTER — Other Ambulatory Visit (HOSPITAL_COMMUNITY)
Admission: RE | Admit: 2018-10-17 | Discharge: 2018-10-17 | Disposition: A | Discharge status: Home / Self Care | Payer: 59 | Source: Ambulatory Visit | Attending: Family Medicine | Admitting: Family Medicine

## 2018-10-17 ENCOUNTER — Telehealth: Payer: Self-pay | Admitting: Family Medicine

## 2018-10-17 ENCOUNTER — Telehealth: Payer: Self-pay

## 2018-10-17 ENCOUNTER — Ambulatory Visit (INDEPENDENT_AMBULATORY_CARE_PROVIDER_SITE_OTHER): Payer: 59 | Admitting: Family Medicine

## 2018-10-17 VITALS — BP 130/80 | HR 82 | Temp 98.8°F | Wt 206.4 lb

## 2018-10-17 DIAGNOSIS — N950 Postmenopausal bleeding: Secondary | ICD-10-CM | POA: Insufficient documentation

## 2018-10-17 DIAGNOSIS — N644 Mastodynia: Secondary | ICD-10-CM

## 2018-10-17 DIAGNOSIS — B9689 Other specified bacterial agents as the cause of diseases classified elsewhere: Secondary | ICD-10-CM

## 2018-10-17 DIAGNOSIS — N760 Acute vaginitis: Secondary | ICD-10-CM

## 2018-10-17 LAB — POCT WET PREP (WET MOUNT)
Clue Cells Wet Prep Whiff POC: POSITIVE
Trichomonas Wet Prep HPF POC: ABSENT

## 2018-10-17 MED ORDER — METRONIDAZOLE 500 MG PO TABS: 500.0000 mg | ORAL_TABLET | Freq: Two times a day (BID) | ORAL | 0 refills | Status: DC

## 2018-10-17 NOTE — Progress Notes (Signed)
  Patient Name: Lacey Barron Date of Birth: 30-Jul-1956 Date of Visit: 10/17/18 PCP: Caroline More, DO  Chief Complaint: post menopausal bleeding   Subjective: Lacey Barron is a pleasant 63 y.o. woman with history of hypertension presenting 4 days of vaginal bleeding.  Menopause occurred at age 53. She has not had bleeding since that time. She is sexually active with her husband of many years. Last had intercourse on Saturday (seven days ago). She noted the onset of acne about 1 week ago followed by breast tenderness. Vaginal bleeding began Monday (five days ago) at work (works at [employer]). Daughter is FNP, recommended she be seen to further evaluate. She does not take any anticoagulants or antiplatelets. No family history of breast, ovarian, or uterine cancer. She has been having intermittent hot flashes. No headaches, galactorrhea. No breast lumps or masses. Breast tenderness has improved as bleeding has largely subsided.   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   ROS: As above.   I have reviewed the patient's medical, surgical, family, and social history as appropriate.   Vitals:   10/17/18 0932  BP: 130/80  Pulse: 82  Temp: 98.8 F (37.1 C)  SpO2: 98%   Filed Weights   10/17/18 0932  Weight: 206 lb 6.4 oz (93.6 kg)   Cardiac: Warm well perfused.  Capillary refill less than 3 seconds Respiratory breathing comfortably on room air Psych: Pleasant normal affect, appropriate, normal rate of speech GU Exam:  Chaperoned exam.  External exam: Normal-appearing female external genitalia.  Vaginal exam notable for small amount of blood.  Cervix without discharge or obvious lesion- blood at os, trace.  Bimanual exam reveals normal sized uterus no masses appreciated, no pain with examination.   Lacey Barron was seen today for vaginal bleeding.  Diagnoses and all orders for this visit:  Postmenopausal bleeding, differential is broad,  discussed with patient, possibly atropic, polyp, hyperplasia. Mastalgia and acne are less common symptoms with these---will check below hormones. Referral to Gynecology clinic for EMB. Discussed performing in office with patient today- prefers to come back when prepared (will take ibuprofen pre-exam)  -     US Pelvic Complete With Transvaginal; Future -     TSH -     POCT Wet Prep [facility]) -     Cervicovaginal ancillary only -     Prolactin- given mastalgia, could also consider repeat early mammogram (due in June)   Will call with results of wet mount after visit (resulted late).   Lacey Singh, MD  Family Medicine Teaching Service

## 2018-10-17 NOTE — Patient Instructions (Signed)
It was wonderful to see you today.  Thank you for choosing [facility].   Please call [phone] with any questions about today's appointment.  Please be sure to schedule follow up at the front  desk before you leave today.   Dorris Singh, MD  Family Medicine   Schedule follow up in our Gynecology clinic for a biopsy  Take 600 mg of Ibuprofen (also called Motrin) before this exam--about an hour before the visit

## 2018-10-17 NOTE — Telephone Encounter (Signed)
Attempted to call patient to inform of appointment for ultrasound.  Thursday 10/23/2018 4:00 pm with show time of 3:45 pm [place] [id number] [phone] Drink 32 oz of water 1 hour prior to appointment Do not void  .Ozella Almond, CMA

## 2018-10-17 NOTE — Telephone Encounter (Signed)
Called patient with results (resulted after visit). + Whiff and Clue cells. Rx for flagyl for 7 days sent to pharmacy (she will start on Monday).   Dorris Singh, MD  Family Medicine Teaching Service

## 2018-10-17 NOTE — Telephone Encounter (Signed)
Patient informed of appointment.  Patient verbalized understanding.  Lacey Barron, [place]

## 2018-10-18 LAB — PROLACTIN: Prolactin: 6.6 ng/mL (ref 4.8–23.3)

## 2018-10-18 LAB — TSH: TSH: 2.17 u[IU]/mL (ref 0.450–4.500)

## 2018-10-18 MED FILL — metroNIDAZOLE 500 MG TABS: 500 | 7 days supply | Qty: 14 | Fill #0

## 2018-10-20 ENCOUNTER — Encounter: Payer: Self-pay | Admitting: Family Medicine

## 2018-10-20 LAB — CERVICOVAGINAL ANCILLARY ONLY
Chlamydia: NEGATIVE
NEISSERIA GONORRHEA: NEGATIVE

## 2018-10-23 ENCOUNTER — Ambulatory Visit (INDEPENDENT_AMBULATORY_CARE_PROVIDER_SITE_OTHER): Payer: 59 | Admitting: Family Medicine

## 2018-10-23 ENCOUNTER — Other Ambulatory Visit: Payer: Self-pay

## 2018-10-23 ENCOUNTER — Ambulatory Visit
Admission: RE | Admit: 2018-10-23 | Discharge: 2018-10-23 | Disposition: A | Discharge status: Home / Self Care | Payer: 59 | Source: Ambulatory Visit | Attending: Family Medicine | Admitting: Family Medicine

## 2018-10-23 ENCOUNTER — Encounter: Payer: Self-pay | Admitting: Family Medicine

## 2018-10-23 VITALS — BP 120/72 | HR 84 | Temp 98.5°F | Wt 206.0 lb

## 2018-10-23 DIAGNOSIS — N950 Postmenopausal bleeding: Secondary | ICD-10-CM | POA: Diagnosis not present

## 2018-10-23 DIAGNOSIS — N858 Other specified noninflammatory disorders of uterus: Secondary | ICD-10-CM | POA: Diagnosis not present

## 2018-10-23 DIAGNOSIS — D251 Intramural leiomyoma of uterus: Secondary | ICD-10-CM | POA: Diagnosis not present

## 2018-10-23 NOTE — Patient Instructions (Signed)

## 2018-10-23 NOTE — Progress Notes (Signed)
Endometrial Biopsy Procedure Note  Pre-operative Diagnosis: AUB  Post-operative Diagnosis: same  Indications: abnormal uterine bleeding  Procedure Details   Urine pregnancy test was not done.  The risks (including infection, bleeding, pain, and uterine perforation) and benefits of the procedure were explained to the patient and Written informed consent was obtained.  Antibiotic prophylaxis against endocarditis was not indicated.   The patient was placed in the dorsal lithotomy position.  Bimanual exam showed the uterus to be in the neutral position.  A Graves' speculum inserted in the vagina, and the cervix prepped with povidone iodine.  Endocervical curettage with a Kevorkian curette was not performed.   A sharp tenaculum was applied to the anterior lip of the cervix for stabilization.  A sterile uterine sound was used to sound the uterus to a depth of 5cm.  A Tis-u-Trap endometrial curette was used to sample the endometrium.  Sample was sent for pathologic examination.  Condition: Stable  Complications: None  Plan:  The patient was advised to call for any fever or for prolonged or severe pain or bleeding. She was advised to use OTC acetaminophen as needed for mild to moderate pain. She was advised to avoid vaginal intercourse for 48 hours or until the bleeding has completely stopped.  Sherene Sires, DO  PGY2 [place] 10/23/2018 9:46 AM

## 2018-10-24 ENCOUNTER — Telehealth: Payer: Self-pay | Admitting: Family Medicine

## 2018-10-24 DIAGNOSIS — N950 Postmenopausal bleeding: Secondary | ICD-10-CM

## 2018-10-24 NOTE — Telephone Encounter (Signed)
I was unable to reach the patient. HIPAA compliant call back message left.   Please let her know that her endometrial biopsy report if normal whenever she calls back. Have her follow-up with her PCP or Dr. Owens Shark to discuss ultrasound report.

## 2018-10-24 NOTE — Telephone Encounter (Signed)
Called patient with results.  As the biopsy was normal and the ultrasound does show thickening of the endometrium I recommended further evaluation of with gynecology for possible SIS.   Discussed with patient, all questions answered, referral placed.   Dorris Singh, MD  Family Medicine Teaching Service

## 2018-10-28 ENCOUNTER — Ambulatory Visit: Payer: 59 | Admitting: Obstetrics & Gynecology

## 2018-10-28 ENCOUNTER — Encounter: Payer: Self-pay | Admitting: Obstetrics & Gynecology

## 2018-10-28 ENCOUNTER — Other Ambulatory Visit: Payer: Self-pay

## 2018-10-28 VITALS — BP 130/76 | HR 80 | Resp 16 | Ht 64.5 in | Wt 202.0 lb

## 2018-10-28 DIAGNOSIS — N950 Postmenopausal bleeding: Secondary | ICD-10-CM | POA: Diagnosis not present

## 2018-10-28 DIAGNOSIS — R9389 Abnormal findings on diagnostic imaging of other specified body structures: Secondary | ICD-10-CM | POA: Diagnosis not present

## 2018-10-28 DIAGNOSIS — D251 Intramural leiomyoma of uterus: Secondary | ICD-10-CM

## 2018-10-28 NOTE — Progress Notes (Signed)
63 y.o. [id number] Married Lacey Barron here for consultation and additional recommendations regarinding and episode of PMP bleeding that she had in late February.  Bleeding occurred on 2/23 and lasted for about five days.  Bleeding was bright red.  It was preceeded by acne and breast tenderness.  She was initially seen by Dr. Owens Barron.   TSH, prolactin were obtained and normal.  PUS was ordered.  Vaginitis testing was obtained as well as STD testing.  GC/Chl was negative.  She was treated for BV.    PUS showed:  10.6 x 5.0 x 7.0cm uterus with <1cm intramural fibroid a swell as another 2.0cm fibroid Endometrium: 8.55mm Right ovary:  Not visualized Left ovary:  3.2 x 1.8 x 1.3cm  Pt returned for endometrial biopsy with Dr. Criss Barron.  Biopsy note showed that the endometrial pipelle only passed to 5cm when biopsy was obtained.  Degenerating secretory type endometrium was noted.  Bleeding has stopped.  Acne and breast tenderness have resolved.    Of note:  Pt did have a pap smear that was negative with +HR HPV.  She did not have 16/18/45 testing.    Patient's last menstrual period was 10/14/2013.          Sexually active: Yes.    The current method of family planning is post menopausal status.    Exercising: Yes.    walking Smoker:  no  Health Maintenance: Pap:  01/30/18 Neg. HR HPV:+detected    09/06/14 Neg. HR HPV:neg  History of abnormal Pap:  no MMG:  02/11/18 Diagnostic Right BIRADS1:Neg. F/u 1 year  Colonoscopy:  08/27/14 f/u 5 years  BMD:   never TDaP:  2009 Pneumonia vaccine(s):  n/a Shingrix:   No Hep C testing: 10/11/15 neg  Screening Labs: PCP   reports that she has never smoked. She has never used smokeless tobacco. She reports that she does not drink alcohol or use drugs.  Past Medical History:  Diagnosis Date  . Allergy   . Anemia   . Arthritis   . Asthma   . Cataract    are starting to form  . Fibroid   . Hypertension   . Pap smear abnormality of cervix/human  papillomavirus (HPV) positive 01/30/2018  . Sickle cell trait ([place])   . Urinary incontinence     Past Surgical History:  Procedure Laterality Date  . COLONOSCOPY    . TUBAL LIGATION      Current Outpatient Medications  Medication Sig Dispense Refill  . albuterol (PROVENTIL HFA;VENTOLIN HFA) 108 (90 Base) MCG/ACT inhaler Inhale 2 puffs into the lungs every 4 (four) hours as needed for wheezing or shortness of breath. 1 each 6  . amLODipine (NORVASC) 5 MG tablet Take 1 tablet (5 mg total) by mouth daily. 90 tablet 2  . baclofen (LIORESAL) 10 MG tablet Take 1 tablet (10 mg total) by mouth 3 (three) times daily as needed for muscle spasms. 30 each 1  . cetirizine (ZYRTEC) 10 MG tablet Take 1 tablet (10 mg total) by mouth daily. 90 tablet 1  . Cyanocobalamin (VITAMIN B-12) 1000 MCG SUBL Place 1 tablet under the tongue once.    . fluticasone (FLONASE) 50 MCG/ACT nasal spray Place 2 sprays into both nostrils daily.    Marland Kitchen ibuprofen (ADVIL,MOTRIN) 800 MG tablet Take 1 tablet (800 mg total) by mouth every 8 (eight) hours as needed. 30 tablet 0  . lisinopril-hydrochlorothiazide (PRINZIDE,ZESTORETIC) 20-25 MG tablet Take 1 tablet by mouth daily. 90 tablet 2  .  montelukast (SINGULAIR) 10 MG tablet Take 1 tablet (10 mg total) by mouth at bedtime. 90 tablet 2   No current facility-administered medications for this visit.     Family History  Problem Relation Age of Onset  . Diabetes Mother   . Kidney disease Mother   . Hypertension Mother   . Colon cancer Father   . Heart attack Brother   . Diabetes Brother     Review of Systems  All other systems reviewed and are negative.   Exam:   BP 130/76 (BP Location: Right Arm, Patient Position: Sitting, Cuff Size: Large)   Pulse 80   Resp 16   Ht 5' 4.5" (1.638 m)   Wt 202 lb (91.6 kg)   LMP 10/14/2013   BMI 34.14 kg/m   Height:   Height: 5' 4.5" (163.8 cm)  Ht Readings from Last 3 Encounters:  10/28/18 5' 4.5" (1.638 m)  01/30/17 5\' 6"   (1.676 m)  11/07/15 5\' 6"  (1.676 m)   Discussion: Finding reviewed with pt.  Discussed with pt that likely endometrial biopsy was not within the endometrial cavity but given 94mm endometrial thickness, there may an endometrial biopsy there as well.  I would recommend either repeating the biopsy, proceeding with sonohysterography for additional endometrial evaluation or hysteroscopy with resection of any endometrial lesions.  She is tired of evaluation and would just like resolution.  She would desire to proceed with hysteroscopy.    Procedure discussed with patient.  Recovery and pain management discussed.  Risks discussed including but not limited to bleeding, rare risk of transfusion, infection, 1% risk of uterine perforation with risks of fluid deficit causing cardiac arrythmia, cerebral swelling and/or need to stop procedure early.  Fluid emboli and rare risk of death discussed.  DVT/PE, rare risk of risk of bowel/bladder/ureteral/vascular injury.  Patient aware if pathology abnormal she may need additional treatment.  All questions answered.    General appearance: alert, cooperative and appears stated age Head: Normocephalic, without obvious abnormality, atraumatic Lungs: clear to auscultation bilaterally Heart: regular rate and rhythm  A:  PMP bleeding 41mm endometrial thickness Uterine fibroids H/o neg pap with +HR HPV   P:   Will proceed with hysteroscopy and resection of any endometrial lesion as well as D&C She does need repeat pap and HR HPV with 16/18/45 and this is due in June.  Could plan to repeat in the OR or in June when this is due.    ~30 minutes spent with patient >50% of time was in face to face discussion of above.

## 2018-10-31 ENCOUNTER — Encounter: Payer: Self-pay | Admitting: Obstetrics & Gynecology

## 2018-11-03 ENCOUNTER — Telehealth: Payer: Self-pay | Admitting: Obstetrics & Gynecology

## 2018-11-03 NOTE — Telephone Encounter (Signed)
Call placed to patient to review benefits for recommended surgery. Left voicemail message requesting a return call

## 2018-11-12 MED FILL — AMLODIPINE BESYLATE 5 MG TA: 5 | 90 days supply | Qty: 90 | Fill #0

## 2018-11-12 MED FILL — ALBUTEROL SULFATE HFA 108 (: 108 (90 BAS | 17 days supply | Qty: 18 | Fill #0

## 2018-11-12 MED FILL — LISINOPRIL-HCTZ 20-25 MG TA: 20-25 | 90 days supply | Qty: 90 | Fill #0

## 2019-01-02 ENCOUNTER — Telehealth: Payer: Self-pay | Admitting: *Deleted

## 2019-01-02 DIAGNOSIS — R9389 Abnormal findings on diagnostic imaging of other specified body structures: Secondary | ICD-10-CM

## 2019-01-02 DIAGNOSIS — N950 Postmenopausal bleeding: Secondary | ICD-10-CM

## 2019-01-02 NOTE — Telephone Encounter (Signed)
Follow-up call to patient. Per DPR, can leave message on voice mail which has name confirmation. Left message advising able to schedule elective surgery following Covid 19 restrictions.  Dates available as early as first week of June. Left message to call back.

## 2019-01-26 NOTE — Telephone Encounter (Signed)
Spoke with patient in regards to benefits for surgery. Patient understood information presented. Patient is ready tp proceed with scheduling. Patient requesting to schedule the week of July 13th. Wendelyn Breslow to Conservation officer, historic buildings for scheudling  Routing to [place], [place]

## 2019-02-03 NOTE — Telephone Encounter (Signed)
Call to patient to discuss surgery date options.  Advised 03-02-19 is not available. Patient agreeable to week of 03-09-19.  Reviewed required Covid testing and restrictions.  Patient states she is not willing to "do all of that."  Advised that thickened endometrial lining is not something that can be deferred indefinitely and need some type of plan for follow-up. Offered office visit or Web Ex with Dr Sabra Heck to discuss options. Patient agreeable to this. Advised will review with Dr Sabra Heck first to confirm if ultrasound should be scheduled.      Please advise.

## 2019-02-08 NOTE — Telephone Encounter (Signed)
Please schedule repeat PUS.  It will be easier to discuss if endometrium is still thickened in appearance.  Thanks.

## 2019-02-18 NOTE — Progress Notes (Signed)
Subjective:  CC -- Annual Physical; With complaints of difficulty holding urine  Pt reports she has had difficulty with holding her bladder  Patient reports that she feels the urge to go to the bathroom but is unable to get there in time sometimes.  She says if she gets distracted on her way she will tend to lose her urine.  The symptoms began in January but they have become more prominent now.   Cardiovascular: - Risk as of 02/19/19: The ASCVD Risk score Mikey Bussing DC Jr., et al., 2013) failed to calculate for the following reasons:   The valid total cholesterol range is 130 to 320 mg/dL - Dx Hypertension: yes, Current medications include amlodipine 5 mg - Dx Hyperlipidemia: no   - Dx Obesity: yes, Class II - Physical Activity: yes, walks 5000 steps a day  - Diabetes: no    Cancer: Colorectal >> Colonoscopy: yes, normal colonoscopy in 2016 but recommended for repeat in 5 years 2/2 strong family history   Lung >> Tobacco Use: no   Breast >> Mammogram: yes, assymetry of right breast in 2019 recommended for repeat diagnostic mammo vs. [place]  Cervical/Endometrial >>  - Postmenopausal: yes  - Vaginal Bleeding: no - Pap Smear: yes, abnormal in 2019. Advised for 1 year f/u pap   - Previous Abnormal Pap: yes, +HPV in 01/2018  Skin >> Suspicious lesions: no   Social: Alcohol Use: no  Tobacco Use: no   - Interested in Quitting: N/A  Other Drugs: no  Risky Sexual Behavior: no  Depression:  no   - PHQ9 score: 1 Depression screen [facility] 2/9 02/19/2019 10/23/2018 01/30/2017 11/07/2015 10/11/2015  Decreased Interest 0 0 0 0 0  Down, Depressed, Hopeless 0 0 0 0 0  PHQ - 2 Score 0 0 0 0 0  Altered sleeping 1 - - - -  Tired, decreased energy 0 - - - -  Change in appetite 0 - - - -  Feeling bad or failure about yourself  0 - - - -  Trouble concentrating 0 - - - -  Moving slowly or fidgety/restless 0 - - - -  Suicidal thoughts 0 - - - -  PHQ-9 Score 1 - - - -  Difficult doing work/chores Not difficult at  all - - - -  Support and Life at Home: yes   Other: Osteoporosis: no  Zoster Vaccine: no, would like to wait till after COVID so she will not be placed out of work  Flu Vaccine: UTD  Pneumonia Vaccine: no   ROS-see above, other ROS neg   Past Medical History Patient Active Problem List   Diagnosis Date Noted   Urinary incontinence 02/19/2019   Healthcare maintenance 02/19/2019   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 18 g 2   amLODipine (NORVASC) 5 MG tablet Take 1 tablet (5 mg total) by mouth daily. 90 tablet 2   baclofen (LIORESAL) 10 MG tablet Take 1 tablet (10 mg total) by mouth 3 (three) times daily as needed for muscle spasms. 30 each 1  cetirizine (ZYRTEC) 10 MG tablet Take 1 tablet (10 mg total) by mouth daily. 90 tablet 1   Cyanocobalamin (VITAMIN B-12) 1000 MCG SUBL Place 1 tablet under the tongue once.     fluticasone (FLONASE) 50 MCG/ACT nasal spray Place 2 sprays into both nostrils daily. 16 g 2   ibuprofen (ADVIL) 800 MG tablet Take 1 tablet (800 mg total) by mouth every 8 (eight) hours as needed. 30 tablet 0   lisinopril-hydrochlorothiazide (PRINZIDE,ZESTORETIC) 20-25 MG tablet Take 1 tablet by mouth daily. 90 tablet 2   montelukast (SINGULAIR) 10 MG tablet Take 1 tablet (10 mg total) by mouth at bedtime. 90 tablet 2   No current facility-administered medications for this visit.     Objective: BP 135/80    Pulse 97    Wt 206 lb 12.8 oz (93.8 kg)    LMP 10/14/2013    SpO2 96%    BMI 34.95 kg/m    Gen: NAD, alert, cooperative with exam HEENT: NCAT, EOMI, PERRL CV: RRR, good S1/S2, no murmur Resp: CTABL, no wheezes, non-labored Abd: Soft, Non Tender, Non Distended, BS present, no guarding or organomegaly Genital Exam: (performed with chaperone in room) normal external genitalia, vulva, vagina, cervix, uterus and adnexa Ext: No edema, warm Neuro: Alert and oriented, No gross deficits   Assessment/Plan:  Urinary incontinence Patient with what seems like urge incontinence.  Advised for Keagle exercises.  Instructions given and after visit summary as well.  Patient to do daily Keagle exercises daily, advised to perform at every stoplight or stop sign.  Follow-up in 1 month.  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  NECK PAIN Chronic.  Refilled ibuprofen  Healthcare maintenance CMP, Lipid panel, and A1C obtained   Orders Placed This Encounter  Procedures   Lipid Panel   Comprehensive metabolic panel    Order Specific Question:   Has the patient fasted?    Answer:   No   HgB A1c    Meds ordered this encounter  Medications   albuterol (VENTOLIN HFA) 108 (90 Base) MCG/ACT inhaler    Sig: Inhale 2 puffs into the lungs every 4 (four) hours as needed for wheezing or shortness of breath.    Dispense:  18 g    Refill:  2   amLODipine (NORVASC) 5 MG tablet    Sig: Take 1 tablet (5 mg total) by mouth daily.    Dispense:  90 tablet    Refill:  2   fluticasone (FLONASE) 50 MCG/ACT nasal spray    Sig: Place 2 sprays into both nostrils daily.    Dispense:  16 g    Refill:  2   montelukast (SINGULAIR) 10 MG tablet    Sig: Take 1 tablet (10 mg total) by mouth at bedtime.    Dispense:  90 tablet    Refill:  2   ibuprofen (ADVIL) 800 MG tablet    Sig: Take 1 tablet (800 mg total) by mouth every 8 (eight) hours as needed.    Dispense:  30 tablet    Refill:  0     Caroline More, DO, PGY-3 02/19/2019 10:15 AM

## 2019-02-19 ENCOUNTER — Other Ambulatory Visit (HOSPITAL_COMMUNITY)
Admission: RE | Admit: 2019-02-19 | Discharge: 2019-02-19 | Disposition: A | Discharge status: Home / Self Care | Payer: 59 | Source: Ambulatory Visit | Attending: Family Medicine | Admitting: Family Medicine

## 2019-02-19 ENCOUNTER — Other Ambulatory Visit: Payer: Self-pay

## 2019-02-19 ENCOUNTER — Telehealth: Payer: Self-pay | Admitting: *Deleted

## 2019-02-19 ENCOUNTER — Ambulatory Visit (INDEPENDENT_AMBULATORY_CARE_PROVIDER_SITE_OTHER): Payer: 59 | Admitting: Family Medicine

## 2019-02-19 ENCOUNTER — Encounter: Payer: Self-pay | Admitting: Family Medicine

## 2019-02-19 VITALS — BP 135/80 | HR 97 | Wt 206.8 lb

## 2019-02-19 DIAGNOSIS — Z01419 Encounter for gynecological examination (general) (routine) without abnormal findings: Secondary | ICD-10-CM | POA: Insufficient documentation

## 2019-02-19 DIAGNOSIS — I10 Essential (primary) hypertension: Secondary | ICD-10-CM | POA: Diagnosis not present

## 2019-02-19 DIAGNOSIS — M542 Cervicalgia: Secondary | ICD-10-CM | POA: Diagnosis not present

## 2019-02-19 DIAGNOSIS — N3941 Urge incontinence: Secondary | ICD-10-CM | POA: Diagnosis not present

## 2019-02-19 DIAGNOSIS — R05 Cough: Secondary | ICD-10-CM

## 2019-02-19 DIAGNOSIS — B977 Papillomavirus as the cause of diseases classified elsewhere: Secondary | ICD-10-CM

## 2019-02-19 DIAGNOSIS — Z0000 Encounter for general adult medical examination without abnormal findings: Secondary | ICD-10-CM | POA: Diagnosis not present

## 2019-02-19 DIAGNOSIS — R059 Cough, unspecified: Secondary | ICD-10-CM

## 2019-02-19 DIAGNOSIS — R32 Unspecified urinary incontinence: Secondary | ICD-10-CM | POA: Insufficient documentation

## 2019-02-19 LAB — POCT GLYCOSYLATED HEMOGLOBIN (HGB A1C): Hemoglobin A1C: 5.2 % (ref 4.0–5.6)

## 2019-02-19 MED ORDER — FLUTICASONE PROPIONATE 50 MCG/ACT NA SUSP: 2.0000 | Freq: Every day | NASAL | 2 refills | Status: DC

## 2019-02-19 MED ORDER — IBUPROFEN 800 MG PO TABS: 800.0000 mg | ORAL_TABLET | Freq: Three times a day (TID) | ORAL | 0 refills | Status: DC | PRN

## 2019-02-19 MED ORDER — AMLODIPINE BESYLATE 5 MG PO TABS: 5.0000 mg | ORAL_TABLET | Freq: Every day | ORAL | 2 refills | Status: DC

## 2019-02-19 MED ORDER — ALBUTEROL SULFATE HFA 108 (90 BASE) MCG/ACT IN AERS: 2.0000 | INHALATION_SPRAY | RESPIRATORY_TRACT | 2 refills | Status: DC | PRN

## 2019-02-19 MED ORDER — MONTELUKAST SODIUM 10 MG PO TABS: 10.0000 mg | ORAL_TABLET | Freq: Every day | ORAL | 2 refills | Status: DC

## 2019-02-19 MED FILL — ALBUTEROL SULFATE HFA 108 (: 108 (90 BAS | 17 days supply | Qty: 18 | Fill #0

## 2019-02-19 MED FILL — MONTELUKAST SOD 10 MG TAB: 10 | 90 days supply | Qty: 90 | Fill #0

## 2019-02-19 MED FILL — IBUPROFEN 800 MG TABS: 800 | 10 days supply | Qty: 30 | Fill #0

## 2019-02-19 MED FILL — FLUTICASONE PROP 50 MCG SPR: 50 | 30 days supply | Qty: 16 | Fill #0

## 2019-02-19 MED FILL — AMLODIPINE BESYLATE 5 MG TA: 5 | 90 days supply | Qty: 90 | Fill #0

## 2019-02-19 NOTE — Telephone Encounter (Signed)
-----   Message from Caroline More, DO sent at 02/19/2019 10:16 AM EDT ----- Please inform patient A1C is wnl

## 2019-02-19 NOTE — Assessment & Plan Note (Signed)
Patient with what seems like urge incontinence.  Advised for Keagle exercises.  Instructions given and after visit summary as well.  Patient to do daily Keagle exercises daily, advised to perform at every stoplight or stop sign.  Follow-up in 1 month.

## 2019-02-19 NOTE — Telephone Encounter (Signed)
LMOVM informing pt. Lacey Barron, CMA  

## 2019-02-19 NOTE — Assessment & Plan Note (Signed)
Chronic.  Refilled ibuprofen

## 2019-02-19 NOTE — Patient Instructions (Signed)

## 2019-02-19 NOTE — Assessment & Plan Note (Signed)
Repeat Pap smear performed today.

## 2019-02-19 NOTE — Assessment & Plan Note (Signed)
CMP, Lipid panel, and A1C obtained

## 2019-02-20 LAB — LIPID PANEL
Chol/HDL Ratio: 4 ratio (ref 0.0–4.4)
Cholesterol, Total: 151 mg/dL (ref 100–199)
HDL: 38 mg/dL — ABNORMAL LOW (ref >39)
LDL Calculated: 95 mg/dL (ref 0–99)
Triglycerides: 88 mg/dL (ref 0–149)
VLDL Cholesterol Cal: 18 mg/dL (ref 5–40)

## 2019-02-20 LAB — COMPREHENSIVE METABOLIC PANEL
ALT: 15 IU/L (ref 0–32)
AST: 13 IU/L (ref 0–40)
Albumin/Globulin Ratio: 1.8 (ref 1.2–2.2)
Albumin: 4.7 g/dL (ref 3.8–4.8)
Alkaline Phosphatase: 49 IU/L (ref 39–117)
BUN/Creatinine Ratio: 18 (ref 12–28)
BUN: 17 mg/dL (ref 8–27)
Bilirubin Total: 0.7 mg/dL (ref 0.0–1.2)
CO2: 27 mmol/L (ref 20–29)
Calcium: 10 mg/dL (ref 8.7–10.3)
Chloride: 100 mmol/L (ref 96–106)
Creatinine, Ser: 0.94 mg/dL (ref 0.57–1.00)
GFR calc Af Amer: 75 mL/min/{1.73_m2} (ref >59)
GFR calc non Af Amer: 65 mL/min/{1.73_m2} (ref >59)
Globulin, Total: 2.6 g/dL (ref 1.5–4.5)
Glucose: 118 mg/dL — ABNORMAL HIGH (ref 65–99)
Potassium: 3.9 mmol/L (ref 3.5–5.2)
Sodium: 141 mmol/L (ref 134–144)
Total Protein: 7.3 g/dL (ref 6.0–8.5)

## 2019-02-23 ENCOUNTER — Telehealth: Payer: Self-pay | Admitting: *Deleted

## 2019-02-23 NOTE — Telephone Encounter (Signed)
-----   Message from Lacey More, DO sent at 02/21/2019  7:11 PM EDT ----- Please inform patient that results of CMP and lipid panel are normal.

## 2019-02-23 NOTE — Telephone Encounter (Signed)
Pt informed that labs were normal. Delray Alt, CMA

## 2019-02-24 ENCOUNTER — Telehealth: Payer: Self-pay

## 2019-02-24 LAB — CYTOLOGY - PAP
Diagnosis: NEGATIVE
HPV: NOT DETECTED

## 2019-02-24 NOTE — Telephone Encounter (Signed)
Informed patient of negative results per Dr. Tammi Klippel.  .Lacey Barron, Lacey Barron

## 2019-03-04 NOTE — Telephone Encounter (Signed)
Call to patient. Left message to cal back.

## 2019-03-18 ENCOUNTER — Other Ambulatory Visit: Payer: Self-pay

## 2019-03-18 DIAGNOSIS — I10 Essential (primary) hypertension: Secondary | ICD-10-CM

## 2019-03-18 MED ORDER — LISINOPRIL-HYDROCHLOROTHIAZIDE 20-25 MG PO TABS: 1.0000 | ORAL_TABLET | Freq: Every day | ORAL | 2 refills | Status: DC

## 2019-03-18 MED FILL — LISINOPRIL-HCTZ 20-25 MG TA: 20-25 | 90 days supply | Qty: 90 | Fill #0

## 2019-03-23 NOTE — Telephone Encounter (Signed)
Follow-up call to patient. Left message to call back. ( Voice mail has name confirmation.)

## 2019-04-02 NOTE — Telephone Encounter (Signed)
Call to patient. Left message to call back. Voice mail confirms first and last name and Per ROI, can leave message.  Left message calling to follow-up on procedure or scheduling office visit for follow-up.

## 2019-04-03 ENCOUNTER — Telehealth: Payer: Self-pay | Admitting: Obstetrics & Gynecology

## 2019-04-03 NOTE — Telephone Encounter (Signed)
Return call from patient. States she has not had any bleeding and continues to decline surgery due to Covid testing requirements. Advised Dr Sabra Heck recommends follow-up assessment with ultrasound here in office. Patient agreeable. Appointment scheduled for 04-09-19 at 4pm.

## 2019-04-03 NOTE — Telephone Encounter (Signed)
Call placed to convey benefits for pus.

## 2019-04-06 NOTE — Telephone Encounter (Signed)
Second call placed to review benefit for scheduled ultrasound appointment. Left voicemail message requesting a return call.

## 2019-04-07 ENCOUNTER — Other Ambulatory Visit: Payer: Self-pay

## 2019-04-09 ENCOUNTER — Other Ambulatory Visit: Payer: Self-pay

## 2019-04-09 ENCOUNTER — Ambulatory Visit: Payer: 59 | Admitting: Obstetrics & Gynecology

## 2019-04-09 ENCOUNTER — Ambulatory Visit (INDEPENDENT_AMBULATORY_CARE_PROVIDER_SITE_OTHER): Payer: 59

## 2019-04-09 VITALS — BP 122/66 | HR 88 | Temp 97.2°F | Ht 64.5 in | Wt 210.0 lb

## 2019-04-09 DIAGNOSIS — D251 Intramural leiomyoma of uterus: Secondary | ICD-10-CM

## 2019-04-09 DIAGNOSIS — R9389 Abnormal findings on diagnostic imaging of other specified body structures: Secondary | ICD-10-CM | POA: Diagnosis not present

## 2019-04-09 DIAGNOSIS — N950 Postmenopausal bleeding: Secondary | ICD-10-CM

## 2019-04-09 DIAGNOSIS — N858 Other specified noninflammatory disorders of uterus: Secondary | ICD-10-CM | POA: Diagnosis not present

## 2019-04-09 NOTE — Patient Instructions (Signed)

## 2019-04-09 NOTE — Progress Notes (Signed)
63 y.o. [id number] Married Lacey Barron female here for pelvic ultrasound due to reevaluated the endometrium.  Pt was seen in early march due to PMP bleeding.  PUS showed 8.98mm endometrium with fibroids.  Endometrial biopsy was attempted but sent to 5cm and was very uncomfortable for pt.  I am not completely sure this was an adequate biopsy.  Due to bleeding and thickened endometrium, hysteroscopy was recommended.  This was scheduled and then cancelled due to [place].  Pt has declined having procedure scheduled due to requirment of pre-operative Covid testing.  "I don't want anything stuck up my sinuses."  Denies recent bleeding.  Here for repeat PUS today.  Patient's last menstrual period was 10/14/2013.  Contraception: PMP  Findings:  UTERUS: 8.4 x 5.6 x 8.2cm with at least five fibroids about 2cm or less EMS: 2.5 - 6.26mm, thickened area noted towards fundal region ADNEXA: Left ovary: 3.0 x 1.5 x 1.0cm       Right ovary: 2.5 x 1.5 x 1.6cm CUL DE SAC: no free fluid  Discussion:  Finidngs reviewed.  Hysteroscopy again recommended.  Again, she declines.  Recommended repeating endometrial biopsy as last one was only 5cm deep in reviewing note from provider.  Pt agrees to proceed.  Procedure:  Verbal consent obtained.  Speculum placed.  Cervix visualized and cleansed with betadine prep.  A single toothed tenaculum was applied to the anterior lip of the cervix.  Endometrial pipelle was advanced through the cervix into the endometrial cavity without difficulty.  Pipelle passed to 8cm.  Suction applied and pipelle removed with good tissue sample obtained.  Tenculum removed.  No bleeding noted.  Patient tolerated procedure well.  Assessment:  PMP with thickened endometrium and h/o PMP bleeding in February Uterine fibroids Declines hysteroscopy with possible polyp resection, D&C due to pre-surgical covid testing that will be required   Plan:  Endometrial biopsy obtained today.  Pathology will  be called to pt and follow-up planned at that point.  ~15 minutes spent with patient >50% of time was in face to face discussion of above.

## 2019-04-10 ENCOUNTER — Encounter: Payer: Self-pay | Admitting: Obstetrics & Gynecology

## 2019-04-10 DIAGNOSIS — R9389 Abnormal findings on diagnostic imaging of other specified body structures: Secondary | ICD-10-CM | POA: Insufficient documentation

## 2019-04-20 ENCOUNTER — Telehealth: Payer: Self-pay | Admitting: *Deleted

## 2019-04-20 DIAGNOSIS — D251 Intramural leiomyoma of uterus: Secondary | ICD-10-CM

## 2019-04-20 DIAGNOSIS — R9389 Abnormal findings on diagnostic imaging of other specified body structures: Secondary | ICD-10-CM

## 2019-04-20 NOTE — Telephone Encounter (Signed)
Call to patient. Left message to call back.  

## 2019-04-20 NOTE — Telephone Encounter (Signed)
-----   Message from Megan Salon, MD sent at 04/17/2019  6:05 PM EDT ----- Please let pt know her endometrial biopsy pathology was negative for abnormal cells.  I still think there is a polyp that is present and should be removed at some point.  She is not going to have that done until covid testing is not required.  Needs follow-up PUS in one year to reevaluate.  Ok to just place order for one year or to go ahead and schedule.  Thanks.  CC:  Lowell Bouton, CMA

## 2019-04-23 NOTE — Telephone Encounter (Signed)
Call to patient. Left message to call back.  

## 2019-04-29 NOTE — Telephone Encounter (Signed)
Call to patient. Reviewed endometrial biopsy results and recommendation for removal at some point. Patient declines surgery at this time related to Covid 19. Agrees to repeat ultrasound scheduled for 04-07-20 at 330pm.   Advised to call back if any bleeding or change in symptoms. Patient agreeable.  Encounter closed.

## 2019-06-22 MED FILL — MONTELUKAST SOD 10 MG TAB: 10 | 90 days supply | Qty: 90 | Fill #1

## 2019-06-22 MED FILL — LISINOPRIL-HCTZ 20-25 MG TA: 20-25 | 90 days supply | Qty: 90 | Fill #1

## 2019-06-22 MED FILL — AMLODIPINE BESYLATE 5 MG TA: 5 | 90 days supply | Qty: 90 | Fill #1

## 2019-06-25 MED FILL — FLUTICASONE PROP 50 MCG SPR: 50 | 30 days supply | Qty: 16 | Fill #1

## 2019-06-25 MED FILL — ALBUTEROL SULFATE HFA 108 (: 108 (90 BAS | 17 days supply | Qty: 18 | Fill #1

## 2019-10-15 MED FILL — AMLODIPINE BESYLATE 5 MG TA: 5 | 90 days supply | Qty: 90 | Fill #2

## 2019-10-15 MED FILL — LISINOPRIL-HCTZ 20-25 MG TA: 20-25 | 90 days supply | Qty: 90 | Fill #2

## 2019-10-15 MED FILL — ALBUTEROL SULFATE HFA 108 (: 108 (90 BAS | 17 days supply | Qty: 18 | Fill #2

## 2019-10-15 MED FILL — MONTELUKAST SOD 10 MG TAB: 10 | 90 days supply | Qty: 90 | Fill #2

## 2019-10-15 MED FILL — FLUTICASONE PROP 50 MCG SPR: 50 | 30 days supply | Qty: 16 | Fill #2

## 2020-02-24 NOTE — Progress Notes (Signed)
SUBJECTIVE:   Chief compliant/HPI: annual examination  Lacey Barron is a 64 y.o. who presents today for an annual exam.   Acute Concerns: Bilateral Knee pain: Patient complaining of bilateral knee pain [place].  She notes that she was walking down the street when she stepped her foot and fell on both her knees.  She notes that it has been painful since November.  She notes that it aches all day long, has stiffness in the morning which improves within 15 to 20 minutes.  She notes that she has worsening pain/stiffness with prolonged sitting.  Pain is worse at night as well.  She has not been evaluated for this before.  She has been treating with Voltaren gel which improved the pain temporarily and ibuprofen 600 mg at night which helps with the pain.  Denies any catching but her leg does feel like it gives out.  Urinary incontinence: Patient notes that she was evaluated for urinary incontinence at last visit.  She was recommended to do Kegel exercises but she notes this has not improved her symptoms.  She notes that she has the urgent need to go the bathroom and will often have incontinence on her way there.  Denies any incontinence with sneezing, coughing.  PMH: HTN, intermittent claudication, allergic rhinitis, asthma, obesity, iron deficiency anemia, urinary incontinence, h/o positive HPV  Health Maintenance: Due for COVID-19 vaccine, TDAP, colonoscopy, mammogram, and pap-smear Family history of colon cancer. Last colonoscopy 2016 that was normal. Recommend repeat in 5 years.  HTN:  BP: 135/80 today. Currently on Amlodipine 5mg  QD, Lisinopril-HCTZ 20-25mg  QD. Endorses compliance. Non-smoker. Denies any chest pain, SOB, vision changes, or headaches.   Social History: LMP: 10/15/19 Contraception: Postmenopausal Alcohol: No Tobacco: No Illicit Drugs: NO Safe at home: yes Depression/Suicidality: No  PHQ-2 score of 3 for nearly every day of  little interest/pleasure in doing thing but actually denies this and notes just doing want to do things because of the pain in her legs.  History tabs reviewed and updated.   Review of systems form reviewed noted in HPI.   OBJECTIVE:   BP 135/80   Pulse 100   Ht 5\' 6"  (1.676 m)   Wt 214 lb 12.8 oz (97.4 kg)   LMP 10/14/2013   SpO2 96%   BMI 34.67 kg/m   General: Pleasant older lady, sitting comfortably in exam chair, well nourished, well developed, in no acute distress with non-toxic appearance HEENT: normocephalic, atraumatic, moist mucous membranes Neck: supple, normal ROM CV: regular rate and rhythm without murmurs, rubs, or gallops, no lower extremity edema, 2+ pedal and posterior tibial pulses bilaterally Lungs: clear to auscultation bilaterally with normal work of breathing Abdomen: soft, non-tender Skin: warm, dry Extremities: warm and well perfused MSK:  gait normal, see below Neuro: Alert and oriented, speech normal Pelvic exam: VULVA: normal appearing vulva with no masses, tenderness or lesions, VAGINA: normal appearing vagina with normal color and discharge, no lesions, atrophic. No bladder, cervical/utereine, or rectal prolapse appreciated. CERVIX: normal appearing cervix without discharge or lesions, mildly friable with manipulation, UTERUS: uterus is normal size, shape, consistency and nontender, ADNEXA: normal adnexa in size, nontender and no masses, exam chaperoned by Leonia Corona.  Right Knee: - Inspection: no gross deformity. No swelling/effusion, erythema or bruising. Skin intact - Palpation:  Tender to palpation along joint line and along patella bilaterally - ROM: full active ROM with flexion and extension in knee and hip -  Strength: 5/5 strength - Neuro/vasc: NV intact - Special Tests: - LIGAMENTS: negative anterior and posterior drawer, negative Lachman's, no MCL or LCL laxity  -- MENISCUS: negative McMurray's -- PF JOINT: nml patellar mobility  bilaterally  Left Knee: - Inspection: no gross deformity. No swelling/effusion, erythema or bruising. Skin intact - Palpation: Tender to palpation along joint line and along patella bilaterally - ROM: full active ROM with flexion and extension in knee and hip - Strength: 5/5 strength - Neuro/vasc: NV intact - Special Tests: - LIGAMENTS: negative anterior and posterior drawer, negative Lachman's, no MCL or LCL laxity  -- MENISCUS: negative McMurray's  Hips: normal ROM, negative FABER and FADIR bilaterally  ASSESSMENT/PLAN:   Annual Physical Exam: Patient here today for annual exam. PMH, surgical history, and social history were reviewed. The following concerns below were discussed.   Thickened endometrium Patient was seen by gynecology in March 2020 due to postmenopausal bleeding.  Pelvic ultrasound showed 8.2 mm endometrium with fibroids.  They continue to recommend hysteroscopy but she continues to decline due to the requirement of preop Covid testing.  She had a normal repeat endometrial biopsy Repeat pelvic ultrasound scheduled for 04/07/20  HYPERTENSION, BENIGN, MILD BP around goal of <130-140/80. Nonsmoker. The 10-year ASCVD risk score Mikey Bussing DC Jr., et al., 2013) is: 8.3%.  -Continue amlodipine 5 mg QD -Continue lisinopril-HCTZ QD  Allergic rhinitis Continue Zyrtec, Singulair, and Flonase as prescribed.  Refills provided  Asthma As needed albuterol inhaler refilled  Urinary incontinence Symptoms appear most consistent with urge incontinence.  No improvement with bladder training.  No organ prolapse appreciated on pelvic exam. Recommended patient follow-up with PCP for further evaluation.  Consider transition off of HCTZ as this could exacerbate her symptoms.  Educate patient that muscle relaxers can also worsen her symptoms. Recommend limiting caffeine intake. Consider trial of vaginal estrogen.  If no improvement can consider trial of oxybutynin although recommend education  on side effect profile with anticholinergics.  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  No ligament laxity or meniscal findings on exam.  - Will obtain bilateral knee x-rays to rule out other pathologies.   - Recommend conservative management at this time including: meloxicam 15 mg daily, Voltaren gel, heat/ice as needed, activity as tolerated avoiding aggravating movements, quad/hamstring strengthening exercises.   - RTC in 4 to 6 weeks if no improvement, sooner if worsening.  Health Maintenance:  Patient to schedule colonoscopy and mammogram.  Information provided. Patient has had Covid vaccine Pap smear performed today.  Will call with results   Annual Examination  See AVS for age appropriate recommendations.  Blood pressure value is only mildly above goal. Discussed  Considered the following screening exams based upon USPSTF recommendations: Diabetes screening: ordered Screening for elevated cholesterol: ordered HIV testing: discussed and declined Hepatitis C: discussed and declined Hepatitis B: discussed and declined Syphilis if at high risk: no at high risk Reviewed risk factors for latent tuberculosis and not indicated Colorectal cancer screening: discussed, colonoscopy ordered Lung cancer screening: not indicated  Follow up in 1 year or sooner if indicated.    Mina Marble, DO [facility], PGY3 02/25/2020 9:57 AM

## 2020-02-24 NOTE — Assessment & Plan Note (Addendum)
Patient was seen by gynecology in March 2020 due to postmenopausal bleeding.  Pelvic ultrasound showed 8.2 mm endometrium with fibroids.  They continue to recommend hysteroscopy but she continues to decline due to the requirement of preop Covid testing.  She had a normal repeat endometrial biopsy Repeat pelvic ultrasound scheduled for 04/07/20

## 2020-02-25 ENCOUNTER — Ambulatory Visit (INDEPENDENT_AMBULATORY_CARE_PROVIDER_SITE_OTHER): Payer: 59 | Admitting: Family Medicine

## 2020-02-25 ENCOUNTER — Other Ambulatory Visit: Payer: Self-pay

## 2020-02-25 ENCOUNTER — Other Ambulatory Visit: Payer: Self-pay | Admitting: Family Medicine

## 2020-02-25 ENCOUNTER — Other Ambulatory Visit (HOSPITAL_COMMUNITY)
Admission: RE | Admit: 2020-02-25 | Discharge: 2020-02-25 | Disposition: A | Discharge status: Home / Self Care | Payer: 59 | Source: Ambulatory Visit | Attending: Family Medicine | Admitting: Family Medicine

## 2020-02-25 ENCOUNTER — Encounter: Payer: Self-pay | Admitting: Family Medicine

## 2020-02-25 VITALS — BP 135/80 | HR 100 | Ht 66.0 in | Wt 214.8 lb

## 2020-02-25 DIAGNOSIS — J45909 Unspecified asthma, uncomplicated: Secondary | ICD-10-CM

## 2020-02-25 DIAGNOSIS — Z13220 Encounter for screening for lipoid disorders: Secondary | ICD-10-CM | POA: Diagnosis not present

## 2020-02-25 DIAGNOSIS — Z0000 Encounter for general adult medical examination without abnormal findings: Secondary | ICD-10-CM | POA: Diagnosis not present

## 2020-02-25 DIAGNOSIS — N3941 Urge incontinence: Secondary | ICD-10-CM | POA: Diagnosis not present

## 2020-02-25 DIAGNOSIS — Z124 Encounter for screening for malignant neoplasm of cervix: Secondary | ICD-10-CM | POA: Insufficient documentation

## 2020-02-25 DIAGNOSIS — Z1231 Encounter for screening mammogram for malignant neoplasm of breast: Secondary | ICD-10-CM

## 2020-02-25 DIAGNOSIS — Z1211 Encounter for screening for malignant neoplasm of colon: Secondary | ICD-10-CM | POA: Diagnosis not present

## 2020-02-25 DIAGNOSIS — I10 Essential (primary) hypertension: Secondary | ICD-10-CM | POA: Diagnosis not present

## 2020-02-25 DIAGNOSIS — R9389 Abnormal findings on diagnostic imaging of other specified body structures: Secondary | ICD-10-CM

## 2020-02-25 DIAGNOSIS — B977 Papillomavirus as the cause of diseases classified elsewhere: Secondary | ICD-10-CM | POA: Insufficient documentation

## 2020-02-25 DIAGNOSIS — J309 Allergic rhinitis, unspecified: Secondary | ICD-10-CM

## 2020-02-25 DIAGNOSIS — M25562 Pain in left knee: Secondary | ICD-10-CM

## 2020-02-25 DIAGNOSIS — M25561 Pain in right knee: Secondary | ICD-10-CM

## 2020-02-25 DIAGNOSIS — Z131 Encounter for screening for diabetes mellitus: Secondary | ICD-10-CM

## 2020-02-25 DIAGNOSIS — G8929 Other chronic pain: Secondary | ICD-10-CM

## 2020-02-25 LAB — POCT GLYCOSYLATED HEMOGLOBIN (HGB A1C): Hemoglobin A1C: 5.2 % (ref 4.0–5.6)

## 2020-02-25 MED ORDER — MONTELUKAST SODIUM 10 MG PO TABS: 10.0000 mg | ORAL_TABLET | Freq: Every day | ORAL | 2 refills | Status: DC

## 2020-02-25 MED ORDER — MELOXICAM 15 MG PO TABS: 15.0000 mg | ORAL_TABLET | Freq: Every day | ORAL | 0 refills | Status: AC | PRN

## 2020-02-25 MED ORDER — FLUTICASONE PROPIONATE 50 MCG/ACT NA SUSP: 2.0000 | Freq: Every day | NASAL | 2 refills | Status: DC

## 2020-02-25 MED ORDER — AMLODIPINE BESYLATE 5 MG PO TABS: 5.0000 mg | ORAL_TABLET | Freq: Every day | ORAL | 2 refills | Status: DC

## 2020-02-25 MED ORDER — LISINOPRIL-HYDROCHLOROTHIAZIDE 20-25 MG PO TABS: 1.0000 | ORAL_TABLET | Freq: Every day | ORAL | 2 refills | Status: DC

## 2020-02-25 MED ORDER — BACLOFEN 10 MG PO TABS: 10.0000 mg | ORAL_TABLET | Freq: Three times a day (TID) | ORAL | 1 refills | Status: DC | PRN

## 2020-02-25 MED ORDER — ALBUTEROL SULFATE HFA 108 (90 BASE) MCG/ACT IN AERS: 2.0000 | INHALATION_SPRAY | RESPIRATORY_TRACT | 2 refills | Status: DC | PRN

## 2020-02-25 MED FILL — MELOXICAM 15 MG TABLET: 15 | 30 days supply | Qty: 30 | Fill #0

## 2020-02-25 MED FILL — AMLODIPINE BESYLATE 5 MG TA: 5 | 90 days supply | Qty: 90 | Fill #0

## 2020-02-25 MED FILL — LISINOPRIL-HCTZ 20-25 MG TA: 20-25 | 90 days supply | Qty: 90 | Fill #0

## 2020-02-25 MED FILL — MONTELUKAST SOD 10 MG TAB: 10 | 90 days supply | Qty: 90 | Fill #0

## 2020-02-25 MED FILL — ALBUTEROL SULFATE HFA 108 (: 108 (90 BAS | 17 days supply | Qty: 18 | Fill #0

## 2020-02-25 MED FILL — FLUTICASONE PROP 50 MCG SPR: 50 | 30 days supply | Qty: 16 | Fill #0

## 2020-02-25 MED FILL — BACLOFEN 10 MG TABS: 10 | 10 days supply | Qty: 30 | Fill #0

## 2020-02-25 NOTE — Assessment & Plan Note (Signed)
Continue Zyrtec, Singulair, and Flonase as prescribed.  Refills provided

## 2020-02-25 NOTE — Assessment & Plan Note (Addendum)
BP around goal of <130-140/80. Nonsmoker. The 10-year ASCVD risk score Mikey Bussing DC Jr., et al., 2013) is: 8.3%.  -Continue amlodipine 5 mg QD -Continue lisinopril-HCTZ QD

## 2020-02-25 NOTE — Assessment & Plan Note (Addendum)
Symptoms and physical exam appear most consistent with osteoarthritis with possible patellofemoral syndrome contributing.  No ligament laxity or meniscal findings on exam.  - Will obtain bilateral knee x-rays to rule out other pathologies.   - Recommend conservative management at this time including: meloxicam 15 mg daily, Voltaren gel, heat/ice as needed, activity as tolerated avoiding aggravating movements, quad/hamstring strengthening exercises.   - RTC in 4 to 6 weeks if no improvement, sooner if worsening.

## 2020-02-25 NOTE — Assessment & Plan Note (Signed)
As needed albuterol inhaler refilled

## 2020-02-25 NOTE — Progress Notes (Signed)
Patient given PHQ2 and 9.   Provider aware.  .[name] R [name], CMA  

## 2020-02-25 NOTE — Patient Instructions (Signed)
Thank you for coming to see me today. It was a pleasure to see you.   I believe your knee pain may be from arthritis and patellofemoral syndrome.  I have ordered knee x-rays to further evaluate for any other possible causes.  I recommend performing the quad strengthening exercises recommended and handout 2-3 times per day.  Avoid painful activities when possible (often deep squats, lunges bother this). Cross train with swimming, cycling with low resistance, elliptical if needed. Straight leg raise, hip side raises, straight leg raises with foot turned outwards 3 sets of 10 once a day. Add ankle weight if these become too easy. Consider formal physical therapy. Correct foot breakdown with something like dr. Zoe Lan active series, spencos, or our green sports insoles. Avoid flat shoes, barefoot walking as much as possible. Icing 15 minutes at a time 3-4 times a day as needed. Meloxicam daily for pain Follow up with your PCP in 4-6 weeks if no improvement.   Please schedule your mammogram and colonoscopy at your earliest convenience.  Today we obtained your Pap smear.  I will call you with results  Please schedule a follow-up appointment with your PCP to further evaluate your urinary incontinence and discuss possible treatment options.  We are checking some labs today, I will call you if they are abnormal will send you a MyChart message or a letter if they are normal.  If you do not hear about your labs in the next 2 weeks please let us know.  If you have any questions or concerns, please do not hesitate to call the office at [phone].  Take Care,  Dr. Mina Marble, DO Resident Physician [place] [phone]

## 2020-02-25 NOTE — Assessment & Plan Note (Signed)
Symptoms appear most consistent with urge incontinence.  No improvement with bladder training.  No organ prolapse appreciated on pelvic exam. Recommended patient follow-up with PCP for further evaluation.  Consider transition off of HCTZ as this could exacerbate her symptoms.  Educate patient that muscle relaxers can also worsen her symptoms. Recommend limiting caffeine intake. Consider trial of vaginal estrogen.  If no improvement can consider trial of oxybutynin although recommend education on side effect profile with anticholinergics.

## 2020-02-26 LAB — COMPREHENSIVE METABOLIC PANEL
ALT: 18 IU/L (ref 0–32)
AST: 16 IU/L (ref 0–40)
Albumin/Globulin Ratio: 1.9 (ref 1.2–2.2)
Albumin: 4.6 g/dL (ref 3.8–4.8)
Alkaline Phosphatase: 55 IU/L (ref 48–121)
BUN/Creatinine Ratio: 23 (ref 12–28)
BUN: 20 mg/dL (ref 8–27)
Bilirubin Total: 0.7 mg/dL (ref 0.0–1.2)
CO2: 25 mmol/L (ref 20–29)
Calcium: 10 mg/dL (ref 8.7–10.3)
Chloride: 102 mmol/L (ref 96–106)
Creatinine, Ser: 0.86 mg/dL (ref 0.57–1.00)
GFR calc Af Amer: 83 mL/min/{1.73_m2} (ref >59)
GFR calc non Af Amer: 72 mL/min/{1.73_m2} (ref >59)
Globulin, Total: 2.4 g/dL (ref 1.5–4.5)
Glucose: 122 mg/dL — ABNORMAL HIGH (ref 65–99)
Potassium: 3.9 mmol/L (ref 3.5–5.2)
Sodium: 140 mmol/L (ref 134–144)
Total Protein: 7 g/dL (ref 6.0–8.5)

## 2020-02-26 LAB — LIPID PANEL
Chol/HDL Ratio: 4.3 ratio (ref 0.0–4.4)
Cholesterol, Total: 149 mg/dL (ref 100–199)
HDL: 35 mg/dL — ABNORMAL LOW (ref >39)
LDL Chol Calc (NIH): 95 mg/dL (ref 0–99)
Triglycerides: 100 mg/dL (ref 0–149)
VLDL Cholesterol Cal: 19 mg/dL (ref 5–40)

## 2020-02-26 LAB — CYTOLOGY - PAP
Comment: NEGATIVE
Diagnosis: NEGATIVE
High risk HPV: NEGATIVE

## 2020-03-02 ENCOUNTER — Encounter: Payer: Self-pay | Admitting: Gastroenterology

## 2020-04-04 ENCOUNTER — Other Ambulatory Visit: Payer: Self-pay

## 2020-04-04 ENCOUNTER — Ambulatory Visit
Admission: RE | Admit: 2020-04-04 | Discharge: 2020-04-04 | Disposition: A | Discharge status: Home / Self Care | Payer: 59 | Source: Ambulatory Visit | Attending: Family Medicine | Admitting: Family Medicine

## 2020-04-04 DIAGNOSIS — Z1231 Encounter for screening mammogram for malignant neoplasm of breast: Secondary | ICD-10-CM

## 2020-04-07 ENCOUNTER — Other Ambulatory Visit: Payer: 59 | Admitting: Obstetrics & Gynecology

## 2020-04-07 ENCOUNTER — Other Ambulatory Visit: Payer: 59

## 2020-04-14 ENCOUNTER — Encounter: Payer: Self-pay | Admitting: Obstetrics & Gynecology

## 2020-04-14 ENCOUNTER — Other Ambulatory Visit: Payer: Self-pay

## 2020-04-14 ENCOUNTER — Ambulatory Visit (INDEPENDENT_AMBULATORY_CARE_PROVIDER_SITE_OTHER): Payer: 59 | Admitting: Obstetrics & Gynecology

## 2020-04-14 ENCOUNTER — Ambulatory Visit (INDEPENDENT_AMBULATORY_CARE_PROVIDER_SITE_OTHER): Payer: 59

## 2020-04-14 VITALS — BP 116/78 | HR 68 | Resp 16 | Wt 216.0 lb

## 2020-04-14 DIAGNOSIS — R9389 Abnormal findings on diagnostic imaging of other specified body structures: Secondary | ICD-10-CM | POA: Diagnosis not present

## 2020-04-14 DIAGNOSIS — D251 Intramural leiomyoma of uterus: Secondary | ICD-10-CM

## 2020-04-14 DIAGNOSIS — I10 Essential (primary) hypertension: Secondary | ICD-10-CM

## 2020-04-14 DIAGNOSIS — N9489 Other specified conditions associated with female genital organs and menstrual cycle: Secondary | ICD-10-CM | POA: Diagnosis not present

## 2020-04-14 DIAGNOSIS — J4520 Mild intermittent asthma, uncomplicated: Secondary | ICD-10-CM

## 2020-04-14 NOTE — Progress Notes (Signed)
64 y.o. [id number] Married Lacey Barron female here for pelvic ultrasound to recheck thickened endometrium and probable endometrial polyp.  This was identified last year and hysteroscopy with polyp resection was recommended.  However, pt declined to proceed due pre-op Covid protocols.  She denies vaginal bleeding but has returned for PUS to assess endometrium again.  H/o +HR HPV with pap 2019 but had negative pap with negative HR HPV testing in 2020 and 2021.  Does not need Pap smear obtained today.  Patient's last menstrual period was 10/14/2013.  Contraception: PMP  Findings:  UTERUS: 9.1 x 6.8 x 5.5cm with at least five fibroids, largest measuring 2.4cm EMS: 2mm, echogenic focus with feeder vessel noted, measuring 12 x 84mm ADNEXA: Left ovary: 2.9 x 1.1 x 2.0cm with 4.4 x 1.2cm hydrosalpinx, stable       Right ovary: 2.7 x 1.2 x 1.9cm CUL DE SAC: no free fluid  Discussion:  Ultrasonographer supervised.  Images reviewed with pt.  Endometrium has increased in size since prior ultrasound and echogenic focus is larger as well.  Again, recommend hysteroscopy with resection of endometrial mass as well as D&C.  Pt is ready to proceed this year and feels more comfortable having procedure done this year compared to last year.  Procedure discussed with patient.  Recovery and pain management discussed.  Risks discussed including but not limited to bleeding, rare risk of transfusion, infection, 1% risk of uterine perforation with risks of fluid deficit causing cardiac arrythmia, cerebral swelling and/or need to stop procedure early.  Fluid emboli and rare risk of death discussed.  DVT/PE, rare risk of risk of bowel/bladder/ureteral/vascular injury.  Patient aware if pathology abnormal she may need additional treatment.  All questions answered.  Pre op covid testing questions addressed as well.  Assessment:  Thickened endometrium H/o uterine fibroids, stable 4.4cm hydrosalpinx, stable  Plan:   Health hx reviewed and updated with pt today Will proceed with scheduling hysteroscopy with polyp resection, D&C at this time. Pt did have Covid vaccination in May/June.  Does not need booster at this time.

## 2020-04-17 ENCOUNTER — Encounter: Payer: Self-pay | Admitting: Obstetrics & Gynecology

## 2020-04-22 ENCOUNTER — Other Ambulatory Visit: Payer: Self-pay

## 2020-04-22 ENCOUNTER — Ambulatory Visit (AMBULATORY_SURGERY_CENTER): Payer: Self-pay | Admitting: *Deleted

## 2020-04-22 VITALS — Ht 66.0 in | Wt 214.0 lb

## 2020-04-22 DIAGNOSIS — Z800 Family history of malignant neoplasm of digestive organs: Secondary | ICD-10-CM

## 2020-04-22 MED ORDER — NA SULFATE-K SULFATE-MG SULF 17.5-3.13-1.6 GM/177ML PO SOLN: 1.0000 | Freq: Once | ORAL | 0 refills | Status: AC

## 2020-04-22 MED FILL — SUPREP BOWEL PREP KIT: 17.5-3.13-1 | 1 days supply | Qty: 354 | Fill #0

## 2020-04-22 NOTE — Progress Notes (Signed)

## 2020-04-26 ENCOUNTER — Telehealth: Payer: Self-pay | Admitting: Obstetrics & Gynecology

## 2020-04-26 NOTE — Telephone Encounter (Signed)
Spoke with patient. Advised unable to due surgery on 05/09/2020 as this is a day we are operating at a different hospital. Advised we could do 05/10/2020. Patient declines and states that she does not wish to proceed with scheduling. States that she will call back when she is ready.

## 2020-04-26 NOTE — Telephone Encounter (Signed)
Spoke with patient regarding surgery benefits. Patient acknowledges understanding of information presented. Patient is aware that benefits presented are for professional benefits only. Patient is aware that once surgery is scheduled, the hospital will call with separate benefits. Patient is aware of surgery cancellation policy.  Patient informed me that she was scheduled for a colonoscopy on 05/06/2020 and was scheduled for this surgery on 05/09/2020. Informed patient that she was not scheduled at this time, and that day was unavailable for surgery. Patient also stated that she did not need a COVID test prior to surgery due to being fully vaccinated. Informed patient that due to the rise in COVID numbers, the hospital is requiring COVID test prior to surgery. Patient disagreed.   Advised patient that I would transfer call to nursing supervisor, Verline Lema.

## 2020-04-27 ENCOUNTER — Encounter: Payer: Self-pay | Admitting: Gastroenterology

## 2020-05-03 NOTE — Telephone Encounter (Signed)
Patient left message returning call.

## 2020-05-03 NOTE — Telephone Encounter (Signed)
Spoke with patient. Patient would like to proceed with surgery on 05/09/2020. Surgery scheduled for 05/09/2020 at 0930 am at [facility]. COVID test scheduled for 05/06/2020 at 2 pm at [facility] location. Patient is aware of the need to quarantine after test until surgery. This test is going to be performed after colonoscopy and patient is to quarantine until surgery. 2 week post op scheduled for 05/26/2020 at 8:30 am with Dr.Miller. Patient is agreeable to date and time. Surgery instructions reviewed with patient. Patient verbalizes understanding.   Routing to provider and will close encounter.

## 2020-05-03 NOTE — Telephone Encounter (Signed)
Left message to call [place] at [phone].  Need to speak with patient about options reviewed with Dr.Miller with potentially proceeding with surgery on 05/09/2020. Spoke with the surgery center and since she is having a colonoscopy after her covid test then she is not truly quarantining after her test.  Depending on the anesthesiologist that day, we run the risk of having surgery cancelled. Paitent will need a second COVID test before surgery. She is able to have a rapid test on 05/08/2020.

## 2020-05-03 NOTE — Telephone Encounter (Signed)
Left message to call Lacey Barron at [phone]. 

## 2020-05-04 ENCOUNTER — Other Ambulatory Visit: Payer: Self-pay | Admitting: Obstetrics & Gynecology

## 2020-05-04 ENCOUNTER — Encounter: Payer: Self-pay | Admitting: Obstetrics & Gynecology

## 2020-05-04 ENCOUNTER — Encounter (HOSPITAL_BASED_OUTPATIENT_CLINIC_OR_DEPARTMENT_OTHER): Payer: Self-pay | Admitting: Obstetrics & Gynecology

## 2020-05-05 ENCOUNTER — Other Ambulatory Visit: Payer: Self-pay

## 2020-05-05 ENCOUNTER — Encounter (HOSPITAL_BASED_OUTPATIENT_CLINIC_OR_DEPARTMENT_OTHER): Payer: Self-pay | Admitting: Obstetrics & Gynecology

## 2020-05-05 NOTE — Progress Notes (Signed)
Spoke w/ via phone for pre-op interview--- PT Lab needs dos---- CBC, BMP, EKG              Lab results------ no COVID test ------ 05-06-2020 @ 1400 Arrive at ------- 0730 NPO after MN NO Solid Food.  Clear liquids from MN until--- 0630 Medications to take morning of surgery ----- Norvasc, Zyrtec, Flonase spray Diabetic medication ----- n/a Patient Special Instructions ----- asked to bring rescue inhaler Pre-Op special Istructions ----- n/a Patient verbalized understanding of instructions that were given at this phone interview. Patient denies shortness of breath, chest pain, fever, cough at this phone interview.

## 2020-05-06 ENCOUNTER — Other Ambulatory Visit (HOSPITAL_COMMUNITY)
Admission: RE | Admit: 2020-05-06 | Discharge: 2020-05-06 | Disposition: A | Discharge status: Home / Self Care | Payer: 59 | Source: Ambulatory Visit | Attending: Obstetrics & Gynecology | Admitting: Obstetrics & Gynecology

## 2020-05-06 ENCOUNTER — Encounter: Payer: Self-pay | Admitting: Gastroenterology

## 2020-05-06 ENCOUNTER — Ambulatory Visit (AMBULATORY_SURGERY_CENTER): Payer: 59 | Admitting: Gastroenterology

## 2020-05-06 VITALS — BP 121/80 | HR 80 | Temp 97.7°F | Resp 21 | Ht 66.0 in | Wt 214.0 lb

## 2020-05-06 DIAGNOSIS — K635 Polyp of colon: Secondary | ICD-10-CM

## 2020-05-06 DIAGNOSIS — D123 Benign neoplasm of transverse colon: Secondary | ICD-10-CM

## 2020-05-06 DIAGNOSIS — Z86010 Personal history of colonic polyps: Secondary | ICD-10-CM | POA: Diagnosis not present

## 2020-05-06 DIAGNOSIS — Z800 Family history of malignant neoplasm of digestive organs: Secondary | ICD-10-CM

## 2020-05-06 DIAGNOSIS — Z1211 Encounter for screening for malignant neoplasm of colon: Secondary | ICD-10-CM | POA: Diagnosis not present

## 2020-05-06 DIAGNOSIS — Z01812 Encounter for preprocedural laboratory examination: Secondary | ICD-10-CM | POA: Diagnosis not present

## 2020-05-06 DIAGNOSIS — K219 Gastro-esophageal reflux disease without esophagitis: Secondary | ICD-10-CM | POA: Diagnosis not present

## 2020-05-06 DIAGNOSIS — Z20822 Contact with and (suspected) exposure to covid-19: Secondary | ICD-10-CM | POA: Diagnosis not present

## 2020-05-06 DIAGNOSIS — I10 Essential (primary) hypertension: Secondary | ICD-10-CM | POA: Diagnosis not present

## 2020-05-06 DIAGNOSIS — D124 Benign neoplasm of descending colon: Secondary | ICD-10-CM

## 2020-05-06 LAB — SARS CORONAVIRUS 2 (TAT 6-24 HRS): SARS Coronavirus 2: NEGATIVE

## 2020-05-06 MED ORDER — SODIUM CHLORIDE 0.9 % IV SOLN: 500.0000 mL | Freq: Once | INTRAVENOUS | Status: DC

## 2020-05-06 NOTE — Patient Instructions (Signed)
Handouts provided on polyps and hemorrhoids.   YOU HAD AN ENDOSCOPIC PROCEDURE TODAY AT THE [facility]:   Refer to the procedure report that was given to you for any specific questions about what was found during the examination.  If the procedure report does not answer your questions, please call your gastroenterologist to clarify.  If you requested that your care partner not be given the details of your procedure findings, then the procedure report has been included in a sealed envelope for you to review at your convenience later.  YOU SHOULD EXPECT: Some feelings of bloating in the abdomen. Passage of more gas than usual.  Walking can help get rid of the air that was put into your GI tract during the procedure and reduce the bloating. If you had a lower endoscopy (such as a colonoscopy or flexible sigmoidoscopy) you may notice spotting of blood in your stool or on the toilet paper. If you underwent a bowel prep for your procedure, you may not have a normal bowel movement for a few days.  Please Note:  You might notice some irritation and congestion in your nose or some drainage.  This is from the oxygen used during your procedure.  There is no need for concern and it should clear up in a day or so.  SYMPTOMS TO REPORT IMMEDIATELY:  Following lower endoscopy (colonoscopy or flexible sigmoidoscopy):  Excessive amounts of blood in the stool  Significant tenderness or worsening of abdominal pains  Swelling of the abdomen that is new, acute  Fever of 100F or higher  For urgent or emergent issues, a gastroenterologist can be reached at any hour by calling [phone]. Do not use MyChart messaging for urgent concerns.    DIET:  We do recommend a small meal at first, but then you may proceed to your regular diet.  Drink plenty of fluids but you should avoid alcoholic beverages for 24 hours.  ACTIVITY:  You should plan to take it easy for the rest of today and you should NOT DRIVE  or use heavy machinery until tomorrow (because of the sedation medicines used during the test).    FOLLOW UP: Our staff will call the number listed on your records 48-72 hours following your procedure to check on you and address any questions or concerns that you may have regarding the information given to you following your procedure. If we do not reach you, we will leave a message.  We will attempt to reach you two times.  During this call, we will ask if you have developed any symptoms of COVID 19. If you develop any symptoms (ie: fever, flu-like symptoms, shortness of breath, cough etc.) before then, please call [phone].  If you test positive for Covid 19 in the 2 weeks post procedure, please call and report this information to us.    If any biopsies were taken you will be contacted by phone or by letter within the next 1-3 weeks.  Please call us at [phone] if you have not heard about the biopsies in 3 weeks.    SIGNATURES/CONFIDENTIALITY: You and/or your care partner have signed paperwork which will be entered into your electronic medical record.  These signatures attest to the fact that that the information above on your After Visit Summary has been reviewed and is understood.  Full responsibility of the confidentiality of this discharge information lies with you and/or your care-partner.  

## 2020-05-06 NOTE — Progress Notes (Signed)
Report given to PACU, vss 

## 2020-05-06 NOTE — Op Note (Signed)
[place] Patient Name: Lacey Barron Procedure Date: 05/06/2020 10:43 AM MRN: [id number] Endoscopist: Milus Banister , MD Age: 64 Referring MD:  Date of Birth: 30-Jul-1956 Gender: Female Account #: [id number] Procedure:                Colonoscopy Indications:              Screening in patient at increased risk: Family                            history of 1st-degree relative with colorectal                            cancer (mother diagnosed in her early 52s) Medicines:                Monitored Anesthesia Care Procedure:                Pre-Anesthesia Assessment:                           - Prior to the procedure, a History and Physical                            was performed, and patient medications and                            allergies were reviewed. The patient's tolerance of                            previous anesthesia was also reviewed. The risks                            and benefits of the procedure and the sedation                            options and risks were discussed with the patient.                            All questions were answered, and informed consent                            was obtained. Prior Anticoagulants: The patient has                            taken no previous anticoagulant or antiplatelet                            agents. ASA Grade Assessment: II - A patient with                            mild systemic disease. After reviewing the risks                            and benefits, the patient was deemed in  satisfactory condition to undergo the procedure.                           After obtaining informed consent, the colonoscope                            was passed under direct vision. Throughout the                            procedure, the patient's blood pressure, pulse, and                            oxygen saturations were monitored continuously. The                            Colonoscope was  introduced through the anus and                            advanced to the the cecum, identified by                            appendiceal orifice and ileocecal valve. The                            colonoscopy was performed without difficulty. The                            patient tolerated the procedure well. The quality                            of the bowel preparation was good. The ileocecal                            valve, appendiceal orifice, and rectum were                            photographed. Scope In: 11:02:30 AM Scope Out: 11:14:34 AM Scope Withdrawal Time: 0 hours 8 minutes 47 seconds  Total Procedure Duration: 0 hours 12 minutes 4 seconds  Findings:                 Two polyps were found in the descending colon and                            transverse colon. The polyps were 1 to 2 mm in                            size. One was semipedunculated, the other was                            sessile. These polyps were removed with a cold                            snare. Resection and retrieval were complete.  External hemorrhoids were found. The hemorrhoids                            were small.                           The exam was otherwise without abnormality on                            direct and retroflexion views. Complications:            No immediate complications. Estimated blood loss:                            None. Estimated Blood Loss:     Estimated blood loss: none. Impression:               - Two 1 to 2 mm polyps in the descending colon and                            in the transverse colon, removed with a cold snare.                            Resected and retrieved.                           - External hemorrhoids.                           - The examination was otherwise normal on direct                            and retroflexion views. Recommendation:           - Patient has a contact number available for                             emergencies. The signs and symptoms of potential                            delayed complications were discussed with the                            patient. Return to normal activities tomorrow.                            Written discharge instructions were provided to the                            patient.                           - Resume previous diet.                           - Continue present medications.                           -  Await pathology results. Milus Banister, MD 05/06/2020 11:17:43 AM This report has been signed electronically.

## 2020-05-06 NOTE — Progress Notes (Signed)
Called to room to assist during endoscopic procedure.  Patient ID and intended procedure confirmed with present staff. Received instructions for my participation in the procedure from the performing physician.  

## 2020-05-08 NOTE — Anesthesia Preprocedure Evaluation (Addendum)
Anesthesia Evaluation  Patient identified by MRN, date of birth, ID band Patient awake    Reviewed: Allergy & Precautions, NPO status , Patient's Chart, lab work & pertinent test results  History of Anesthesia Complications (+) PONV and history of anesthetic complications  Airway Mallampati: II  TM Distance: >3 FB Neck ROM: Full    Dental  (+) Partial Lower, Partial Upper, Dental Advisory Given   Pulmonary asthma ,    Pulmonary exam normal        Cardiovascular hypertension, Normal cardiovascular exam     Neuro/Psych negative neurological ROS     GI/Hepatic negative GI ROS, Neg liver ROS,   Endo/Other  negative endocrine ROS  Renal/GU negative Renal ROS     Musculoskeletal negative musculoskeletal ROS (+)   Abdominal   Peds  Hematology negative hematology ROS (+) Sickle cell trait ,   Anesthesia Other Findings   Reproductive/Obstetrics                            Anesthesia Physical Anesthesia Plan  ASA: II  Anesthesia Plan: General   Post-op Pain Management:    Induction: Intravenous  PONV Risk Score and Plan: 4 or greater and Ondansetron, Scopolamine patch - Pre-op, Midazolam and Propofol infusion  Airway Management Planned: LMA  Additional Equipment:   Intra-op Plan:   Post-operative Plan: Extubation in OR  Informed Consent: I have reviewed the patients History and Physical, chart, labs and discussed the procedure including the risks, benefits and alternatives for the proposed anesthesia with the patient or authorized representative who has indicated his/her understanding and acceptance.     Dental advisory given  Plan Discussed with: Anesthesiologist and CRNA  Anesthesia Plan Comments:        Anesthesia Quick Evaluation

## 2020-05-09 ENCOUNTER — Ambulatory Visit (HOSPITAL_BASED_OUTPATIENT_CLINIC_OR_DEPARTMENT_OTHER)
Admission: RE | Admit: 2020-05-09 | Discharge: 2020-05-09 | Disposition: A | Discharge status: Home / Self Care | Payer: 59 | Attending: Obstetrics & Gynecology | Admitting: Obstetrics & Gynecology

## 2020-05-09 ENCOUNTER — Encounter (HOSPITAL_BASED_OUTPATIENT_CLINIC_OR_DEPARTMENT_OTHER)
Admission: RE | Disposition: A | Discharge status: Home / Self Care | Payer: Self-pay | Source: Home / Self Care | Attending: Obstetrics & Gynecology

## 2020-05-09 ENCOUNTER — Ambulatory Visit (HOSPITAL_BASED_OUTPATIENT_CLINIC_OR_DEPARTMENT_OTHER): Payer: 59 | Admitting: Anesthesiology

## 2020-05-09 ENCOUNTER — Encounter (HOSPITAL_BASED_OUTPATIENT_CLINIC_OR_DEPARTMENT_OTHER): Payer: Self-pay | Admitting: Obstetrics & Gynecology

## 2020-05-09 ENCOUNTER — Other Ambulatory Visit: Payer: Self-pay

## 2020-05-09 ENCOUNTER — Other Ambulatory Visit: Payer: Self-pay | Admitting: Obstetrics & Gynecology

## 2020-05-09 DIAGNOSIS — D573 Sickle-cell trait: Secondary | ICD-10-CM | POA: Diagnosis not present

## 2020-05-09 DIAGNOSIS — I10 Essential (primary) hypertension: Secondary | ICD-10-CM | POA: Diagnosis not present

## 2020-05-09 DIAGNOSIS — K219 Gastro-esophageal reflux disease without esophagitis: Secondary | ICD-10-CM | POA: Insufficient documentation

## 2020-05-09 DIAGNOSIS — Z79899 Other long term (current) drug therapy: Secondary | ICD-10-CM | POA: Diagnosis not present

## 2020-05-09 DIAGNOSIS — D509 Iron deficiency anemia, unspecified: Secondary | ICD-10-CM | POA: Diagnosis not present

## 2020-05-09 DIAGNOSIS — Z791 Long term (current) use of non-steroidal anti-inflammatories (NSAID): Secondary | ICD-10-CM | POA: Diagnosis not present

## 2020-05-09 DIAGNOSIS — D259 Leiomyoma of uterus, unspecified: Secondary | ICD-10-CM | POA: Insufficient documentation

## 2020-05-09 DIAGNOSIS — M1990 Unspecified osteoarthritis, unspecified site: Secondary | ICD-10-CM | POA: Insufficient documentation

## 2020-05-09 DIAGNOSIS — D251 Intramural leiomyoma of uterus: Secondary | ICD-10-CM | POA: Diagnosis not present

## 2020-05-09 DIAGNOSIS — Z7951 Long term (current) use of inhaled steroids: Secondary | ICD-10-CM | POA: Insufficient documentation

## 2020-05-09 DIAGNOSIS — R9389 Abnormal findings on diagnostic imaging of other specified body structures: Secondary | ICD-10-CM | POA: Diagnosis not present

## 2020-05-09 DIAGNOSIS — J4520 Mild intermittent asthma, uncomplicated: Secondary | ICD-10-CM | POA: Insufficient documentation

## 2020-05-09 DIAGNOSIS — N840 Polyp of corpus uteri: Secondary | ICD-10-CM | POA: Diagnosis not present

## 2020-05-09 DIAGNOSIS — N8500 Endometrial hyperplasia, unspecified: Secondary | ICD-10-CM | POA: Insufficient documentation

## 2020-05-09 HISTORY — DX: Unspecified osteoarthritis, unspecified site: M19.90

## 2020-05-09 HISTORY — DX: Iron deficiency anemia, unspecified: D50.9

## 2020-05-09 HISTORY — DX: Gastro-esophageal reflux disease without esophagitis: K21.9

## 2020-05-09 HISTORY — DX: Mild intermittent asthma, uncomplicated: J45.20

## 2020-05-09 HISTORY — DX: Other complications of anesthesia, initial encounter: T88.59XA

## 2020-05-09 HISTORY — DX: Presence of spectacles and contact lenses: Z97.3

## 2020-05-09 HISTORY — DX: Leiomyoma of uterus, unspecified: D25.9

## 2020-05-09 HISTORY — DX: Nausea with vomiting, unspecified: R11.2

## 2020-05-09 HISTORY — DX: Stress incontinence (female) (male): N39.3

## 2020-05-09 HISTORY — DX: Other chronic pain: G89.29

## 2020-05-09 HISTORY — PX: DILATATION & CURETTAGE/HYSTEROSCOPY WITH MYOSURE: SHX6511

## 2020-05-09 HISTORY — DX: Nausea with vomiting, unspecified: Z98.890

## 2020-05-09 HISTORY — DX: Presence of dental prosthetic device (complete) (partial): Z97.2

## 2020-05-09 LAB — BASIC METABOLIC PANEL
Anion gap: 9 (ref 5–15)
BUN: 16 mg/dL (ref 8–23)
CO2: 28 mmol/L (ref 22–32)
Calcium: 9.3 mg/dL (ref 8.9–10.3)
Chloride: 104 mmol/L (ref 98–111)
Creatinine, Ser: 0.79 mg/dL (ref 0.44–1.00)
GFR calc Af Amer: >60 mL/min (ref >60)
GFR calc non Af Amer: >60 mL/min (ref >60)
Glucose, Bld: 134 mg/dL — ABNORMAL HIGH (ref 70–99)
Potassium: 3.6 mmol/L (ref 3.5–5.1)
Sodium: 141 mmol/L (ref 135–145)

## 2020-05-09 LAB — CBC
HCT: 32.6 % — ABNORMAL LOW (ref 36.0–46.0)
Hemoglobin: 11.7 g/dL — ABNORMAL LOW (ref 12.0–15.0)
MCH: 24.2 pg — ABNORMAL LOW (ref 26.0–34.0)
MCHC: 35.9 g/dL (ref 30.0–36.0)
MCV: 67.4 fL — ABNORMAL LOW (ref 80.0–100.0)
Platelets: 222 10*3/uL (ref 150–400)
RBC: 4.84 MIL/uL (ref 3.87–5.11)
RDW: 18.1 % — ABNORMAL HIGH (ref 11.5–15.5)
WBC: 5.5 10*3/uL (ref 4.0–10.5)
nRBC: 0.5 % — ABNORMAL HIGH (ref 0.0–0.2)

## 2020-05-09 SURGERY — DILATATION & CURETTAGE/HYSTEROSCOPY WITH MYOSURE
Anesthesia: General | Site: Uterus

## 2020-05-09 MED ORDER — PROPOFOL 10 MG/ML IV BOLUS
INTRAVENOUS | Status: DC | PRN
  Administered 2020-05-09: 180 mg via INTRAVENOUS

## 2020-05-09 MED ORDER — CELECOXIB 200 MG PO CAPS
ORAL_CAPSULE | ORAL | Status: AC
  Filled 2020-05-09: qty 1

## 2020-05-09 MED ORDER — SODIUM CHLORIDE 0.9 % IR SOLN
Status: DC | PRN
  Administered 2020-05-09: 3000 mL

## 2020-05-09 MED ORDER — PROPOFOL 10 MG/ML IV BOLUS
INTRAVENOUS | Status: AC
  Filled 2020-05-09: qty 20

## 2020-05-09 MED ORDER — ONDANSETRON HCL 4 MG/2ML IJ SOLN
INTRAMUSCULAR | Status: DC | PRN
  Administered 2020-05-09: 4 mg via INTRAVENOUS

## 2020-05-09 MED ORDER — MIDAZOLAM HCL 2 MG/2ML IJ SOLN
INTRAMUSCULAR | Status: AC
  Filled 2020-05-09: qty 2

## 2020-05-09 MED ORDER — TRAMADOL-ACETAMINOPHEN 37.5-325 MG PO TABS: 1.0000 | ORAL_TABLET | Freq: Four times a day (QID) | ORAL | 0 refills | Status: AC | PRN

## 2020-05-09 MED ORDER — DEXAMETHASONE SODIUM PHOSPHATE 4 MG/ML IJ SOLN
INTRAMUSCULAR | Status: DC | PRN
  Administered 2020-05-09: 8 mg via INTRAVENOUS

## 2020-05-09 MED ORDER — ONDANSETRON HCL 4 MG/2ML IJ SOLN
INTRAMUSCULAR | Status: AC
  Filled 2020-05-09: qty 2

## 2020-05-09 MED ORDER — IBUPROFEN 800 MG PO TABS: 800.0000 mg | ORAL_TABLET | Freq: Three times a day (TID) | ORAL | 0 refills | Status: DC | PRN

## 2020-05-09 MED ORDER — CELECOXIB 200 MG PO CAPS
200.0000 mg | ORAL_CAPSULE | Freq: Once | ORAL | Status: AC
  Administered 2020-05-09: 200 mg via ORAL

## 2020-05-09 MED ORDER — DEXAMETHASONE SODIUM PHOSPHATE 10 MG/ML IJ SOLN
INTRAMUSCULAR | Status: AC
  Filled 2020-05-09: qty 1

## 2020-05-09 MED ORDER — LIDOCAINE 2% (20 MG/ML) 5 ML SYRINGE
INTRAMUSCULAR | Status: AC
  Filled 2020-05-09: qty 5

## 2020-05-09 MED ORDER — TRAMADOL-ACETAMINOPHEN 37.5-325 MG PO TABS: 1.0000 | ORAL_TABLET | Freq: Four times a day (QID) | ORAL | 0 refills | Status: DC | PRN

## 2020-05-09 MED ORDER — IBUPROFEN 800 MG PO TABS
800.0000 mg | ORAL_TABLET | Freq: Three times a day (TID) | ORAL | 0 refills | Status: DC | PRN
Start: 2020-05-09 — End: 2022-02-28

## 2020-05-09 MED ORDER — FENTANYL CITRATE (PF) 250 MCG/5ML IJ SOLN
INTRAMUSCULAR | Status: AC
  Filled 2020-05-09: qty 5

## 2020-05-09 MED ORDER — LIDOCAINE HCL (CARDIAC) PF 100 MG/5ML IV SOSY
PREFILLED_SYRINGE | INTRAVENOUS | Status: DC | PRN
  Administered 2020-05-09: 100 mg via INTRAVENOUS

## 2020-05-09 MED ORDER — POVIDONE-IODINE 10 % EX SWAB: 2.0000 "application " | Freq: Once | CUTANEOUS | Status: DC

## 2020-05-09 MED ORDER — ACETAMINOPHEN 500 MG PO TABS
ORAL_TABLET | ORAL | Status: AC
  Filled 2020-05-09: qty 2

## 2020-05-09 MED ORDER — FENTANYL CITRATE (PF) 100 MCG/2ML IJ SOLN
INTRAMUSCULAR | Status: DC | PRN
Start: 2020-05-09 — End: 2020-05-09
  Administered 2020-05-09 (×2): 50 ug via INTRAVENOUS

## 2020-05-09 MED ORDER — LIDOCAINE-EPINEPHRINE 1 %-1:100000 IJ SOLN
INTRAMUSCULAR | Status: DC | PRN
  Administered 2020-05-09: 10 mL

## 2020-05-09 MED ORDER — LACTATED RINGERS IV SOLN: INTRAVENOUS | Status: DC

## 2020-05-09 MED ORDER — ACETAMINOPHEN 500 MG PO TABS
1000.0000 mg | ORAL_TABLET | Freq: Four times a day (QID) | ORAL | Status: DC | PRN
  Administered 2020-05-09: 1000 mg via ORAL

## 2020-05-09 MED ORDER — MIDAZOLAM HCL 2 MG/2ML IJ SOLN
INTRAMUSCULAR | Status: DC | PRN
  Administered 2020-05-09: 1 mg via INTRAVENOUS

## 2020-05-09 MED FILL — IBUPROFEN 800 MG TAB: 800 | 7 days supply | Qty: 20 | Fill #0

## 2020-05-09 MED FILL — TRAMADOL-ACETAMINOPHN 37.5-: 37.5-325 | 2 days supply | Qty: 15 | Fill #0

## 2020-05-09 SURGICAL SUPPLY — 25 items
BIPOLAR CUTTING LOOP 21FR (ELECTRODE)
CANISTER SUCT 3000ML PPV (MISCELLANEOUS) IMPLANT
CATH ROBINSON RED A/P 16FR (CATHETERS) ×2 IMPLANT
COVER WAND RF STERILE (DRAPES) ×2 IMPLANT
DEVICE MYOSURE LITE (MISCELLANEOUS) IMPLANT
DEVICE MYOSURE REACH (MISCELLANEOUS) ×2 IMPLANT
DILATOR CANAL MILEX (MISCELLANEOUS) ×2 IMPLANT
GAUZE 4X4 16PLY RFD (DISPOSABLE) ×2 IMPLANT
GLOVE BIOGEL PI IND STRL 7.0 (GLOVE) ×1 IMPLANT
GLOVE BIOGEL PI INDICATOR 7.0 (GLOVE) ×1
GLOVE ECLIPSE 6.5 STRL STRAW (GLOVE) ×4 IMPLANT
GOWN STRL REUS W/ TWL LRG LVL3 (GOWN DISPOSABLE) ×1 IMPLANT
GOWN STRL REUS W/ TWL XL LVL3 (GOWN DISPOSABLE) ×1 IMPLANT
GOWN STRL REUS W/TWL LRG LVL3 (GOWN DISPOSABLE) ×2
GOWN STRL REUS W/TWL XL LVL3 (GOWN DISPOSABLE) ×2
IV NS IRRIG 3000ML ARTHROMATIC (IV SOLUTION) ×2 IMPLANT
KIT PROCEDURE FLUENT (KITS) ×2 IMPLANT
KIT TURNOVER CYSTO (KITS) ×2 IMPLANT
LOOP CUTTING BIPOLAR 21FR (ELECTRODE) IMPLANT
PACK VAGINAL MINOR WOMEN LF (CUSTOM PROCEDURE TRAY) ×2 IMPLANT
PAD OB MATERNITY 4.3X12.25 (PERSONAL CARE ITEMS) ×2 IMPLANT
SEAL CERVICAL OMNI LOK (ABLATOR) IMPLANT
SEAL ROD LENS SCOPE MYOSURE (ABLATOR) ×2 IMPLANT
TOWEL OR 17X26 10 PK STRL BLUE (TOWEL DISPOSABLE) ×2 IMPLANT
WATER STERILE IRR 500ML POUR (IV SOLUTION) IMPLANT

## 2020-05-09 NOTE — Anesthesia Procedure Notes (Signed)
Procedure Name: LMA Insertion Date/Time: 05/09/2020 9:42 AM Performed by: Georgeanne Nim, CRNA Pre-anesthesia Checklist: Patient identified, Emergency Drugs available, Suction available, Patient being monitored and Timeout performed Patient Re-evaluated:Patient Re-evaluated prior to induction Oxygen Delivery Method: Circle system utilized Preoxygenation: Pre-oxygenation with 100% oxygen Induction Type: IV induction LMA Size: 4.0 Placement Confirmation: positive ETCO2,  CO2 detector and breath sounds checked- equal and bilateral Tube secured with: Tape Dental Injury: Teeth and Oropharynx as per pre-operative assessment

## 2020-05-09 NOTE — Op Note (Signed)
05/09/2020  10:25 AM  PATIENT:  Lacey Barron  64 y.o. female  PRE-OPERATIVE DIAGNOSIS:  Thickened endometrium, uterine fibroid, endometrial mass  POST-OPERATIVE DIAGNOSIS:  Thickened endometrium, uterine fibroid, endometrial mass that is probably two fibroids, atrophic appearing endometrium  PROCEDURE:  Procedure(s): DILATATION & CURETTAGE/HYSTEROSCOPY WITH MYOSURE  RESECTION OF ENDOMETRIAL MASS  SURGEON:  Megan Salon  ASSISTANTS: OR staff   ANESTHESIA:   general  ESTIMATED BLOOD LOSS: 5 mL  BLOOD ADMINISTERED:none   FLUIDS: 800cc LR  UOP: 400 cc clear UPO  SPECIMEN:  Endometrial tissue from curettage and probable fibroid tissue  DISPOSITION OF SPECIMEN:  PATHOLOGY  FINDINGS: two lesions that are more consistent with fibroids due to consistency of tissue with resection with Myosure  DESCRIPTION OF OPERATION: Patient was taken to the operating room.  She is placed in the supine position. SCDs were on her lower extremities and functioning properly. General anesthesia with an LMA was administered without difficulty. Dr. Duane Boston, anesthesia, oversaw case.  Legs were then placed in the [place] in the low lithotomy position. The legs were lifted to the high lithotomy position and the Betadine prep was used on the inner thighs perineum and vagina x3. Patient was draped in a normal standard fashion. An in and out catheterization with a red rubber Foley catheter was performed. Approximately 400 cc of clear urine was noted. A bivalve speculum was placed the vagina. The anterior lip of the cervix was grasped with single-tooth tenaculum.  A paracervical block of 1% lidocaine mixed one-to-one with epinephrine (1:100,000 units).  10 cc was used total. The cervix is dilated up to #[facility] dilators. The endometrial cavity sounded to 9 cm.   A Myosure hysteroscope was obtained. Normal saline was used as a hysteroscopic fluid. The hysteroscope was advanced through the  endocervical canal into the endometrial cavity. The tubal ostia were noted bilaterally. Additional findings included two probable fibroids.  Using the Myosure reach, these lesions were resected until flush with the endometrium.  The hysteroscope was removed. A #1 toothed curette was used to curette the cavity until rough gritty texture was noted in all quadrants. With revisualization of the hysteroscope, there was no longer any abnormal findings.  At this point no other procedure was needed and this procedure was ended. The hysteroscope was removed. The fluid deficit was 120 cc. The tenaculum was removed from the anterior lip of the cervix. The speculum was removed from the vagina. The prep was cleansed of the patient's skin. The legs are positioned back in the supine position. Sponge, lap, needle, initially counts were correct x2. Patient was taken to recovery in stable condition.  COUNTS:  YES  PLAN OF CARE: Transfer to PACU

## 2020-05-09 NOTE — Anesthesia Postprocedure Evaluation (Signed)
Anesthesia Post Note  Patient: Lacey Barron  Procedure(s) Performed: DILATATION & CURETTAGE/HYSTEROSCOPY WITH MYOSURE  RESECTION OF ENDOMETRIAL MASS (N/A Uterus)     Patient location during evaluation: PACU Anesthesia Type: General Level of consciousness: sedated Pain management: pain level controlled Vital Signs Assessment: post-procedure vital signs reviewed and stable Respiratory status: spontaneous breathing and respiratory function stable Cardiovascular status: stable Postop Assessment: no apparent nausea or vomiting Anesthetic complications: no   No complications documented.  Last Vitals:  Vitals:   05/09/20 1100 05/09/20 1137  BP: 137/82 128/78  Pulse: 79 85  Resp: 20 16  Temp:  36.7 C  SpO2: 97% 96%    Last Pain:  Vitals:   05/09/20 1137  TempSrc:   PainSc: 3                  Manasvini Whatley DANIEL

## 2020-05-09 NOTE — Discharge Instructions (Addendum)
Post-surgical Instructions, Outpatient Surgery  You may expect to feel dizzy, weak, and drowsy for as long as 24 hours after receiving the medicine that made you sleep (anesthetic). For the first 24 hours after your surgery:    Do not drive a car, ride a bicycle, participate in physical activities, or take public transportation until you are done taking narcotic pain medicines or as directed by Dr. Sabra Heck.   Do not drink alcohol or take tranquilizers.   Do not take medicine that has not been prescribed by your physicians.   Do not sign important papers or make important decisions while on narcotic pain medicines.   Have a responsible person with you.   PAIN MANAGEMENT  Motrin 800mg .  (This is the same as 4-200mg  over the counter tablets of Motrin or ibuprofen.)  You may take this every eight hours or as needed for cramping.  DO NOT take mobic if taking this motrin.  Ultracet 37.5/325.  For more severe pain, take one or two tablets every four to six hours as needed for pain control.   DO'S AND DON'T'S  Do not take a tub bath for one week.  You may shower on the first day after your surgery  Do not do any heavy lifting for one to two weeks.  This increases the chance of bleeding.  Do move around as you feel able.  Stairs are fine.  You may begin to exercise again as you feel able.  Do not lift any weights for two weeks.  Do not put anything in the vagina for two weeks--no tampons, intercourse, or douching.    REGULAR MEDIATIONS/VITAMINS:  You may restart all of your regular medications as prescribed.  You may restart all of your vitamins as you normally take them.    PLEASE CALL OR SEEK MEDICAL CARE IF:  You have persistent nausea and vomiting.   You have trouble eating or drinking.   You have an oral temperature above 100.5.   You have constipation that is not helped by adjusting diet or increasing fluid intake. Pain medicines are a common cause of constipation.   You  have heavy vaginal bleeding    Post Anesthesia Home Care Instructions  Activity: Get plenty of rest for the remainder of the day. A responsible adult should stay with you for 24 hours following the procedure.  For the next 24 hours, DO NOT: -Drive a car -Paediatric nurse -Drink alcoholic beverages -Take any medication unless instructed by your physician -Make any legal decisions or sign important papers.  Meals: Start with liquid foods such as gelatin or soup. Progress to regular foods as tolerated. Avoid greasy, spicy, heavy foods. If nausea and/or vomiting occur, drink only clear liquids until the nausea and/or vomiting subsides. Call your physician if vomiting continues.  Special Instructions/Symptoms: Your throat may feel dry or sore from the anesthesia or the breathing tube placed in your throat during surgery. If this causes discomfort, gargle with warm salt water. The discomfort should disappear within 24 hours.  If you had a scopolamine patch placed behind your ear for the management of post- operative nausea and/or vomiting:  1. The medication in the patch is effective for 72 hours, after which it should be removed.  Wrap patch in a tissue and discard in the trash. Wash hands thoroughly with soap and water. 2. You may remove the patch earlier than 72 hours if you experience unpleasant side effects which may include dry mouth, dizziness or visual disturbances. 3.  Avoid touching the patch. Wash your hands with soap and water after contact with the patch.

## 2020-05-09 NOTE — Transfer of Care (Signed)
Immediate Anesthesia Transfer of Care Note  Patient: Lacey Barron  Procedure(s) Performed: DILATATION & CURETTAGE/HYSTEROSCOPY WITH MYOSURE  RESECTION OF ENDOMETRIAL MASS (N/A Uterus)  Patient Location: PACU  Anesthesia Type:General  Level of Consciousness: awake, alert , oriented and patient cooperative  Airway & Oxygen Therapy: Patient Spontanous Breathing  Post-op Assessment: Report given to RN and Post -op Vital signs reviewed and stable  Post vital signs: Reviewed and stable  Last Vitals:  Vitals Value Taken Time  BP    Temp    Pulse 91 05/09/20 1022  Resp 18 05/09/20 1022  SpO2 98 % 05/09/20 1022  Vitals shown include unvalidated device data.  Last Pain:  Vitals:   05/09/20 0800  TempSrc: Oral  PainSc: 0-No pain      Patients Stated Pain Goal: 5 (84/16/60 6301)  Complications: No complications documented.

## 2020-05-09 NOTE — H&P (Signed)
Lacey Barron is an 64 y.o. female G27P3 MAAF here for hysteroscopy with probable polyp resection, D&C due to endometrial polyp and PMP bleeding.  I have been unable to do an office biopsy.  This was seen a year ago but pt declined to schedule then.  Repeat ultrasound done 04/14/2020 showing same endometrial findings.  She decided at this time to proceed.  Risks, benefits and alternatives discussed.    Pertinent Gynecological History: Menses: post-menopausal Bleeding: none Contraception: PMP DES exposure: denies Blood transfusions: none Sexually transmitted diseases: no past history Previous GYN Procedures: none  Last mammogram: normal Date: 04/05/2020 Last pap: normal Date: 02/2020 OB History: G3, P3   Menstrual History: Patient's last menstrual period was 10/14/2013.    Past Medical History:  Diagnosis Date  . Chronic knee pain    bilateral   . Complication of anesthesia    hard to wake  . Endometrial mass   . GERD (gastroesophageal reflux disease)   . History of abnormal cervical Pap smear 2019  . Hypertension    followed by pcp  . IDA (iron deficiency anemia)   . Mild intermittent asthma    followed by pcp  . OA (osteoarthritis)    shoulders and knees  . PONV (postoperative nausea and vomiting)   . Sickle cell trait ([place])   . SUI (stress urinary incontinence, female)   . Thickened endometrium   . Uterine fibroid   . Wears glasses   . Wears partial dentures    upper and lower    Past Surgical History:  Procedure Laterality Date  . COLONOSCOPY  per pt scheduled 05-06-2020  . TUBAL LIGATION Bilateral 1980s    Family History  Problem Relation Age of Onset  . Diabetes Mother        Passed away at 62  . Kidney disease Mother   . Hypertension Mother   . Colon cancer Father        Passed away at 34  . Heart attack Brother   . Diabetes Brother   . Esophageal cancer Neg Hx   . Stomach cancer Neg Hx   . Rectal cancer Neg Hx     Social History:  reports that  she has never smoked. She has never used smokeless tobacco. She reports that she does not drink alcohol and does not use drugs.  Allergies:  Allergies  Allergen Reactions  . Hydrocodone Nausea And Vomiting    Medications Prior to Admission  Medication Sig Dispense Refill Last Dose  . albuterol (VENTOLIN HFA) 108 (90 Base) MCG/ACT inhaler Inhale 2 puffs into the lungs every 4 (four) hours as needed for wheezing or shortness of breath. 18 g 2 05/08/2020 at Unknown time  . amLODipine (NORVASC) 5 MG tablet Take 1 tablet (5 mg total) by mouth daily. (Patient taking differently: Take 5 mg by mouth daily. ) 90 tablet 2 05/08/2020 at Unknown time  . baclofen (LIORESAL) 10 MG tablet Take 1 tablet (10 mg total) by mouth 3 (three) times daily as needed for muscle spasms. 30 each 1 Past Week at Unknown time  . calcium carbonate (TUMS - DOSED IN MG ELEMENTAL CALCIUM) 500 MG chewable tablet Chew 1 tablet by mouth as needed for indigestion or heartburn.   05/08/2020 at Unknown time  . cetirizine (ZYRTEC) 10 MG tablet Take 1 tablet (10 mg total) by mouth daily. 90 tablet 1 Past Week at Unknown time  . Cyanocobalamin (VITAMIN B-12) 1000 MCG SUBL Place 1 tablet under the tongue once.  05/08/2020 at Unknown time  . diclofenac Sodium (VOLTAREN) 1 % GEL Apply topically 4 (four) times daily as needed.   Past Week at Unknown time  . fluticasone (FLONASE) 50 MCG/ACT nasal spray Place 2 sprays into both nostrils daily. (Patient taking differently: Place 2 sprays into both nostrils daily. ) 16 g 2 Past Week at Unknown time  . lisinopril-hydrochlorothiazide (ZESTORETIC) 20-25 MG tablet Take 1 tablet by mouth daily. (Patient taking differently: Take 1 tablet by mouth daily. ) 90 tablet 2 05/08/2020 at Unknown time  . meloxicam (MOBIC) 15 MG tablet Take 15 mg by mouth daily as needed for pain.   Past Week at Unknown time  . montelukast (SINGULAIR) 10 MG tablet Take 1 tablet (10 mg total) by mouth at bedtime. (Patient taking  differently: Take 10 mg by mouth at bedtime. ) 90 tablet 2 Past Week at Unknown time    Review of Systems  All other systems reviewed and are negative.   Blood pressure (!) 155/88, pulse 91, temperature 99 F (37.2 C), temperature source Oral, resp. rate 15, height 5\' 6"  (1.676 m), weight 96.6 kg, last menstrual period 10/14/2013, SpO2 99 %. Physical Exam Constitutional:      Appearance: Normal appearance.  Cardiovascular:     Heart sounds: Normal heart sounds. No murmur heard.   Pulmonary:     Effort: Pulmonary effort is normal.     Breath sounds: Normal breath sounds.  Musculoskeletal:     Cervical back: Normal range of motion.  Neurological:     General: No focal deficit present.     Mental Status: She is alert.  Psychiatric:        Mood and Affect: Mood normal.     Results for orders placed or performed during the hospital encounter of 05/09/20 (from the past 24 hour(s))  CBC     Status: Abnormal   Collection Time: 05/09/20  7:48 AM  Result Value Ref Range   WBC 5.5 4.0 - 10.5 K/uL   RBC 4.84 3.87 - 5.11 MIL/uL   Hemoglobin 11.7 (L) 12.0 - 15.0 g/dL   HCT 32.6 (L) 36 - 46 %   MCV 67.4 (L) 80.0 - 100.0 fL   MCH 24.2 (L) 26.0 - 34.0 pg   MCHC 35.9 30.0 - 36.0 g/dL   RDW 18.1 (H) 11.5 - 15.5 %   Platelets 222 150 - 400 K/uL   nRBC 0.5 (H) 0.0 - 0.2 %  Basic metabolic panel per protocol     Status: Abnormal   Collection Time: 05/09/20  7:59 AM  Result Value Ref Range   Sodium 141 135 - 145 mmol/L   Potassium 3.6 3.5 - 5.1 mmol/L   Chloride 104 98 - 111 mmol/L   CO2 28 22 - 32 mmol/L   Glucose, Bld 134 (H) 70 - 99 mg/dL   BUN 16 8 - 23 mg/dL   Creatinine, Ser 0.79 0.44 - 1.00 mg/dL   Calcium 9.3 8.9 - 10.3 mg/dL   GFR calc non Af Amer >60 >60 mL/min   GFR calc Af Amer >60 >60 mL/min   Anion gap 9 5 - 15    No results found.  Assessment/Plan: 64 yo G3P3 MAAF with endometrial polyp here for hysteroscopy, polyp resection, D&C.  Questions answered.  Pt ready to  proceed.  Megan Salon 05/09/2020, 9:29 AM

## 2020-05-10 ENCOUNTER — Telehealth: Payer: Self-pay | Admitting: *Deleted

## 2020-05-10 ENCOUNTER — Encounter (HOSPITAL_BASED_OUTPATIENT_CLINIC_OR_DEPARTMENT_OTHER): Payer: Self-pay | Admitting: Obstetrics & Gynecology

## 2020-05-10 LAB — SURGICAL PATHOLOGY

## 2020-05-10 NOTE — Telephone Encounter (Signed)
°  Follow up Call-  Call back number 05/06/2020  Post procedure Call Back phone  # [phone]  Permission to leave phone message Yes  Some recent data might be hidden    LMOM to call back with any questions or concerns.  Also, call back if patient has developed fever, respiratory issues or been dx with COVID or had any family members or close contacts diagnosed since her procedure.

## 2020-05-10 NOTE — Telephone Encounter (Signed)
°  Follow up Call-  Call back number 05/06/2020  Post procedure Call Back phone  # [phone]  Permission to leave phone message Yes  Some recent data might be hidden     First attempt for follow up phone call. No answer at number given.  Left message on voicemail.

## 2020-05-13 ENCOUNTER — Encounter: Payer: Self-pay | Admitting: Gastroenterology

## 2020-05-23 NOTE — Progress Notes (Signed)
Post Operative Visit  Procedure: D&C/hysteroscopy with myosure resection of endometrial mass Days Post-op: 2 week post op  Subjective: Doing well.  Has spotting, dark discharge for a few days.  Stopped early this week.  Had no post op pain.  Back at work.  Wants to know when can be SA.  Pictures and pathology reviewed.  Objective: BP 118/80   Pulse 70   Resp 16   Wt 215 lb (97.5 kg)   LMP 10/14/2013   BMI 34.70 kg/m   EXAM General: alert, cooperative and no distress GI: soft, non tender Extremities: extremities normal, atraumatic, no cyanosis or edema Vaginal Bleeding: none  Gyn: vaginal without lesions, cervix closed, uterus enlarged and globular c/w fibroids, non tender  Assessment: s/p hysteroscopic resection of polyp and fibroid  Plan: Pt is release to normal activity.  Will follow up with Dr. Nita Sells but knows she can call for any new concerns.  Post op bleeding with activity discussed.

## 2020-05-26 ENCOUNTER — Other Ambulatory Visit: Payer: Self-pay

## 2020-05-26 ENCOUNTER — Encounter: Payer: Self-pay | Admitting: Obstetrics & Gynecology

## 2020-05-26 ENCOUNTER — Ambulatory Visit (INDEPENDENT_AMBULATORY_CARE_PROVIDER_SITE_OTHER): Payer: 59 | Admitting: Obstetrics & Gynecology

## 2020-05-26 VITALS — BP 118/80 | HR 70 | Resp 16 | Wt 215.0 lb

## 2020-05-26 DIAGNOSIS — D250 Submucous leiomyoma of uterus: Secondary | ICD-10-CM | POA: Diagnosis not present

## 2020-05-26 DIAGNOSIS — N840 Polyp of corpus uteri: Secondary | ICD-10-CM

## 2020-07-23 ENCOUNTER — Telehealth: Payer: 59 | Admitting: Nurse Practitioner

## 2020-07-23 DIAGNOSIS — B9789 Other viral agents as the cause of diseases classified elsewhere: Secondary | ICD-10-CM | POA: Diagnosis not present

## 2020-07-23 DIAGNOSIS — J329 Chronic sinusitis, unspecified: Secondary | ICD-10-CM | POA: Diagnosis not present

## 2020-07-23 MED ORDER — FLUTICASONE PROPIONATE 50 MCG/ACT NA SUSP: 2.0000 | Freq: Every day | NASAL | 6 refills | Status: DC

## 2020-07-23 NOTE — Progress Notes (Signed)

## 2020-08-05 MED FILL — AMLODIPINE BESYLATE 5 MG TA: 5 | 90 days supply | Qty: 90 | Fill #1

## 2020-08-05 MED FILL — MONTELUKAST SOD 10 MG TAB: 10 | 90 days supply | Qty: 90 | Fill #1

## 2020-08-05 MED FILL — FLUTICASONE PROP 50 MCG SPR: 50 | 30 days supply | Qty: 16 | Fill #1

## 2020-08-05 MED FILL — ALBUTEROL SULFATE HFA 108 (: 108 (90 BAS | 17 days supply | Qty: 18 | Fill #1

## 2020-08-05 MED FILL — LISINOPRIL-HCTZ 20-25 MG TA: 20-25 | 90 days supply | Qty: 90 | Fill #1

## 2020-08-10 ENCOUNTER — Encounter: Payer: Self-pay | Admitting: Family Medicine

## 2020-08-10 ENCOUNTER — Telehealth: Payer: Self-pay

## 2020-08-10 ENCOUNTER — Ambulatory Visit
Admission: RE | Admit: 2020-08-10 | Discharge: 2020-08-10 | Disposition: A | Discharge status: Home / Self Care | Payer: 59 | Source: Ambulatory Visit | Attending: Family Medicine | Admitting: Family Medicine

## 2020-08-10 DIAGNOSIS — M25562 Pain in left knee: Secondary | ICD-10-CM

## 2020-08-10 DIAGNOSIS — M1711 Unilateral primary osteoarthritis, right knee: Secondary | ICD-10-CM | POA: Diagnosis not present

## 2020-08-10 DIAGNOSIS — G8929 Other chronic pain: Secondary | ICD-10-CM

## 2020-08-10 NOTE — Telephone Encounter (Signed)
Radiology calls nurse line making sure PCP has received imaging done today. Please advise.

## 2020-08-11 NOTE — Telephone Encounter (Signed)
Will forward to ordering provider.

## 2020-08-12 ENCOUNTER — Other Ambulatory Visit: Payer: Self-pay | Admitting: Family Medicine

## 2020-08-12 DIAGNOSIS — M25562 Pain in left knee: Secondary | ICD-10-CM

## 2020-08-12 DIAGNOSIS — S82142A Displaced bicondylar fracture of left tibia, initial encounter for closed fracture: Secondary | ICD-10-CM

## 2020-08-12 DIAGNOSIS — G8929 Other chronic pain: Secondary | ICD-10-CM

## 2020-08-12 NOTE — Telephone Encounter (Signed)
Results discussed with patient and referral to ortho placed.

## 2020-08-18 ENCOUNTER — Encounter: Payer: Self-pay | Admitting: Surgery

## 2020-08-18 ENCOUNTER — Ambulatory Visit (INDEPENDENT_AMBULATORY_CARE_PROVIDER_SITE_OTHER): Payer: 59 | Admitting: Surgery

## 2020-08-18 ENCOUNTER — Other Ambulatory Visit: Payer: Self-pay

## 2020-08-18 VITALS — BP 149/90 | HR 112

## 2020-08-18 DIAGNOSIS — G8929 Other chronic pain: Secondary | ICD-10-CM | POA: Diagnosis not present

## 2020-08-18 DIAGNOSIS — M25562 Pain in left knee: Secondary | ICD-10-CM | POA: Diagnosis not present

## 2020-08-18 MED ORDER — LIDOCAINE HCL 1 % IJ SOLN
3.0000 mL | INTRAMUSCULAR | Status: AC | PRN
  Administered 2020-08-18: 3 mL

## 2020-08-18 MED ORDER — BUPIVACAINE HCL 0.25 % IJ SOLN
6.0000 mL | INTRAMUSCULAR | Status: AC | PRN
  Administered 2020-08-18: 6 mL via INTRA_ARTICULAR

## 2020-08-18 NOTE — Progress Notes (Signed)
Office Visit Note   Patient: Lacey Barron           Date of Birth: 07/24/1956           MRN: [id number] Visit Date: 08/18/2020              Requested by: Sharion Settler, DO [place],  [place] [address] PCP: Sharion Settler, DO   Assessment & Plan: Visit Diagnoses:  1. Chronic pain of left knee   2. Mechanical knee pain, left     Plan: I recommended conservative management with aspiration and injection.  After patient consent left knee was prepped with Betadine and aspiration was performed and then I did a Marcaine/betamethasone intra-articular injection.  Patient tolerated procedure well and did have improvement of her pain and range of motion after this was done.  I did discuss previous x-ray findings with patient.  Advised her that I am not alarmed about the finding of the area of calcification that was seen at the medial tibial plateau.  I do not think this is causing her current problem.  I discussed with patient that she may have a possible meniscal tear.  We will see how she does with the injection.  Follow-up me in 3 weeks for recheck and if she has not had any improvement then I will plan to order MRI.  Follow-Up Instructions: Return in about 3 weeks (around 09/08/2020) for With [facility] recheck left knee.   Orders:  Orders Placed This Encounter  Procedures  . Large Joint Inj   No orders of the defined types were placed in this encounter.     Procedures: Large Joint Inj: L knee on 08/18/2020 3:58 PM Indications: pain and joint swelling Details: 22 G 1.5 in needle, superolateral approach Medications: 3 mL lidocaine 1 %; 6 mL bupivacaine 0.25 % Aspirate: 61 mL serous Outcome: tolerated well, no immediate complications  Had a patient consent 61 cc of serous fluid was aspirated from a superior lateral patellar approach and intra-articular Marcaine/betamethasone 6 to 1 injection performed.  Tolerated well. Consent was given by the patient. Patient  was prepped and draped in the usual sterile fashion.       Clinical Data: No additional findings.   Subjective: Chief Complaint  Patient presents with  . Left Knee - Pain    HPI 64 year old black female who is new patient to clinic comes in today with complaints of left knee pain mechanical symptoms.  Patient states that the lumbar 2020 she tripped out in a parking lot causing a forceful flexion and twisting type injury to the left knee.  Knee had continue to be bothersome over the next several months.  States that she eventually discussed this matter with her primary care physician July 2021 at her physical and some home exercises were given.  States that she had some improvement of her knee pain but this again became worse around October of this year.  Pain when she is up and ambulating and squatting.  Some feeling mechanical symptoms in the knee giving away.  Has had some swelling with decreased knee range of motion.  Has tried meloxicam without any improvement.  Primary care physician ordered x-ray August 10, 2020.  And this showed suspected small avulsion along the medial most aspect of the medial tibial plateau.  No other evident fracture.  No dislocation or joint effusion.  No appreciable joint space narrowing.  Small spur along the anterior superior patella likely represents distal quadriceps tendinosis.  Patient was advised of a possible fracture of her medial tibia and urgent referral was made to our office. Review of Systems No current cardiac pulmonary GI GU issues  Objective: Vital Signs: BP (!) 149/90   Pulse (!) 112   LMP 10/14/2013   Physical Exam HENT:     Head: Normocephalic and atraumatic.  Pulmonary:     Effort: No respiratory distress.  Musculoskeletal:     Comments: Gait is antalgic.  Negative logroll bilateral hips.  Left knee range of motion about 0 to 90 degrees with discomfort.  Left knee is swollen with palpable large effusion.  Medial greater than lateral  joint line tenderness.  Positive Murray's test.  Cruciate collateral ligaments are stable.  Posterior knee swelling and tender to palpation.  Calf nontender.  Neurological:     General: No focal deficit present.     Mental Status: She is alert and oriented to person, place, and time.     Ortho Exam  Specialty Comments:  No specialty comments available.  Imaging: No results found.   PMFS History: Patient Active Problem List   Diagnosis Date Noted  . Chronic pain of both knees 02/25/2020  . Urinary incontinence 02/19/2019  . 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 Allergic rhinitis 11/10/2015  . Asthma 11/10/2015  . CLAUDICATION, INTERMITTENT 06/12/2010  . ANEMIA-IRON DEFICIENCY 07/20/2008  . Obesity 07/19/2008  . HYPERTENSION, BENIGN, MILD 07/19/2008   Past Medical History:  Diagnosis Date  . Chronic knee pain    bilateral   . Complication of anesthesia    hard to wake  . GERD (gastroesophageal reflux disease)   . History of abnormal cervical Pap smear 2019  . Hypertension    followed by pcp  . IDA (iron deficiency anemia)   . Mild intermittent asthma    followed by pcp  . OA (osteoarthritis)    shoulders and knees  . PONV (postoperative nausea and vomiting)   . Sickle cell trait (HCC)   . SUI (stress urinary incontinence, female)   . Uterine fibroid   . Wears glasses   . Wears partial dentures    upper and lower    Family History  Problem Relation Age of Onset  . Diabetes Mother        Passed away at 24  . Kidney disease Mother   . Hypertension Mother   . Colon cancer Father        Passed away at 77  . Heart attack Brother   . Diabetes Brother   . Esophageal cancer Neg Hx   . Stomach cancer Neg Hx   . Rectal cancer Neg Hx     Past Surgical History:  Procedure Laterality Date  . COLONOSCOPY  per pt scheduled 05-06-2020  . DILATATION & CURETTAGE/HYSTEROSCOPY WITH MYOSURE N/A 05/09/2020   Procedure: DILATATION & CURETTAGE/HYSTEROSCOPY WITH MYOSURE  RESECTION OF  ENDOMETRIAL MASS;  Surgeon: Jerene Bears, MD;  Location: [facility];  Service: Gynecology;  Laterality: N/A;  polyp resection  . TUBAL LIGATION Bilateral 1980s   Social History   Occupational History  . Not on file  Tobacco Use  . Smoking status: Never Smoker  . Smokeless tobacco: Never Used  Vaping Use  . Vaping Use: Never used  Substance and Sexual Activity  . Alcohol use: Never    Alcohol/week: 0.0 standard drinks  . Drug use: Never  . Sexual activity: Not Currently    Birth control/protection: Post-menopausal, Surgical

## 2020-09-30 ENCOUNTER — Other Ambulatory Visit (HOSPITAL_COMMUNITY): Payer: Self-pay | Admitting: Family Medicine

## 2020-09-30 MED FILL — METHOCARBAMOL 500 MG TABS: 500 | 10 days supply | Qty: 30 | Fill #0

## 2020-10-13 ENCOUNTER — Other Ambulatory Visit (HOSPITAL_COMMUNITY): Payer: Self-pay | Admitting: Family Medicine

## 2020-10-13 ENCOUNTER — Other Ambulatory Visit: Payer: Self-pay

## 2020-10-13 ENCOUNTER — Ambulatory Visit (HOSPITAL_COMMUNITY)
Admission: RE | Admit: 2020-10-13 | Discharge: 2020-10-13 | Disposition: A | Discharge status: Home / Self Care | Payer: PRIVATE HEALTH INSURANCE | Source: Ambulatory Visit | Attending: Family Medicine | Admitting: Family Medicine

## 2020-10-13 DIAGNOSIS — M25462 Effusion, left knee: Secondary | ICD-10-CM | POA: Diagnosis not present

## 2020-10-13 DIAGNOSIS — M25511 Pain in right shoulder: Secondary | ICD-10-CM | POA: Diagnosis not present

## 2020-10-13 DIAGNOSIS — M1712 Unilateral primary osteoarthritis, left knee: Secondary | ICD-10-CM | POA: Diagnosis not present

## 2020-10-13 DIAGNOSIS — M25562 Pain in left knee: Secondary | ICD-10-CM

## 2020-10-24 ENCOUNTER — Other Ambulatory Visit (HOSPITAL_COMMUNITY): Payer: Self-pay | Admitting: Physician Assistant

## 2020-10-24 MED FILL — predniSONE 10 MG TABS: 10 | 6 days supply | Qty: 21 | Fill #0

## 2020-10-24 MED FILL — BACLOFEN 10 MG TABS: 10 | 15 days supply | Qty: 60 | Fill #0

## 2020-10-24 MED FILL — TRAMADOL-ACETAMINOPHN 37.5-: 37.5-325 | 10 days supply | Qty: 60 | Fill #0

## 2020-10-25 ENCOUNTER — Other Ambulatory Visit (HOSPITAL_COMMUNITY): Payer: Self-pay | Admitting: Physician Assistant

## 2020-10-25 ENCOUNTER — Other Ambulatory Visit: Payer: Self-pay | Admitting: Physician Assistant

## 2020-10-25 DIAGNOSIS — M25562 Pain in left knee: Secondary | ICD-10-CM

## 2020-11-01 ENCOUNTER — Other Ambulatory Visit: Payer: Self-pay

## 2020-11-01 ENCOUNTER — Ambulatory Visit (HOSPITAL_COMMUNITY)
Admission: RE | Admit: 2020-11-01 | Discharge: 2020-11-01 | Disposition: A | Discharge status: Home / Self Care | Payer: PRIVATE HEALTH INSURANCE | Source: Ambulatory Visit | Attending: Physician Assistant | Admitting: Physician Assistant

## 2020-11-01 ENCOUNTER — Ambulatory Visit (INDEPENDENT_AMBULATORY_CARE_PROVIDER_SITE_OTHER): Payer: PRIVATE HEALTH INSURANCE | Admitting: Rehabilitative and Restorative Service Providers"

## 2020-11-01 ENCOUNTER — Encounter: Payer: Self-pay | Admitting: Rehabilitative and Restorative Service Providers"

## 2020-11-01 DIAGNOSIS — S83242A Other tear of medial meniscus, current injury, left knee, initial encounter: Secondary | ICD-10-CM | POA: Diagnosis not present

## 2020-11-01 DIAGNOSIS — R293 Abnormal posture: Secondary | ICD-10-CM

## 2020-11-01 DIAGNOSIS — M25562 Pain in left knee: Secondary | ICD-10-CM | POA: Diagnosis not present

## 2020-11-01 DIAGNOSIS — M542 Cervicalgia: Secondary | ICD-10-CM

## 2020-11-01 DIAGNOSIS — M7122 Synovial cyst of popliteal space [Baker], left knee: Secondary | ICD-10-CM | POA: Diagnosis not present

## 2020-11-01 DIAGNOSIS — M25462 Effusion, left knee: Secondary | ICD-10-CM | POA: Diagnosis not present

## 2020-11-01 DIAGNOSIS — R29898 Other symptoms and signs involving the musculoskeletal system: Secondary | ICD-10-CM | POA: Diagnosis not present

## 2020-11-01 NOTE — Patient Instructions (Addendum)
TENS UNIT: This is helpful for muscle pain and spasm.   Search and Purchase a TENS 7000 2nd edition at www.tenspros.com. It should be less than $30.     TENS unit instructions: Do not shower or bathe with the unit on Turn the unit off before removing electrodes or batteries If the electrodes lose stickiness add a drop of water to the electrodes after they are disconnected from the unit and place on plastic sheet. If you continued to have difficulty, call the TENS unit company to purchase more electrodes. Do not apply lotion on the skin area prior to use. Make sure the skin is clean and dry as this will help prolong the life of the electrodes. After use, always check skin for unusual red areas, rash or other skin difficulties. If there are any skin problems, does not apply electrodes to the same area. Never remove the electrodes from the unit by pulling the wires. Do not use the TENS unit or electrodes other than as directed. Do not change electrode placement without consultating your therapist or physician. Keep 2 fingers with between each electrode.   Trigger Point Dry Needling  . What is Trigger Point Dry Needling (DN)? o DN is a physical therapy technique used to treat muscle pain and dysfunction. Specifically, DN helps deactivate muscle trigger points (muscle knots).  o A thin filiform needle is used to penetrate the skin and stimulate the underlying trigger point. The goal is for a local twitch response (LTR) to occur and for the trigger point to relax. No medication of any kind is injected during the procedure.   . What Does Trigger Point Dry Needling Feel Like?  o The procedure feels different for each individual patient. Some patients report that they do not actually feel the needle enter the skin and overall the process is not painful. Very mild bleeding may occur. However, many patients feel a deep cramping in the muscle in which the needle was inserted. This is the local twitch  response.   Marland Kitchen How Will I feel after the treatment? o Soreness is normal, and the onset of soreness may not occur for a few hours. Typically this soreness does not last longer than two days.  o Bruising is uncommon, however; ice can be used to decrease any possible bruising.  o In rare cases feeling tired or nauseous after the treatment is normal. In addition, your symptoms may get worse before they get better, this period will typically not last longer than 24 hours.   . What Can I do After My Treatment? o Increase your hydration by drinking more water for the next 24 hours. o You may place ice or heat on the areas treated that have become sore, however, do not use heat on inflamed or bruised areas. Heat often brings more relief post needling. o You can continue your regular activities, but vigorous activity is not recommended initially after the treatment for 24 hours. o DN is best combined with other physical therapy such as strengthening, stretching, and other therapies.    Access Code: [email or web address]: [email or web address]: 03/15/2022Prepared by: Lasonia Casino HoltExercises  Seated Scapular Retraction - 2 x daily - 7 x weekly - 1-2 sets - 10 reps - 10 sec hold  Seated Cervical Retraction - 2 x daily - 7 x weekly - 1-2 sets - 5-10 reps - 10 sec hold  Seated Cervical Sidebending AROM - 2 x daily - 7 x weekly - 1 sets - 5 reps - 5-10 sec hold  Seated Cervical Rotation AROM - 2 x daily - 7 x weekly - 1 sets - 5 reps - 2-3 sec hold  Standing Backward Shoulder Rolls - 2 x daily - 7 x weekly - 1 sets - 10 reps - 1-2 sec hold Patient Education  Office Posture

## 2020-11-01 NOTE — Therapy (Signed)
[facility] [place], [place], [address] Phone: [phone]   Fax:  [phone]  Physical Therapy Evaluation  Patient Details  Name: Lacey Barron MRN: [id number] Date of Birth: [birth year] Referring Provider (PT): Cline Crock, [place]   Encounter Date: 11/01/2020   PT End of Session - 11/01/20 0955    Visit Number 1    Number of Visits 12    Date for PT Re-Evaluation 12/13/20    PT Start Time 0846    PT Stop Time 0950    PT Time Calculation (min) 64 min    Activity Tolerance Patient tolerated treatment well           Past Medical History:  Diagnosis Date  . Chronic knee pain    bilateral   . Complication of anesthesia    hard to wake  . GERD (gastroesophageal reflux disease)   . History of abnormal cervical Pap smear 2019  . Hypertension    followed by pcp  . IDA (iron deficiency anemia)   . Mild intermittent asthma    followed by pcp  . OA (osteoarthritis)    shoulders and knees  . PONV (postoperative nausea and vomiting)   . Sickle cell trait ([facility])   . SUI (stress urinary incontinence, female)   . Uterine fibroid   . Wears glasses   . Wears partial dentures    upper and lower    Past Surgical History:  Procedure Laterality Date  . COLONOSCOPY  per pt scheduled 05-06-2020  . DILATATION & CURETTAGE/HYSTEROSCOPY WITH MYOSURE N/A 05/09/2020   Procedure: DILATATION & CURETTAGE/HYSTEROSCOPY WITH MYOSURE  RESECTION OF ENDOMETRIAL MASS;  Surgeon: Megan Salon, MD;  Location: [facility];  Service: Gynecology;  Laterality: N/A;  polyp resection  . TUBAL LIGATION Bilateral 1980s    There were no vitals filed for this visit.    Subjective Assessment - 11/01/20 0852    Subjective Patient reports that she slipped and fell striking inside of Lt knee and Rt arm/shoulder 09/29/20. She has continued discomfort Rt shoulder girdle area.    Pertinent History hx of Lt knee pain     Currently in Pain? Yes    Pain Score 5    end of the day 7/10   Pain Location Neck    Pain Orientation Right    Pain Descriptors / Indicators Burning;Aching;Tingling   grabbing   Pain Type Acute pain    Pain Radiating Towards upper trap into the Rt shoulder numbness into the Rt UE    Pain Onset More than a month ago    Pain Frequency Intermittent    Aggravating Factors  computer work; sleeping; standing for any length of time; using UE;s    Pain Relieving Factors meds; movement              [facility] PT Assessment - 11/01/20 0001      Assessment   Medical Diagnosis Cervicalgia; Lt knee pain    Referring Provider (PT) Cline Crock, PA-C    Onset Date/Surgical Date 09/29/20    Hand Dominance Right    Next MD Visit none scheduled    Prior Therapy none      Precautions   Precautions None      Restrictions   Weight Bearing Restrictions No      Balance Screen   Has the patient fallen in the past 6 months Yes    How many times? 1  Has the patient had a decrease in activity level because of a fear of falling?  No    Is the patient reluctant to leave their home because of a fear of falling?  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    Leisure household chores; cooking      Observation/Other Assessments   Focus on Therapeutic Outcomes (FOTO)  35      Sensation   Additional Comments intermittent tingling in whole hand on an intermittent basis      Posture/Postural Control   Posture Comments head forward; shouders rounded and elevated; head of the humerus anterior in orientation; sits with head in flexion chin dropped to chest      AROM   Overall AROM Comments tightness and tingling with Rt shoulder and neck ROM    Right Shoulder Flexion 106 Degrees    Right Shoulder ABduction 82 Degrees    Right Shoulder Internal Rotation --    thumb lateral hip   Left Shoulder Flexion 127 Degrees    Left Shoulder ABduction 112 Degrees    Left Shoulder Internal Rotation --   thumb T10/11   Left Shoulder External Rotation 80 Degrees    Cervical Flexion 48 tingling Rt upper trap ear to shd    Cervical Extension 0    Cervical - Right Side Bend 19    Cervical - Left Side Bend 13    Cervical - Right Rotation 24    Cervical - Left Rotation 22      Strength   Overall Strength Comments WFL's bilat UE's - UE's at side      Palpation   Spinal mobility hypomobile thoracic and cervical - pain with PA mobs    Palpation comment significant muscular tightness through Rt > Lt ant/lat/post cervical musculature; cervical and thoracic paraspinals; pecs; upper traps; periscapular musculature      Special Tests   Other special tests (+) neural tension Rt > Lt UE                      Objective measurements completed on examination: See above findings.       [facility] Adult PT Treatment/Exercise - 11/01/20 0001      Self-Care   Self-Care Other Self-Care Comments    Other Self-Care Comments  ergonomic correction for desk/computer      Neuro Re-ed    Neuro Re-ed Details  postural education      Shoulder Exercises: Seated   Other Seated Exercises chest lift; chin tuck; cervical ROM; posterior shd rolls      Shoulder Exercises: Standing   Other Standing Exercises scap squeeze; axial extension; lateral cervical flexion; cervical rotation; posterior shoulder rolls with noodle      Moist Heat Therapy   Number Minutes Moist Heat 15 Minutes    Moist Heat Location Cervical      Electrical Stimulation   Electrical Stimulation Location Rt cervical to shoulder    Electrical Stimulation Action TENS    Electrical Stimulation Parameters to tolerance    Electrical Stimulation Goals Pain;Tone                  PT Education - 11/01/20 0939    Education Details HEP POC TENS    Person(s) Educated Patient    Methods  Explanation;Demonstration;Tactile cues;Verbal cues;Handout    Comprehension  Verbalized understanding;Returned demonstration;Verbal cues required;Tactile cues required               PT Long Term Goals - 11/01/20 1006      PT LONG TERM GOAL #1   Title Improve posture and alignment with patient to demonstrate improved upright posture of head and neck with posterior shoulder girdle engaged    Time 6    Period Weeks    Status New    Target Date 12/13/20      PT LONG TERM GOAL #2   Title Increased cervical extension; lateral flexion; rotation by 10-15 degrees or more in all planes    Time 6    Period Weeks    Status New    Target Date 12/13/20      PT LONG TERM GOAL #3   Title Decrease pain with functional activities and sleeping by 50-75% allowing patient to live more normally    Time 6    Period Weeks    Status New    Target Date 12/13/20      PT LONG TERM GOAL #4   Title Independent in HEP    Time 6    Period Weeks    Status New    Target Date 12/13/20      PT LONG TERM GOAL #5   Title Improve functional limitatioin score to 51    Time 6    Period Weeks    Status New    Target Date 12/13/20                  Plan - 11/01/20 0956    Clinical Impression Statement Patient presents with cervical pain and dysfunction folloiing a fall 09/29/20. She has poor posture and alignment; limited functional abilities; decreased ROM; significant muscular tightness cervical and shoulder girdle musculature Rt > Lt; intermittent tingling to hand Rt > Lt. Patient has poor postural measures when trying to reduce pain. She works at a [employer] which places her in a forward head positins especially now that she is proping her Lt knee under her desk. She will benefit from PT to address problems identified.    Personal Factors and Comorbidities Comorbidity 1;Profession    Comorbidities HTN; sedentary desk job; injury Lt knee    Examination-Activity Limitations  Carry;Lift;Locomotion Level;Sit;Sleep;Stand    Examination-Participation Restrictions Cleaning;Laundry;Occupation    Stability/Clinical Decision Making Evolving/Moderate complexity    Clinical Decision Making Moderate    Rehab Potential Good    PT Frequency 2x / week    PT Duration 6 weeks    PT Treatment/Interventions ADLs/Self Care Home Management;Aquatic Therapy;Cryotherapy;Electrical Stimulation;Iontophoresis 4mg /ml Dexamethasone;Moist Heat;Ultrasound;Functional mobility training;Therapeutic activities;Therapeutic exercise;Neuromuscular re-education;Patient/family education;Manual techniques;Passive range of motion;Dry needling;Taping    PT Next Visit Plan review HEP; progress with postural correction and ROM exercises; trial of DN/manual work; Proofreader; discussion of sleeping positions; modalities as indicated    PT Home Exercise Plan [email or web address]    Consulted and Agree with Plan of Care Patient           Patient will benefit from skilled therapeutic intervention in order to improve the following deficits and impairments:  Abnormal gait,Decreased range of motion,Increased fascial restricitons,Impaired UE functional use,Decreased activity tolerance,Pain,Hypomobility,Impaired flexibility,Improper body mechanics,Decreased mobility,Postural dysfunction  Visit Diagnosis: Cervicalgia  Abnormal posture  Other symptoms and signs involving the musculoskeletal system     Problem List Patient Active Problem List   Diagnosis Date Noted  . Chronic pain of both knees 02/25/2020  . Urinary incontinence 02/19/2019  .  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 Allergic rhinitis 11/10/2015  . Asthma 11/10/2015  . CLAUDICATION, INTERMITTENT 06/12/2010  . ANEMIA-IRON DEFICIENCY 07/20/2008  . Obesity 07/19/2008  . HYPERTENSION, BENIGN, MILD 07/19/2008    Abijah Roussel Nilda Simmer PT, MPH  11/01/2020, 10:10 AM  [facility] [place], [place], [address] Phone: [phone]   Fax:  [phone]  Name: MELIS TROCHEZ MRN: [id number] Date of Birth: [birth year]

## 2020-11-04 ENCOUNTER — Ambulatory Visit (INDEPENDENT_AMBULATORY_CARE_PROVIDER_SITE_OTHER): Payer: PRIVATE HEALTH INSURANCE | Admitting: Physical Therapy

## 2020-11-04 ENCOUNTER — Other Ambulatory Visit: Payer: Self-pay

## 2020-11-04 DIAGNOSIS — M542 Cervicalgia: Secondary | ICD-10-CM

## 2020-11-04 DIAGNOSIS — R29898 Other symptoms and signs involving the musculoskeletal system: Secondary | ICD-10-CM | POA: Diagnosis not present

## 2020-11-04 DIAGNOSIS — R293 Abnormal posture: Secondary | ICD-10-CM

## 2020-11-04 NOTE — Therapy (Signed)
[place], [place], [address] Phone: [phone]   Fax:  [phone]  Physical Therapy Treatment  Patient Details  Name: Lacey Barron MRN: [id number] Date of Birth: [birth year] Referring Provider (PT): Cline Crock, [place]   Encounter Date: 11/04/2020   PT End of Session - 11/04/20 0836    Visit Number 2    Number of Visits 12    Date for PT Re-Evaluation 12/13/20    PT Start Time 0803    PT Stop Time 0845    PT Time Calculation (min) 42 min    Activity Tolerance Patient limited by pain    Behavior During Therapy [facility] for tasks assessed/performed           Past Medical History:  Diagnosis Date  . Chronic knee pain    bilateral   . Complication of anesthesia    hard to wake  . GERD (gastroesophageal reflux disease)   . History of abnormal cervical Pap smear 2019  . Hypertension    followed by pcp  . IDA (iron deficiency anemia)   . Mild intermittent asthma    followed by pcp  . OA (osteoarthritis)    shoulders and knees  . PONV (postoperative nausea and vomiting)   . Sickle cell trait ([place])   . SUI (stress urinary incontinence, female)   . Uterine fibroid   . Wears glasses   . Wears partial dentures    upper and lower    Past Surgical History:  Procedure Laterality Date  . COLONOSCOPY  per pt scheduled 05-06-2020  . DILATATION & CURETTAGE/HYSTEROSCOPY WITH MYOSURE N/A 05/09/2020   Procedure: DILATATION & CURETTAGE/HYSTEROSCOPY WITH MYOSURE  RESECTION OF ENDOMETRIAL MASS;  Surgeon: Megan Salon, MD;  Location: [facility];  Service: Gynecology;  Laterality: N/A;  polyp resection  . TUBAL LIGATION Bilateral 1980s    There were no vitals filed for this visit.   Subjective Assessment - 11/04/20 0809    Subjective "I've had 2 bad days".  Pt reports she has been practicing better posture and exercises.  She states the prednisone took away the constant  pain.  Pain has affected her sleep; waking pain every 3 hrs. Pt ordered TENS and awaits its arrival.    Currently in Pain? Yes    Pain Score 5     Pain Location Neck    Pain Orientation Right    Pain Descriptors / Indicators Aching    Multiple Pain Sites Yes    Pain Score 5    Pain Location Knee    Pain Orientation Left    Pain Descriptors / Indicators Aching              [facility] PT Assessment - 11/04/20 0001      Assessment   Medical Diagnosis Cervicalgia; Lt knee pain    Referring Provider (PT) Cline Crock, PA-C    Onset Date/Surgical Date 09/29/20    Hand Dominance Right    Next MD Visit none scheduled    Prior Therapy none      AROM   Cervical - Right Rotation 40    Cervical - Left Rotation 40            [facility] Adult PT Treatment/Exercise - 11/04/20 0001      Neck Exercises: Seated   Neck Retraction 10 reps;3 secs    Cervical Rotation Right;Left;5 reps    Lateral Flexion Right;Left;5 reps    Shoulder  Rolls Backwards;10 reps   limited   Other Seated Exercise scap retraction x 5 sec x 10 reps    Other Seated Exercise cervical flexion/ ext x 5 reps each direction.      Shoulder Exercises: ROM/Strengthening   Nustep L3: 4 min (slow pace) LUE/ BLE, unable to tolerate RUE motion.      Moist Heat Therapy   Number Minutes Moist Heat 10 Minutes    Moist Heat Location Cervical   thoracic     Electrical Stimulation   Electrical Stimulation Location bilat cervical paraspinals/ upper trap    Electrical Stimulation Action IFC    Electrical Stimulation Parameters 10 min, intensity to tolerance    Electrical Stimulation Goals Pain                       PT Long Term Goals - 11/01/20 1006      PT LONG TERM GOAL #1   Title Improve posture and alignment with patient to demonstrate improved upright posture of head and neck with posterior shoulder girdle engaged    Time 6    Period Weeks    Status New    Target Date 12/13/20      PT LONG TERM GOAL  #2   Title Increased cervical extension; lateral flexion; rotation by 10-15 degrees or more in all planes    Time 6    Period Weeks    Status New    Target Date 12/13/20      PT LONG TERM GOAL #3   Title Decrease pain with functional activities and sleeping by 50-75% allowing patient to live more normally    Time 6    Period Weeks    Status New    Target Date 12/13/20      PT LONG TERM GOAL #4   Title Independent in HEP    Time 6    Period Weeks    Status New    Target Date 12/13/20      PT LONG TERM GOAL #5   Title Improve functional limitatioin score to 51    Time 6    Period Weeks    Status New    Target Date 12/13/20                 Plan - 11/04/20 0836    Clinical Impression Statement Cervical rotation AROM improved from eval.  Muscles tremulous at available end range with cervical AROM in all directions.  Continued limited exercise tolerance due to increased pain.  Pt reported reduction of pain with use of estim/ MHP at end of session.    Personal Factors and Comorbidities Comorbidity 1;Profession    Comorbidities HTN; sedentary desk job; injury Lt knee    Examination-Activity Limitations Carry;Lift;Locomotion Level;Sit;Sleep;Stand    Examination-Participation Restrictions Cleaning;Laundry;Occupation    Stability/Clinical Decision Making Evolving/Moderate complexity    Rehab Potential Good    PT Frequency 2x / week    PT Duration 6 weeks    PT Treatment/Interventions ADLs/Self Care Home Management;Aquatic Therapy;Cryotherapy;Electrical Stimulation;Iontophoresis 4mg /ml Dexamethasone;Moist Heat;Ultrasound;Functional mobility training;Therapeutic activities;Therapeutic exercise;Neuromuscular re-education;Patient/family education;Manual techniques;Passive range of motion;Dry needling;Taping    PT Next Visit Plan progress with postural correction and ROM exercises; trial of DN/manual work; ergonomic education; discussion of sleeping positions; modalities as indicated;  assess Lt knee (if referral received)    PT Home Exercise Plan [email or web address]    Consulted and Agree with Plan of Care Patient  Patient will benefit from skilled therapeutic intervention in order to improve the following deficits and impairments:  Abnormal gait,Decreased range of motion,Increased fascial restricitons,Impaired UE functional use,Decreased activity tolerance,Pain,Hypomobility,Impaired flexibility,Improper body mechanics,Decreased mobility,Postural dysfunction  Visit Diagnosis: Cervicalgia  Abnormal posture  Other symptoms and signs involving the musculoskeletal system     Problem List Patient Active Problem List   Diagnosis Date Noted  . Chronic pain of both knees 02/25/2020  . Urinary incontinence 02/19/2019  . 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 Allergic rhinitis 11/10/2015  . Asthma 11/10/2015  . CLAUDICATION, INTERMITTENT 06/12/2010  . ANEMIA-IRON DEFICIENCY 07/20/2008  . Obesity 07/19/2008  . HYPERTENSION, BENIGN, MILD 07/19/2008   Kerin Perna, PTA 11/04/20 8:42 AM  [place], [place], [address] Phone: [phone]   Fax:  [phone]  Name: Lacey Barron MRN: [id number] Date of Birth: 07/20/1956

## 2020-11-09 ENCOUNTER — Ambulatory Visit (INDEPENDENT_AMBULATORY_CARE_PROVIDER_SITE_OTHER): Payer: PRIVATE HEALTH INSURANCE | Admitting: Rehabilitative and Restorative Service Providers"

## 2020-11-09 ENCOUNTER — Encounter: Payer: Self-pay | Admitting: Rehabilitative and Restorative Service Providers"

## 2020-11-09 ENCOUNTER — Other Ambulatory Visit: Payer: Self-pay

## 2020-11-09 DIAGNOSIS — R293 Abnormal posture: Secondary | ICD-10-CM | POA: Diagnosis not present

## 2020-11-09 DIAGNOSIS — R29898 Other symptoms and signs involving the musculoskeletal system: Secondary | ICD-10-CM | POA: Diagnosis not present

## 2020-11-09 DIAGNOSIS — M542 Cervicalgia: Secondary | ICD-10-CM | POA: Diagnosis not present

## 2020-11-09 NOTE — Therapy (Signed)
[place], [place], [address] Phone: [phone]   Fax:  [phone]  Physical Therapy Treatment  Patient Details  Name: Lacey Barron MRN: [id number] Date of Birth: 09-11-1955 Referring Provider (PT): Cline Crock, [place]   Encounter Date: 11/09/2020   PT End of Session - 11/09/20 0808    Visit Number 3    Number of Visits 12    Date for PT Re-Evaluation 12/13/20    PT Start Time 0803    PT Stop Time 0851    PT Time Calculation (min) 48 min    Activity Tolerance Patient limited by pain;Patient tolerated treatment well           Past Medical History:  Diagnosis Date  . Chronic knee pain    bilateral   . Complication of anesthesia    hard to wake  . GERD (gastroesophageal reflux disease)   . History of abnormal cervical Pap smear 2019  . Hypertension    followed by pcp  . IDA (iron deficiency anemia)   . Mild intermittent asthma    followed by pcp  . OA (osteoarthritis)    shoulders and knees  . PONV (postoperative nausea and vomiting)   . Sickle cell trait ([place])   . SUI (stress urinary incontinence, female)   . Uterine fibroid   . Wears glasses   . Wears partial dentures    upper and lower    Past Surgical History:  Procedure Laterality Date  . COLONOSCOPY  per pt scheduled 05-06-2020  . DILATATION & CURETTAGE/HYSTEROSCOPY WITH MYOSURE N/A 05/09/2020   Procedure: DILATATION & CURETTAGE/HYSTEROSCOPY WITH MYOSURE  RESECTION OF ENDOMETRIAL MASS;  Surgeon: Megan Salon, MD;  Location: [facility];  Service: Gynecology;  Laterality: N/A;  polyp resection  . TUBAL LIGATION Bilateral 1980s    There were no vitals filed for this visit.   Subjective Assessment - 11/09/20 0813    Subjective Patient reports some improvement in pain in the neck. Has a TENS unit which really helps. Working to change desk/computer work station. Tape helped Lt knee feel supported  - noticed less pain    Pertinent History hx of Lt knee pain    Currently in Pain? Yes    Pain Score 5     Pain Location Neck    Pain Orientation Right                             [facility] Adult PT Treatment/Exercise - 11/09/20 0001      Neck Exercises: Seated   Neck Retraction 10 reps;3 secs    Cervical Rotation Right;Left;5 reps    Lateral Flexion Right;Left;5 reps    Shoulder Rolls Backwards;10 reps   limited   Other Seated Exercise scap retraction x 5 sec x 10 reps    Other Seated Exercise cervical flexion/ ext x 5 reps each direction.      Shoulder Exercises: Standing   Other Standing Exercises scap squeeze; axial extension; lateral cervical flexion; cervical rotation; posterior shoulder rolls with noodle      Shoulder Exercises: Stretch   Other Shoulder Stretches doorway stretch 2 positions 15-20 sec x 2 reps each      Moist Heat Therapy   Number Minutes Moist Heat 10 Minutes    Moist Heat Location Cervical   thoracic     Manual Therapy   Manual therapy comments skilled palpation to assess  response to DN/manual work    Soft tissue mobilization STM to pt tolerance bilat cervical and shoulders    Myofascial Release anterior shoulder Rt    Other Manual Therapy TP work to pt tolerance through Rt lateral cervical and upper trap area    Kinesiotex --   J tape Lt knee 2 strips           Trigger Point Dry Needling - 11/09/20 0001    Consent Given? Yes    Education Handout Provided Yes    Other Dry Needling Rt only    Upper Trapezius Response Palpable increased muscle length                PT Education - 11/09/20 0815    Education Details HEP DN    Person(s) Educated Patient    Methods Explanation;Demonstration;Tactile cues;Verbal cues;Handout    Comprehension Verbalized understanding;Returned demonstration;Verbal cues required;Tactile cues required               PT Long Term Goals - 11/01/20 1006      PT LONG TERM GOAL #1   Title  Improve posture and alignment with patient to demonstrate improved upright posture of head and neck with posterior shoulder girdle engaged    Time 6    Period Weeks    Status New    Target Date 12/13/20      PT LONG TERM GOAL #2   Title Increased cervical extension; lateral flexion; rotation by 10-15 degrees or more in all planes    Time 6    Period Weeks    Status New    Target Date 12/13/20      PT LONG TERM GOAL #3   Title Decrease pain with functional activities and sleeping by 50-75% allowing patient to live more normally    Time 6    Period Weeks    Status New    Target Date 12/13/20      PT LONG TERM GOAL #4   Title Independent in HEP    Time 6    Period Weeks    Status New    Target Date 12/13/20      PT LONG TERM GOAL #5   Title Improve functional limitatioin score to 51    Time 6    Period Weeks    Status New    Target Date 12/13/20                 Plan - 11/09/20 0809    Clinical Impression Statement Patient reports decreased pain. She has worked on exercises at home and modified her work station. She is using the TENs unit and has a positive reponse to TENS. Added gentle stretches today. Trial of DN. Continued work on [employer] and alignment; movement; function.    Rehab Potential Good    PT Frequency 2x / week    PT Duration 6 weeks    PT Treatment/Interventions ADLs/Self Care Home Management;Aquatic Therapy;Cryotherapy;Electrical Stimulation;Iontophoresis 4mg /ml Dexamethasone;Moist Heat;Ultrasound;Functional mobility training;Therapeutic activities;Therapeutic exercise;Neuromuscular re-education;Patient/family education;Manual techniques;Passive range of motion;Dry needling;Taping    PT Next Visit Plan progress with postural correction and ROM exercises; assess response to DN/manual work; ergonomic education; discussion of sleeping positions; modalities as indicated; assess Lt knee (if referral received)    PT Home Exercise Plan [email or web address]    Consulted and  Agree with Plan of Care Patient           Patient will benefit from skilled therapeutic intervention in order to improve the following  deficits and impairments:     Visit Diagnosis: Cervicalgia  Abnormal posture  Other symptoms and signs involving the musculoskeletal system     Problem List Patient Active Problem List   Diagnosis Date Noted  . Chronic pain of both knees 02/25/2020  . Urinary incontinence 02/19/2019  . 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 Allergic rhinitis 11/10/2015  . Asthma 11/10/2015  . CLAUDICATION, INTERMITTENT 06/12/2010  . ANEMIA-IRON DEFICIENCY 07/20/2008  . Obesity 07/19/2008  . HYPERTENSION, BENIGN, MILD 07/19/2008    Tytan Sandate Nilda Simmer PT, MPH  11/09/2020, 8:44 AM  [facility] [place], [place], [address] Phone: [phone]   Fax:  [phone]  Name: LUCIANNA OSTLUND MRN: [id number] Date of Birth: May 22, 1956

## 2020-11-09 NOTE — Patient Instructions (Addendum)
Trigger Point Dry Needling  . What is Trigger Point Dry Needling (DN)? o DN is a physical therapy technique used to treat muscle pain and dysfunction. Specifically, DN helps deactivate muscle trigger points (muscle knots).  o A thin filiform needle is used to penetrate the skin and stimulate the underlying trigger point. The goal is for a local twitch response (LTR) to occur and for the trigger point to relax. No medication of any kind is injected during the procedure.   . What Does Trigger Point Dry Needling Feel Like?  o The procedure feels different for each individual patient. Some patients report that they do not actually feel the needle enter the skin and overall the process is not painful. Very mild bleeding may occur. However, many patients feel a deep cramping in the muscle in which the needle was inserted. This is the local twitch response.   Marland Kitchen How Will I feel after the treatment? o Soreness is normal, and the onset of soreness may not occur for a few hours. Typically this soreness does not last longer than two days.  o Bruising is uncommon, however; ice can be used to decrease any possible bruising.  o In rare cases feeling tired or nauseous after the treatment is normal. In addition, your symptoms may get worse before they get better, this period will typically not last longer than 24 hours.   . What Can I do After My Treatment? o Increase your hydration by drinking more water for the next 24 hours. o You may place ice or heat on the areas treated that have become sore, however, do not use heat on inflamed or bruised areas. Heat often brings more relief post needling. o You can continue your regular activities, but vigorous activity is not recommended initially after the treatment for 24 hours. o DN is best combined with other physical therapy such as strengthening, stretching, and other therapies.  .Access Code: [email or web address]: [email or web address]: 03/23/2022Prepared  by: Tyreek Clabo HoltExercises  Seated Scapular Retraction - 2 x daily - 7 x weekly - 1-2 sets - 10 reps - 10 sec hold  Seated Cervical Retraction - 2 x daily - 7 x weekly - 1-2 sets - 5-10 reps - 10 sec hold  Seated Cervical Sidebending AROM - 2 x daily - 7 x weekly - 1 sets - 5 reps - 5-10 sec hold  Seated Cervical Rotation AROM - 2 x daily - 7 x weekly - 1 sets - 5 reps - 2-3 sec hold  Standing Backward Shoulder Rolls - 2 x daily - 7 x weekly - 1 sets - 10 reps - 1-2 sec hold  Doorway Pec Stretch at 60 Degrees Abduction - 3 x daily - 7 x weekly - 3 reps - 1 sets  Doorway Pec Stretch at 90 Degrees Abduction - 3 x daily - 7 x weekly - 3 reps - 1 sets - 30 seconds hold  Doorway Pec Stretch at 120 Degrees Abduction - 3 x daily - 7 x weekly - 3 reps - 1 sets - 30 second hold hold

## 2020-11-11 ENCOUNTER — Ambulatory Visit (INDEPENDENT_AMBULATORY_CARE_PROVIDER_SITE_OTHER): Payer: PRIVATE HEALTH INSURANCE | Admitting: Rehabilitative and Restorative Service Providers"

## 2020-11-11 ENCOUNTER — Encounter: Payer: Self-pay | Admitting: Rehabilitative and Restorative Service Providers"

## 2020-11-11 ENCOUNTER — Other Ambulatory Visit: Payer: Self-pay

## 2020-11-11 DIAGNOSIS — M542 Cervicalgia: Secondary | ICD-10-CM | POA: Diagnosis not present

## 2020-11-11 DIAGNOSIS — R29898 Other symptoms and signs involving the musculoskeletal system: Secondary | ICD-10-CM | POA: Diagnosis not present

## 2020-11-11 DIAGNOSIS — R293 Abnormal posture: Secondary | ICD-10-CM | POA: Diagnosis not present

## 2020-11-11 NOTE — Therapy (Signed)
[place], [place], [address] Phone: [phone]   Fax:  [phone]  Physical Therapy Treatment  Patient Details  Name: Lacey Barron MRN: [id number] Date of Birth: 12-26-1955 Referring Provider (PT): Cline Crock, [place]   Encounter Date: 11/11/2020   PT End of Session - 11/11/20 0846    Visit Number 4    Number of Visits 12    Date for PT Re-Evaluation 12/13/20    PT Start Time 0845    PT Stop Time 0934    PT Time Calculation (min) 49 min    Activity Tolerance Patient limited by pain;Patient tolerated treatment well           Past Medical History:  Diagnosis Date  . Chronic knee pain    bilateral   . Complication of anesthesia    hard to wake  . GERD (gastroesophageal reflux disease)   . History of abnormal cervical Pap smear 2019  . Hypertension    followed by pcp  . IDA (iron deficiency anemia)   . Mild intermittent asthma    followed by pcp  . OA (osteoarthritis)    shoulders and knees  . PONV (postoperative nausea and vomiting)   . Sickle cell trait ([place])   . SUI (stress urinary incontinence, female)   . Uterine fibroid   . Wears glasses   . Wears partial dentures    upper and lower    Past Surgical History:  Procedure Laterality Date  . COLONOSCOPY  per pt scheduled 05-06-2020  . DILATATION & CURETTAGE/HYSTEROSCOPY WITH MYOSURE N/A 05/09/2020   Procedure: DILATATION & CURETTAGE/HYSTEROSCOPY WITH MYOSURE  RESECTION OF ENDOMETRIAL MASS;  Surgeon: Megan Salon, MD;  Location: [facility];  Service: Gynecology;  Laterality: N/A;  polyp resection  . TUBAL LIGATION Bilateral 1980s    There were no vitals filed for this visit.   Subjective Assessment - 11/11/20 0846    Subjective needling helped through the shoulder but not the neck  - better than she was last week    Currently in Pain? Yes    Pain Score 4     Pain Location Neck    Pain  Orientation Right    Pain Descriptors / Indicators Aching                             [facility] Adult PT Treatment/Exercise - 11/11/20 0001      Neck Exercises: Seated   Neck Retraction 10 reps;3 secs    Cervical Rotation Right;Left;5 reps    Lateral Flexion Right;Left;5 reps    Shoulder Rolls Backwards;10 reps   limited     Shoulder Exercises: Standing   Row Strengthening;Both;10 reps;Theraband    Theraband Level (Shoulder Row) Level 1 (Yellow)    Retraction Strengthening;Both;10 reps;Theraband    Theraband Level (Shoulder Retraction) Level 1 (Yellow)    Other Standing Exercises scap squeeze; axial extension; lateral cervical flexion; cervical rotation; posterior shoulder rolls with noodle      Shoulder Exercises: ROM/Strengthening   UBE (Upper Arm Bike) L1 x 2 min 1 min fwd/1 back      Shoulder Exercises: Stretch   Other Shoulder Stretches doorway stretch lower position 15-20 sec x 2 reps each      Moist Heat Therapy   Number Minutes Moist Heat 10 Minutes    Moist Heat Location Cervical   thoracic     Manual Therapy  Manual therapy comments skilled palpation to assess response to DN/manual work    Soft tissue mobilization STM to pt tolerance bilat cervical and shoulders    Myofascial Release anterior shoulder Rt    Other Manual Therapy to pt tolerance through Rt > Lt lateral cervical and upper trap area    Kinesiotex --   J tape Lt knee 2 strips           Trigger Point Dry Needling - 11/11/20 0001    Consent Given? Yes    Education Handout Provided Previously provided    Other Dry Needling bilat    Upper Trapezius Response Palpable increased muscle length                PT Education - 11/11/20 0904    Education Details HEP    Person(s) Educated Patient    Methods Explanation;Demonstration;Tactile cues;Verbal cues;Handout    Comprehension Verbalized understanding;Returned demonstration;Verbal cues required;Tactile cues required                PT Long Term Goals - 11/01/20 1006      PT LONG TERM GOAL #1   Title Improve posture and alignment with patient to demonstrate improved upright posture of head and neck with posterior shoulder girdle engaged    Time 6    Period Weeks    Status New    Target Date 12/13/20      PT LONG TERM GOAL #2   Title Increased cervical extension; lateral flexion; rotation by 10-15 degrees or more in all planes    Time 6    Period Weeks    Status New    Target Date 12/13/20      PT LONG TERM GOAL #3   Title Decrease pain with functional activities and sleeping by 50-75% allowing patient to live more normally    Time 6    Period Weeks    Status New    Target Date 12/13/20      PT LONG TERM GOAL #4   Title Independent in HEP    Time 6    Period Weeks    Status New    Target Date 12/13/20      PT LONG TERM GOAL #5   Title Improve functional limitatioin score to 51    Time 6    Period Weeks    Status New    Target Date 12/13/20                 Plan - 11/11/20 0854    Clinical Impression Statement Patient reports improvement overall - still has pain in the neck, shoulder, Lt knee. Tolerates DN fairly well through shoulder/trap area not in cervical area. Added TB exercises for posterior shoulder girdle    Rehab Potential Good    PT Frequency 2x / week    PT Duration 6 weeks    PT Treatment/Interventions ADLs/Self Care Home Management;Aquatic Therapy;Cryotherapy;Electrical Stimulation;Iontophoresis 4mg /ml Dexamethasone;Moist Heat;Ultrasound;Functional mobility training;Therapeutic activities;Therapeutic exercise;Neuromuscular re-education;Patient/family education;Manual techniques;Passive range of motion;Dry needling;Taping    PT Next Visit Plan progress with postural correction and ROM exercises; assess response to DN/manual work; ergonomic education; discussion of sleeping positions; modalities as indicated; assess Lt knee (if referral received)    PT Home Exercise Plan  [email or web address]    Consulted and Agree with Plan of Care Patient           Patient will benefit from skilled therapeutic intervention in order to improve the following deficits and impairments:  Visit Diagnosis: Cervicalgia  Abnormal posture  Other symptoms and signs involving the musculoskeletal system     Problem List Patient Active Problem List   Diagnosis Date Noted  . Chronic pain of both knees 02/25/2020  . Urinary incontinence 02/19/2019  . 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 Allergic rhinitis 11/10/2015  . Asthma 11/10/2015  . CLAUDICATION, INTERMITTENT 06/12/2010  . ANEMIA-IRON DEFICIENCY 07/20/2008  . Obesity 07/19/2008  . HYPERTENSION, BENIGN, MILD 07/19/2008    Jarrell Armond Nilda Simmer PT, MPH  11/11/2020, 9:30 AM  [facility] [place], [place], [address] Phone: [phone]   Fax:  [phone]  Name: Lacey Barron MRN: [id number] Date of Birth: 02-10-1956

## 2020-11-11 NOTE — Patient Instructions (Addendum)
Kinesiology tape What is kinesiology tape?  There are many brands of kinesiology tape.  KTape, [employer], [employer], Dynamic tape, to name a few. It is an elasticized tape designed to support the body's natural healing process. This tape provides stability and support to muscles and joints without restricting motion. It can also help decrease swelling in the area of application. How does it work? The tape microscopically lifts and decompresses the skin to allow for drainage of lymph (swelling) to flow away from area, reducing inflammation.  The tape has the ability to help re-educate the neuromuscular system by targeting specific receptors in the skin.  The presence of the tape increases the body's awareness of posture and body mechanics.  Do not use with: . Open wounds . Skin lesions . Adhesive allergies Safe removal of the tape: In some rare cases, mild/moderate skin irritation can occur.  This can include redness, itchiness, or hives. If this occurs, immediately remove tape and consult your primary care physician if symptoms are severe or do not resolve within 2 days.  To remove tape safely, hold nearby skin with one hand and gentle roll tape down with other hand.  You can apply oil or conditioner to tape while in shower prior to removal to loosen adhesive.  DO NOT swiftly rip tape off like a band-aid, as this could cause skin tears and additional skin irritation.   Access Code: [email or web address]: [email or web address]: 03/25/2022Prepared by: Jourdin Gens HoltExercises  Seated Scapular Retraction - 2 x daily - 7 x weekly - 1-2 sets - 10 reps - 10 sec hold  Seated Cervical Retraction - 2 x daily - 7 x weekly - 1-2 sets - 5-10 reps - 10 sec hold  Seated Cervical Sidebending AROM - 2 x daily - 7 x weekly - 1 sets - 5 reps - 5-10 sec hold  Seated Cervical Rotation AROM - 2 x daily - 7 x weekly - 1 sets - 5 reps - 2-3 sec hold  Standing Backward Shoulder Rolls - 2 x daily - 7 x weekly - 1 sets  - 10 reps - 1-2 sec hold  Doorway Pec Stretch at 60 Degrees Abduction - 3 x daily - 7 x weekly - 3 reps - 1 sets  Doorway Pec Stretch at 90 Degrees Abduction - 3 x daily - 7 x weekly - 3 reps - 1 sets - 30 seconds hold  Doorway Pec Stretch at 120 Degrees Abduction - 3 x daily - 7 x weekly - 3 reps - 1 sets - 30 second hold hold  Standing Shoulder External Rotation with Resistance - 2 x daily - 7 x weekly - 1-3 sets - 10 reps - 2-3 sec hold  Standing Bilateral Low Shoulder Row with Anchored Resistance - 2 x daily - 7 x weekly - 1-3 sets - 10 reps - 2-3 sec hold

## 2020-11-15 ENCOUNTER — Encounter: Payer: Self-pay | Admitting: Physical Therapy

## 2020-11-15 ENCOUNTER — Ambulatory Visit (INDEPENDENT_AMBULATORY_CARE_PROVIDER_SITE_OTHER): Payer: PRIVATE HEALTH INSURANCE | Admitting: Physical Therapy

## 2020-11-15 ENCOUNTER — Other Ambulatory Visit: Payer: Self-pay

## 2020-11-15 DIAGNOSIS — M542 Cervicalgia: Secondary | ICD-10-CM | POA: Diagnosis not present

## 2020-11-15 DIAGNOSIS — R293 Abnormal posture: Secondary | ICD-10-CM

## 2020-11-15 DIAGNOSIS — R29898 Other symptoms and signs involving the musculoskeletal system: Secondary | ICD-10-CM

## 2020-11-15 NOTE — Therapy (Signed)
[place], [place], [address] Phone: [phone]   Fax:  [phone]  Physical Therapy Treatment  Patient Details  Name: Lacey Barron MRN: [id number] Date of Birth: 1955/10/06 Referring Provider (PT): Cline Crock, [place]   Encounter Date: 11/15/2020   PT End of Session - 11/15/20 0806    Visit Number 5    Number of Visits 12    Date for PT Re-Evaluation 12/13/20    PT Start Time 0803    PT Stop Time 8768    PT Time Calculation (min) 51 min    Activity Tolerance Patient limited by pain    Behavior During Therapy [facility] for tasks assessed/performed           Past Medical History:  Diagnosis Date  . Chronic knee pain    bilateral   . Complication of anesthesia    hard to wake  . GERD (gastroesophageal reflux disease)   . History of abnormal cervical Pap smear 2019  . Hypertension    followed by pcp  . IDA (iron deficiency anemia)   . Mild intermittent asthma    followed by pcp  . OA (osteoarthritis)    shoulders and knees  . PONV (postoperative nausea and vomiting)   . Sickle cell trait ([place])   . SUI (stress urinary incontinence, female)   . Uterine fibroid   . Wears glasses   . Wears partial dentures    upper and lower    Past Surgical History:  Procedure Laterality Date  . COLONOSCOPY  per pt scheduled 05-06-2020  . DILATATION & CURETTAGE/HYSTEROSCOPY WITH MYOSURE N/A 05/09/2020   Procedure: DILATATION & CURETTAGE/HYSTEROSCOPY WITH MYOSURE  RESECTION OF ENDOMETRIAL MASS;  Surgeon: Megan Salon, MD;  Location: [facility];  Service: Gynecology;  Laterality: N/A;  polyp resection  . TUBAL LIGATION Bilateral 1980s    There were no vitals filed for this visit.   Subjective Assessment - 11/15/20 0807    Subjective "I tried cooking a meal, and then my whole back hurt".  Pt reports she has gotten her desk ergonomics and she sees a difference.  "I'm on the  upswing"    Currently in Pain? Yes    Pain Score 5     Pain Location Neck    Pain Orientation Right    Pain Descriptors / Indicators Tingling    Pain Radiating Towards into Rt shoulder    Aggravating Factors  standing    Pain Relieving Factors medicine, TENS, Tape    Pain Location Knee    Pain Orientation Left;Medial    Pain Descriptors / Indicators Tightness;Shooting              [facility] PT Assessment - 11/15/20 0001      Assessment   Medical Diagnosis Cervicalgia; Lt knee pain    Referring Provider (PT) Cline Crock, PA-C    Onset Date/Surgical Date 09/29/20    Hand Dominance Right    Next MD Visit 11/22/20    Prior Therapy none      AROM   Cervical Flexion 30    Cervical Extension 17    Cervical - Right Side Bend 15    Cervical - Left Side Bend 17    Cervical - Right Rotation 30    Cervical - Left Rotation 20             [facility] Adult PT Treatment/Exercise - 11/15/20 0001  Neck Exercises: Seated   Cervical Rotation Right;Left;5 reps    Lateral Flexion Right;Left;5 reps    Shoulder Rolls Backwards;5 reps      Neck Exercises: Supine   Other Supine Exercise trial of bilat shoulder flexion and horiz abdct AROM x 2 reps (limited motion) - for stretch      Shoulder Exercises: Standing   External Rotation Strengthening;Both;5 reps    Theraband Level (Shoulder External Rotation) Level 1 (Yellow)    Extension Strengthening;Both;5 reps    Row Strengthening;Both;10 reps;Theraband    Theraband Level (Shoulder Row) Level 1 (Yellow)      Shoulder Exercises: ROM/Strengthening   UBE (Upper Arm Bike) L1 x 2 min (1 min fwd/1 back)      Shoulder Exercises: Stretch   Table Stretch - Flexion 5 reps;10 seconds    Other Shoulder Stretches low doorway stretch x 10 sec x 3 reps, bilat bicep stretch at doorway x 15 sec x 2      Moist Heat Therapy   Number Minutes Moist Heat 10 Minutes    Moist Heat Location Cervical      Electrical Stimulation   Electrical  Stimulation Location bilat cervical paraspnals, upper trap    Electrical Stimulation Action IFC    Electrical Stimulation Parameters 10 min, intensity to tolerance    Electrical Stimulation Goals Pain                       PT Long Term Goals - 11/01/20 1006      PT LONG TERM GOAL #1   Title Improve posture and alignment with patient to demonstrate improved upright posture of head and neck with posterior shoulder girdle engaged    Time 6    Period Weeks    Status New    Target Date 12/13/20      PT LONG TERM GOAL #2   Title Increased cervical extension; lateral flexion; rotation by 10-15 degrees or more in all planes    Time 6    Period Weeks    Status New    Target Date 12/13/20      PT LONG TERM GOAL #3   Title Decrease pain with functional activities and sleeping by 50-75% allowing patient to live more normally    Time 6    Period Weeks    Status New    Target Date 12/13/20      PT LONG TERM GOAL #4   Title Independent in HEP    Time 6    Period Weeks    Status New    Target Date 12/13/20      PT LONG TERM GOAL #5   Title Improve functional limitatioin score to 51    Time 6    Period Weeks    Status New    Target Date 12/13/20                 Plan - 11/15/20 0848    Clinical Impression Statement Limited tolerance for UBE today; reported increased tingling in bilat upper trap afterwards.  Improved cervical flexion ROM from eval, but continued limitation in all other directions.  Pt reported reduced tightness in neck after seated/supine shoulder flexion stretch.  Pt progressing gradually towards LTGs.    Rehab Potential Good    PT Frequency 2x / week    PT Duration 6 weeks    PT Treatment/Interventions ADLs/Self Care Home Management;Aquatic Therapy;Cryotherapy;Electrical Stimulation;Iontophoresis 4mg /ml Dexamethasone;Moist Heat;Ultrasound;Functional mobility training;Therapeutic activities;Therapeutic exercise;Neuromuscular  re-education;Patient/family education;Manual  techniques;Passive range of motion;Dry needling;Taping    PT Next Visit Plan progress with postural correction and ROM exercises; assess response to DN/manual work; Proofreader; discussion of sleeping positions; modalities as indicated; assess Lt knee (if referral received)    PT Home Exercise Plan [email or web address]    Consulted and Agree with Plan of Care Patient           Patient will benefit from skilled therapeutic intervention in order to improve the following deficits and impairments:  Abnormal gait,Decreased range of motion,Increased fascial restricitons,Impaired UE functional use,Decreased activity tolerance,Pain,Hypomobility,Impaired flexibility,Improper body mechanics,Decreased mobility,Postural dysfunction  Visit Diagnosis: Cervicalgia  Abnormal posture  Other symptoms and signs involving the musculoskeletal system     Problem List Patient Active Problem List   Diagnosis Date Noted  . Chronic pain of both knees 02/25/2020  . Urinary incontinence 02/19/2019  . 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 Allergic rhinitis 11/10/2015  . Asthma 11/10/2015  . CLAUDICATION, INTERMITTENT 06/12/2010  . ANEMIA-IRON DEFICIENCY 07/20/2008  . Obesity 07/19/2008  . HYPERTENSION, BENIGN, MILD 07/19/2008   Kerin Perna, PTA 11/15/20 11:25 AM  [place], [place], [address] Phone: [phone]   Fax:  [phone]  Name: NESREEN ALBANO MRN: [id number] Date of Birth: [birth year]

## 2020-11-17 ENCOUNTER — Other Ambulatory Visit: Payer: Self-pay

## 2020-11-17 ENCOUNTER — Encounter: Payer: Self-pay | Admitting: Rehabilitative and Restorative Service Providers"

## 2020-11-17 ENCOUNTER — Ambulatory Visit (INDEPENDENT_AMBULATORY_CARE_PROVIDER_SITE_OTHER): Payer: PRIVATE HEALTH INSURANCE | Admitting: Rehabilitative and Restorative Service Providers"

## 2020-11-17 DIAGNOSIS — R29898 Other symptoms and signs involving the musculoskeletal system: Secondary | ICD-10-CM | POA: Diagnosis not present

## 2020-11-17 DIAGNOSIS — M542 Cervicalgia: Secondary | ICD-10-CM | POA: Diagnosis not present

## 2020-11-17 DIAGNOSIS — R293 Abnormal posture: Secondary | ICD-10-CM
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
## 2020-11-19 ENCOUNTER — Other Ambulatory Visit (HOSPITAL_COMMUNITY): Payer: Self-pay

## 2020-11-21 ENCOUNTER — Ambulatory Visit (INDEPENDENT_AMBULATORY_CARE_PROVIDER_SITE_OTHER): Payer: PRIVATE HEALTH INSURANCE | Admitting: Rehabilitative and Restorative Service Providers"

## 2020-11-21 ENCOUNTER — Other Ambulatory Visit: Payer: Self-pay

## 2020-11-21 DIAGNOSIS — R293 Abnormal posture: Secondary | ICD-10-CM

## 2020-11-21 DIAGNOSIS — M542 Cervicalgia: Secondary | ICD-10-CM

## 2020-11-21 DIAGNOSIS — R29898 Other symptoms and signs involving the musculoskeletal system: Secondary | ICD-10-CM | POA: Diagnosis not present

## 2020-11-21 NOTE — Therapy (Signed)
[place], [place], [address] Phone: [phone]   Fax:  [phone]  Physical Therapy Treatment  Patient Details  Name: Lacey Barron MRN: [id number] Date of Birth: [birth year] Referring Provider (PT): Cline Crock, [place]   Encounter Date: 11/21/2020   PT End of Session - 11/21/20 0807    Visit Number 7    Number of Visits 12    Date for PT Re-Evaluation 12/13/20    PT Start Time 0803    PT Stop Time 3419    PT Time Calculation (min) 52 min    Activity Tolerance Patient tolerated treatment well           Past Medical History:  Diagnosis Date  . Chronic knee pain    bilateral   . Complication of anesthesia    hard to wake  . GERD (gastroesophageal reflux disease)   . History of abnormal cervical Pap smear 2019  . Hypertension    followed by pcp  . IDA (iron deficiency anemia)   . Mild intermittent asthma    followed by pcp  . OA (osteoarthritis)    shoulders and knees  . PONV (postoperative nausea and vomiting)   . Sickle cell trait ([place])   . SUI (stress urinary incontinence, female)   . Uterine fibroid   . Wears glasses   . Wears partial dentures    upper and lower    Past Surgical History:  Procedure Laterality Date  . COLONOSCOPY  per pt scheduled 05-06-2020  . DILATATION & CURETTAGE/HYSTEROSCOPY WITH MYOSURE N/A 05/09/2020   Procedure: DILATATION & CURETTAGE/HYSTEROSCOPY WITH MYOSURE  RESECTION OF ENDOMETRIAL MASS;  Surgeon: Megan Salon, MD;  Location: [facility];  Service: Gynecology;  Laterality: N/A;  polyp resection  . TUBAL LIGATION Bilateral 1980s    There were no vitals filed for this visit.   Subjective Assessment - 11/21/20 0808    Subjective Making progress. The needles really helped but does not want to do that again now. Still having some numbness in her hands at night mostly. Knee is feeling better even without the tape. Neck feels  the best it has felt.    Currently in Pain? Yes    Pain Score 3     Pain Location Neck    Pain Orientation Right    Pain Descriptors / Indicators Aching;Nagging;Tingling    Pain Type Acute pain              [facility] PT Assessment - 11/21/20 0001      Assessment   Medical Diagnosis Cervicalgia; Lt knee pain    Referring Provider (PT) Cline Crock, PA-C    Onset Date/Surgical Date 09/29/20    Hand Dominance Right    Next MD Visit 11/22/20    Prior Therapy none      Sensation   Additional Comments intermittent tingling bilat UE's into hands on an intermittent basis, most often at night      Posture/Postural Control   Posture Comments head forward; shouders rounded and elevated; head of the humerus anterior in orientation; sits with head in flexion chin dropped to chest      AROM   Overall AROM Comments gradual increasing cervical mobility/ROM      Palpation   Palpation comment significant muscular tightness through Rt > Lt pecs; ant/lat/post cervical musculature; cervical and thoracic paraspinals; upper traps; periscapular musculature  [place] Adult PT Treatment/Exercise - 11/21/20 0001      Self-Care   ADL's worked on sleeping posture and positions with modification of pillows; use of towel for neck alignment; use of pillow to support top most arm when sidelying      Therapeutic Activites    Therapeutic Activities Other Therapeutic Activities      Neuro Re-ed    Neuro Re-ed Details  working on posture and alignment, lifting chest to improve alignment of shoulders      Neck Exercises: Supine   Other Supine Exercise pec self stretch 10-15 sec hold x 3 reps - reproduced tingling into Rt UE      Shoulder Exercises: ROM/Strengthening   UBE (Upper Arm Bike) L2 x 4 min alt fwd/back      Shoulder Exercises: Stretch   Other Shoulder Stretches doorway lower position x 3 reps x 15-20 sec; mid level holding position without stepping in x  10-15 sec x 2; higher position 10 sec x 2 reps holding position without stepping in      Moist Heat Therapy   Number Minutes Moist Heat 10 Minutes    Moist Heat Location Cervical;Knee      Electrical Stimulation   Electrical Stimulation Location bilat cervical paraspnals, Rt pecs    Electrical Stimulation Action TENs    Electrical Stimulation Parameters to tolerance    Electrical Stimulation Goals Pain;Tone      Manual Therapy   Manual therapy comments skilled palpation to assess response to DN/manual work    Soft tissue mobilization STM to pt tolerance bilat cervical and pecs to upper trap    Myofascial Release bilat anterior chest - pecs    Kinesiotex --   tape - Lt knee 1 strip medial knee, 1 patellar area; tape for bilat shds inhibition of upper trap and postural correction for head of the humerus 2 strips each UE           Trigger Point Dry Needling - 11/21/20 0001    Consent Given? Yes    Education Handout Provided Previously provided    Other Dry Needling Rt    Pectoralis Major Response Palpable increased muscle length;Twitch response elicited    Pectoralis Minor Response Palpable increased muscle length;Twitch response elicited                     PT Long Term Goals - 11/01/20 1006      PT LONG TERM GOAL #1   Title Improve posture and alignment with patient to demonstrate improved upright posture of head and neck with posterior shoulder girdle engaged    Time 6    Period Weeks    Status New    Target Date 12/13/20      PT LONG TERM GOAL #2   Title Increased cervical extension; lateral flexion; rotation by 10-15 degrees or more in all planes    Time 6    Period Weeks    Status New    Target Date 12/13/20      PT LONG TERM GOAL #3   Title Decrease pain with functional activities and sleeping by 50-75% allowing patient to live more normally    Time 6    Period Weeks    Status New    Target Date 12/13/20      PT LONG TERM GOAL #4   Title  Independent in HEP    Time 6    Period Weeks    Status New  Target Date 12/13/20      PT LONG TERM GOAL #5   Title Improve functional limitatioin score to 51    Time 6    Period Weeks    Status New    Target Date 12/13/20                 Plan - 11/21/20 0810    Clinical Impression Statement Improving with pt reporting less pain in the neck. She continues to have tingling in both arms at night. Difficulty sleeping with tingling and numbness awakening her at night. Reviewed and worked on sleeping position - noted that tingling occurs when UE falls forward into fetal postion. Patient has palpable tightness in pec major/minor Rt > Lt with reproduction of UE symptoms with work through Customer service manager for pecs. Good releae of tightness with manual work and DN. Pec tightness directly contributes to UE symptoms.    Rehab Potential Good    PT Frequency 2x / week    PT Duration 6 weeks    PT Treatment/Interventions ADLs/Self Care Home Management;Aquatic Therapy;Cryotherapy;Electrical Stimulation;Iontophoresis 4mg /ml Dexamethasone;Moist Heat;Ultrasound;Functional mobility training;Therapeutic activities;Therapeutic exercise;Neuromuscular re-education;Patient/family education;Manual techniques;Passive range of motion;Dry needling;Taping    PT Next Visit Plan progress with postural correction and ROM exercises; continue DN/manual work; ergonomic education; discussion of sleeping positions; modalities as indicated; assess Lt knee (if referral received) assess response to DN in pecs and tape for shoulders    PT Home Exercise Plan [email or web address]    Consulted and Agree with Plan of Care Patient           Patient will benefit from skilled therapeutic intervention in order to improve the following deficits and impairments:     Visit Diagnosis: Cervicalgia  Abnormal posture  Other symptoms and signs involving the musculoskeletal system     Problem List Patient Active Problem List    Diagnosis Date Noted  . Chronic pain of both knees 02/25/2020  . Urinary incontinence 02/19/2019  . 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 Allergic rhinitis 11/10/2015  . Asthma 11/10/2015  . CLAUDICATION, INTERMITTENT 06/12/2010  . ANEMIA-IRON DEFICIENCY 07/20/2008  . Obesity 07/19/2008  . HYPERTENSION, BENIGN, MILD 07/19/2008    Noam Karaffa Nilda Simmer PT, MPH  11/21/2020, 9:06 AM  [facility] [place], [place], [address] Phone: [phone]   Fax:  [phone]  Name: Lacey Barron MRN: [id number] Date of Birth: January 22, 1956

## 2020-11-22 ENCOUNTER — Other Ambulatory Visit (HOSPITAL_COMMUNITY): Payer: Self-pay

## 2020-11-22 MED ORDER — METHOCARBAMOL 500 MG PO TABS
500.0000 mg | ORAL_TABLET | Freq: Three times a day (TID) | ORAL | 1 refills | Status: DC
Start: 2020-11-22 — End: 2022-02-28
  Filled 2020-11-22: qty 40, 13d supply, fill #0
  Filled 2021-07-19: qty 40, 13d supply, fill #1

## 2020-11-22 MED ORDER — PREDNISONE 5 MG PO TABS
5.0000 mg | ORAL_TABLET | ORAL | 0 refills | Status: DC
Start: 2020-11-22 — End: 2022-03-03
  Filled 2020-11-22: qty 48, 12d supply, fill #0

## 2020-11-23 ENCOUNTER — Other Ambulatory Visit (HOSPITAL_COMMUNITY): Payer: Self-pay

## 2020-11-24 ENCOUNTER — Ambulatory Visit (INDEPENDENT_AMBULATORY_CARE_PROVIDER_SITE_OTHER): Payer: PRIVATE HEALTH INSURANCE | Admitting: Rehabilitative and Restorative Service Providers"

## 2020-11-24 ENCOUNTER — Other Ambulatory Visit (HOSPITAL_COMMUNITY): Payer: Self-pay

## 2020-11-24 ENCOUNTER — Encounter: Payer: Self-pay | Admitting: Rehabilitative and Restorative Service Providers"

## 2020-11-24 DIAGNOSIS — M542 Cervicalgia: Secondary | ICD-10-CM

## 2020-11-24 DIAGNOSIS — R293 Abnormal posture: Secondary | ICD-10-CM | POA: Diagnosis not present

## 2020-11-24 DIAGNOSIS — R29898 Other symptoms and signs involving the musculoskeletal system: Secondary | ICD-10-CM | POA: Diagnosis not present

## 2020-11-24 NOTE — Patient Instructions (Signed)
Access Code: [email or web address]: 04/07/2022Prepared by: Anayah Arvanitis HoltExercises  Seated Scapular Retraction - 2 x daily - 7 x weekly - 1-2 sets - 10 reps - 10 sec hold  Seated Cervical Retraction - 2 x daily - 7 x weekly - 1-2 sets - 5-10 reps - 10 sec hold  Seated Cervical Sidebending AROM - 2 x daily - 7 x weekly - 1 sets - 5 reps - 5-10 sec hold  Seated Cervical Rotation AROM - 2 x daily - 7 x weekly - 1 sets - 5 reps - 2-3 sec hold  Standing Backward Shoulder Rolls - 2 x daily - 7 x weekly - 1 sets - 10 reps - 1-2 sec hold  Doorway Pec Stretch at 60 Degrees Abduction - 3 x daily - 7 x weekly - 3 reps - 1 sets  Doorway Pec Stretch at 90 Degrees Abduction - 3 x daily - 7 x weekly - 3 reps - 1 sets - 30 seconds hold  Doorway Pec Stretch at 120 Degrees Abduction - 3 x daily - 7 x weekly - 3 reps - 1 sets - 30 second hold hold  Standing Shoulder External Rotation with Resistance - 2 x daily - 7 x weekly - 1-3 sets - 10 reps - 2-3 sec hold  Standing Bilateral Low Shoulder Row with Anchored Resistance - 2 x daily - 7 x weekly - 1-3 sets - 10 reps - 2-3 sec hold  Seated Single Arm Shoulder Horizontal Abduction and Adduction - 2 x daily - 7 x weekly - 1 sets - 3 reps - 30 sec hold  Standing Shoulder Horizontal Abduction with Bent Arms and Dumbbells - 2 x daily - 7 x weekly - 1 sets - 3 reps - 30 sec hold  Shoulder Horizontal Abduction - Thumbs Up - 2 x daily - 7 x weekly - 1 sets - 3 reps - 30 sec hold  Hooklying Hamstring Stretch with Strap - 2 x daily - 7 x weekly - 1 sets - 3 reps - 30 sec hold  Single Leg Stance - 2 x daily - 7 x weekly - 2 sets - 5 reps - 20 sec hold

## 2020-11-24 NOTE — Therapy (Addendum)
[place], [place], [address] Phone: [phone]   Fax:  [phone]  Physical Therapy Treatment  Patient Details  Name: Lacey Barron MRN: [id number] Date of Birth: 09-10-1955 Referring Provider (PT): Cline Crock, [place]   Encounter Date: 11/24/2020   PT End of Session - 11/24/20 0805    Visit Number 8    Number of Visits 12    Date for PT Re-Evaluation 12/13/20    PT Start Time 0802    PT Stop Time 0852    PT Time Calculation (min) 50 min    Activity Tolerance Patient tolerated treatment well           Past Medical History:  Diagnosis Date  . Chronic knee pain    bilateral   . Complication of anesthesia    hard to wake  . GERD (gastroesophageal reflux disease)   . History of abnormal cervical Pap smear 2019  . Hypertension    followed by pcp  . IDA (iron deficiency anemia)   . Mild intermittent asthma    followed by pcp  . OA (osteoarthritis)    shoulders and knees  . PONV (postoperative nausea and vomiting)   . Sickle cell trait ([place])   . SUI (stress urinary incontinence, female)   . Uterine fibroid   . Wears glasses   . Wears partial dentures    upper and lower    Past Surgical History:  Procedure Laterality Date  . COLONOSCOPY  per pt scheduled 05-06-2020  . DILATATION & CURETTAGE/HYSTEROSCOPY WITH MYOSURE N/A 05/09/2020   Procedure: DILATATION & CURETTAGE/HYSTEROSCOPY WITH MYOSURE  RESECTION OF ENDOMETRIAL MASS;  Surgeon: Megan Salon, MD;  Location: [facility];  Service: Gynecology;  Laterality: N/A;  polyp resection  . TUBAL LIGATION Bilateral 1980s    There were no vitals filed for this visit.   Subjective Assessment - 11/24/20 0806    Subjective Saw MD yesterday and he wants her to continue with therapy and try traction. He will add MRIN at next MD visit if she is not improved. RTD 12/20/20. No treatment for knee at this time.    Currently  in Pain? Yes    Pain Score 2     Pain Location Neck    Pain Orientation Right    Pain Type Acute pain    Pain Score 3    Pain Location Knee    Pain Orientation Left;Medial    Pain Descriptors / Indicators Tightness;Shooting                             [place] Adult PT Treatment/Exercise - 11/24/20 0001      Neck Exercises: Supine   Other Supine Exercise HS stretch with strap supine 30 sec x 2 reps    Other Supine Exercise pec self stretch 10-15 sec hold x 3 reps - reproduced tingling into Rt UE      Shoulder Exercises: Standing   Other Standing Exercises scap squeeze; axial extension; lateral cervical flexion; cervical rotation; posterior shoulder rolls with noodle    Other Standing Exercises shoulder rolls posterior x 10      Shoulder Exercises: ROM/Strengthening   UBE (Upper Arm Bike) L2 x 4 min alt fwd/back      Shoulder Exercises: Stretch   Other Shoulder Stretches doorway lower position x 3 reps x 15-20 sec; mid level holding position without stepping in x 10-15  sec x 2; higher position 10 sec x 2 reps holding position without stepping in    Other Shoulder Stretches T at wall for stretch through the pecs 2030 sec x 3 reps each side      Moist Heat Therapy   Number Minutes Moist Heat 15 Minutes    Moist Heat Location Cervical;Knee      Traction   Type of Traction Cervical    Min (lbs) 14    Max (lbs) 20    Hold Time 30 sec    Rest Time 10 sec    Time 15      Manual Therapy   Manual therapy comments skilled palpation to assess response to DN/manual work    Soft tissue mobilization STM to pt tolerance bilat cervical and pecs to upper trap    Myofascial Release bilat anterior chest - pecs    Manual Traction manual cervical traction 20-30 sec hold x 3 reps    Kinesiotex --   tape - Lt knee 1 strip medial knee, 1 patellar area(pt taping at home); tape for right shd inhibition of upper trap and postural correction for head of the humerus 2 strips each UE      Neck Exercises: Stretches   Upper Trapezius Stretch Right;Left;2 reps;10 seconds            Trigger Point Dry Needling - 11/24/20 0001    Consent Given? Yes    Education Handout Provided Previously provided    Other Dry Needling Rt    Pectoralis Major Response Palpable increased muscle length;Twitch response elicited    Pectoralis Minor Response Palpable increased muscle length;Twitch response elicited                PT Education - 11/24/20 0843    Education Details HEP    Person(s) Educated Patient    Methods Explanation;Demonstration;Tactile cues;Verbal cues;Handout    Comprehension Verbalized understanding;Returned demonstration;Verbal cues required;Tactile cues required               PT Long Term Goals - 11/01/20 1006      PT LONG TERM GOAL #1   Title Improve posture and alignment with patient to demonstrate improved upright posture of head and neck with posterior shoulder girdle engaged    Time 6    Period Weeks    Status New    Target Date 12/13/20      PT LONG TERM GOAL #2   Title Increased cervical extension; lateral flexion; rotation by 10-15 degrees or more in all planes    Time 6    Period Weeks    Status New    Target Date 12/13/20      PT LONG TERM GOAL #3   Title Decrease pain with functional activities and sleeping by 50-75% allowing patient to live more normally    Time 6    Period Weeks    Status New    Target Date 12/13/20      PT LONG TERM GOAL #4   Title Independent in HEP    Time 6    Period Weeks    Status New    Target Date 12/13/20      PT LONG TERM GOAL #5   Title Improve functional limitatioin score to 51    Time 6    Period Weeks    Status New    Target Date 12/13/20                 Plan -  11/24/20 0809    Clinical Impression Statement Some improvement in the tingling at night and she is not waking up as often in the night. Continues to have pain in the shoulder and neck area. Knee hurts but taping helps.  Trial of cervical traction. Unsure of results. Will assess and determine effectiveness. Note significant improvement in muscular tightness through pecs following DN last visit with patient reporting decrease in tingling in UE's at night. She has made efforts to modify sleeping position. Gradual progress continues.    Rehab Potential Good    PT Frequency 2x / week    PT Duration 6 weeks    PT Treatment/Interventions ADLs/Self Care Home Management;Aquatic Therapy;Cryotherapy;Electrical Stimulation;Iontophoresis 4mg /ml Dexamethasone;Moist Heat;Ultrasound;Functional mobility training;Therapeutic activities;Therapeutic exercise;Neuromuscular re-education;Patient/family education;Manual techniques;Passive range of motion;Dry needling;Taping    PT Next Visit Plan progress with postural correction and ROM exercises; continue DN/manual work; ergonomic education; discussion of sleeping positions; modalities as indicated; assess Lt knee (if referral received) assess response to DN in pecs and tape for shoulders. Assess response to mechanical cervical traction.    PT Home Exercise Plan [email or web address]    Consulted and Agree with Plan of Care Patient           Patient will benefit from skilled therapeutic intervention in order to improve the following deficits and impairments:     Visit Diagnosis: Cervicalgia  Abnormal posture  Other symptoms and signs involving the musculoskeletal system     Problem List Patient Active Problem List   Diagnosis Date Noted  . Chronic pain of both knees 02/25/2020  . Urinary incontinence 02/19/2019  . 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 Allergic rhinitis 11/10/2015  . Asthma 11/10/2015  . CLAUDICATION, INTERMITTENT 06/12/2010  . ANEMIA-IRON DEFICIENCY 07/20/2008  . Obesity 07/19/2008  . HYPERTENSION, BENIGN, MILD 07/19/2008    Arin Vanosdol Nilda Simmer PT,MPH  11/24/2020, 9:19 AM  [facility] [place], [place],  [address] Phone: [phone]   Fax:  [phone]  Name: AYELEN SCIORTINO MRN: [id number] Date of Birth: 1956-08-13

## 2020-11-28 ENCOUNTER — Ambulatory Visit (INDEPENDENT_AMBULATORY_CARE_PROVIDER_SITE_OTHER): Payer: PRIVATE HEALTH INSURANCE | Admitting: Physical Therapy

## 2020-11-28 ENCOUNTER — Encounter: Payer: Self-pay | Admitting: Physical Therapy

## 2020-11-28 ENCOUNTER — Other Ambulatory Visit: Payer: Self-pay

## 2020-11-28 DIAGNOSIS — R293 Abnormal posture: Secondary | ICD-10-CM

## 2020-11-28 DIAGNOSIS — R29898 Other symptoms and signs involving the musculoskeletal system: Secondary | ICD-10-CM | POA: Diagnosis not present

## 2020-11-28 DIAGNOSIS — M542 Cervicalgia: Secondary | ICD-10-CM

## 2020-11-28 NOTE — Therapy (Signed)
[place], [place], [address] Phone: [phone]   Fax:  [phone]  Physical Therapy Treatment  Patient Details  Name: Lacey Barron MRN: [id number] Date of Birth: 08/12/1956 Referring Provider (PT): Cline Crock, [place]   Encounter Date: 11/28/2020   PT End of Session - 11/28/20 0849    Visit Number 9    Number of Visits 12    Date for PT Re-Evaluation 12/13/20    Authorization - Visit Number 9    Authorization - Number of Visits 16   16 approved fro work comp   [employer] Time [id number]    PT Stop Time 0931    PT Time Calculation (min) 45 min    Activity Tolerance Patient tolerated treatment well    Behavior During Therapy [facility] for tasks assessed/performed           Past Medical History:  Diagnosis Date  . Chronic knee pain    bilateral   . Complication of anesthesia    hard to wake  . GERD (gastroesophageal reflux disease)   . History of abnormal cervical Pap smear 2019  . Hypertension    followed by pcp  . IDA (iron deficiency anemia)   . Mild intermittent asthma    followed by pcp  . OA (osteoarthritis)    shoulders and knees  . PONV (postoperative nausea and vomiting)   . Sickle cell trait ([place])   . SUI (stress urinary incontinence, female)   . Uterine fibroid   . Wears glasses   . Wears partial dentures    upper and lower    Past Surgical History:  Procedure Laterality Date  . COLONOSCOPY  per pt scheduled 05-06-2020  . DILATATION & CURETTAGE/HYSTEROSCOPY WITH MYOSURE N/A 05/09/2020   Procedure: DILATATION & CURETTAGE/HYSTEROSCOPY WITH MYOSURE  RESECTION OF ENDOMETRIAL MASS;  Surgeon: Megan Salon, MD;  Location: [facility];  Service: Gynecology;  Laterality: N/A;  polyp resection  . TUBAL LIGATION Bilateral 1980s    There were no vitals filed for this visit.   Subjective Assessment - 11/28/20 0922    Subjective "I had the best weekend since  all this happened. I think the traction helped."    Currently in Pain? Yes    Pain Score 2     Pain Location Neck    Pain Descriptors / Indicators Aching;Tightness    Pain Radiating Towards into Rt shoulder    Aggravating Factors  standing    Pain Relieving Factors TENS, heat, medicine              [facility] PT Assessment - 11/28/20 0001      Assessment   Medical Diagnosis Cervicalgia; Lt knee pain    Referring Provider (PT) Cline Crock, PA-C    Onset Date/Surgical Date 09/29/20    Hand Dominance Right    Next MD Visit 12/20/20    Prior Therapy none      AROM   Cervical Extension 15    Cervical - Right Side Bend 17    Cervical - Left Side Bend 15    Cervical - Right Rotation 35    Cervical - Left Rotation 45            [facility] Adult PT Treatment/Exercise - 11/28/20 0001      Neck Exercises: Seated   Cervical Rotation Right;Left;5 reps    Lateral Flexion Right;Left;5 reps    Shoulder Rolls Backwards;5 reps  Shoulder Exercises: Standing   External Rotation Strengthening;10 reps    Theraband Level (Shoulder External Rotation) Level 2 (Red)    Extension AAROM;Both   cane behind back for dynamic stretch   Row Both;10 reps    Theraband Level (Shoulder Row) Level 2 (Red)      Shoulder Exercises: ROM/Strengthening   UBE (Upper Arm Bike) L2: 1 min each direction, standing      Shoulder Exercises: Stretch   Other Shoulder Stretches midlevel doorway stretch x 15 sec x 2 reps. bilat bicep stretch with hands on door frame x 15 sec    Other Shoulder Stretches T at wall for stretch through the pecs 20-30 sec x 1 rep.      Moist Heat Therapy   Number Minutes Moist Heat 15 Minutes   during traction   Moist Heat Location Cervical;Knee      Traction   Type of Traction Cervical    Min (lbs) 14    Max (lbs) 20    Hold Time 30 sec    Rest Time 10 sec    Time 15      Manual Therapy   Soft tissue mobilization IASTM to bilat upper trap and cervical paraspinals to  decrease fascial restrictions and improve ROM.                       PT Long Term Goals - 11/28/20 0926      PT LONG TERM GOAL #1   Title Improve posture and alignment with patient to demonstrate improved upright posture of head and neck with posterior shoulder girdle engaged    Time 6    Period Weeks    Status On-going      PT LONG TERM GOAL #2   Title Increased cervical extension; lateral flexion; rotation by 10-15 degrees or more in all planes    Time 6    Period Weeks    Status Partially Met      PT LONG TERM GOAL #3   Title Decrease pain with functional activities and sleeping by 50-75% allowing patient to live more normally    Time 6    Period Weeks    Status On-going      PT LONG TERM GOAL #4   Title Independent in HEP    Time 6    Period Weeks    Status On-going      PT LONG TERM GOAL #5   Title Improve functional limitatioin score to 51    Time 6    Period Weeks    Status On-going                 Plan - 11/28/20 0916    Clinical Impression Statement Positive response to traction and treatment last session.  Trial of increased resistance with 2 standing shoulder exercises; pt reported increased neck irritation with increased resistance. Pain increased by 1 point during session.  Cervical rotation ROM improved; continued limitation in lateral flexion and ext.  Progressing towards goals.    Rehab Potential Good    PT Frequency 2x / week    PT Duration 6 weeks    PT Treatment/Interventions ADLs/Self Care Home Management;Aquatic Therapy;Cryotherapy;Electrical Stimulation;Iontophoresis 60m/ml Dexamethasone;Moist Heat;Ultrasound;Functional mobility training;Therapeutic activities;Therapeutic exercise;Neuromuscular re-education;Patient/family education;Manual techniques;Passive range of motion;Dry needling;Taping    PT Next Visit Plan progress with postural correction and ROM exercises; continue DN/manual work; ergonomic education;  assess Lt knee (if  referral received) assess response to DN in pecs and  tape for shoulders. Contine to assess response to mechanical cervical traction.    PT Home Exercise Plan [email or web address]    Consulted and Agree with Plan of Care Patient           Patient will benefit from skilled therapeutic intervention in order to improve the following deficits and impairments:  Abnormal gait,Decreased range of motion,Increased fascial restricitons,Impaired UE functional use,Decreased activity tolerance,Pain,Hypomobility,Impaired flexibility,Improper body mechanics,Decreased mobility,Postural dysfunction  Visit Diagnosis: Cervicalgia  Abnormal posture  Other symptoms and signs involving the musculoskeletal system     Problem List Patient Active Problem List   Diagnosis Date Noted  . Chronic pain of both knees 02/25/2020  . Urinary incontinence 02/19/2019  . 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 Allergic rhinitis 11/10/2015  . Asthma 11/10/2015  . CLAUDICATION, INTERMITTENT 06/12/2010  . ANEMIA-IRON DEFICIENCY 07/20/2008  . Obesity 07/19/2008  . HYPERTENSION, BENIGN, MILD 07/19/2008   Kerin Perna, PTA 11/28/20 9:27 AM  [place], [place], [address] Phone: [phone]   Fax:  [phone]  Name: CALIN ELLERY MRN: [id number] Date of Birth: 1956-02-10

## 2020-12-05 ENCOUNTER — Encounter: Payer: Self-pay | Admitting: Physical Therapy

## 2020-12-05 ENCOUNTER — Ambulatory Visit (INDEPENDENT_AMBULATORY_CARE_PROVIDER_SITE_OTHER): Payer: PRIVATE HEALTH INSURANCE | Admitting: Physical Therapy

## 2020-12-05 ENCOUNTER — Other Ambulatory Visit: Payer: Self-pay

## 2020-12-05 DIAGNOSIS — M542 Cervicalgia: Secondary | ICD-10-CM

## 2020-12-05 DIAGNOSIS — R293 Abnormal posture: Secondary | ICD-10-CM | POA: Diagnosis not present

## 2020-12-05 DIAGNOSIS — R29898 Other symptoms and signs involving the musculoskeletal system: Secondary | ICD-10-CM

## 2020-12-05 NOTE — Patient Instructions (Signed)
Access Code: [email or web address] URL: [email or web address] Date: 12/05/2020 Prepared by: Almyra Free  Exercises Seated Scapular Retraction - 2 x daily - 7 x weekly - 1-2 sets - 10 reps - 10 sec hold Seated Cervical Retraction - 2 x daily - 7 x weekly - 1-2 sets - 5-10 reps - 10 sec hold Seated Cervical Sidebending AROM - 2 x daily - 7 x weekly - 1 sets - 5 reps - 5-10 sec hold Seated Cervical Rotation AROM - 2 x daily - 7 x weekly - 1 sets - 5 reps - 2-3 sec hold Standing Backward Shoulder Rolls - 2 x daily - 7 x weekly - 1 sets - 10 reps - 1-2 sec hold Doorway Pec Stretch at 60 Degrees Abduction - 3 x daily - 7 x weekly - 3 reps - 1 sets Doorway Pec Stretch at 90 Degrees Abduction - 3 x daily - 7 x weekly - 3 reps - 1 sets - 30 seconds hold Doorway Pec Stretch at 120 Degrees Abduction - 3 x daily - 7 x weekly - 3 reps - 1 sets - 30 second hold hold Standing Shoulder External Rotation with Resistance - 2 x daily - 7 x weekly - 1-3 sets - 10 reps - 2-3 sec hold Standing Bilateral Low Shoulder Row with Anchored Resistance - 2 x daily - 7 x weekly - 1-3 sets - 10 reps - 2-3 sec hold Seated Single Arm Shoulder Horizontal Abduction and Adduction - 2 x daily - 7 x weekly - 1 sets - 3 reps - 30 sec hold Standing Shoulder Horizontal Abduction with Bent Arms and Dumbbells - 2 x daily - 7 x weekly - 1 sets - 3 reps - 30 sec hold Shoulder Horizontal Abduction - Thumbs Up - 2 x daily - 7 x weekly - 1 sets - 3 reps - 30 sec hold Hooklying Hamstring Stretch with Strap - 2 x daily - 7 x weekly - 1 sets - 3 reps - 30 sec hold Single Leg Stance - 2 x daily - 7 x weekly - 2 sets - 5 reps - 20 sec hold Gastroc Stretch on Wall - 2 x daily - 7 x weekly - 1 sets - 3 reps - 30 sec hold Standing Gastroc Stretch - 2 x daily - 7 x weekly - 1 sets - 3 reps - 30 sec hold Soleus Stretch on Wall - 2 x daily - 7 x weekly - 1 sets - 3 reps - 30 sec hold Standing Gastroc Stretch - 2 x daily - 7 x weekly - 1 sets - 3 reps - 30  sec hold Seated Hamstring Stretch - 2 x daily - 7 x weekly - 1 sets - 3 reps - 30 sec hold Seated Thoracic Extension and Rotation with Reach - 1 x daily - 7 x weekly - 3 sets - 10 reps

## 2020-12-05 NOTE — Therapy (Signed)
[place], [place], [address] Phone: [phone]   Fax:  [phone]  Physical Therapy Treatment  Patient Details  Name: Lacey Barron MRN: [id number] Date of Birth: 06/11/1956 Referring Provider (PT): Cline Crock, [place]   Encounter Date: 12/05/2020   PT End of Session - 12/05/20 0805    Visit Number 10    Number of Visits 12    Date for PT Re-Evaluation 12/13/20    Authorization - Visit Number 10    Authorization - Number of Visits 16    PT Start Time 0805    PT Stop Time 4481    PT Time Calculation (min) 52 min    Activity Tolerance Patient tolerated treatment well    Behavior During Therapy [facility] for tasks assessed/performed           Past Medical History:  Diagnosis Date  . Chronic knee pain    bilateral   . Complication of anesthesia    hard to wake  . GERD (gastroesophageal reflux disease)   . History of abnormal cervical Pap smear 2019  . Hypertension    followed by pcp  . IDA (iron deficiency anemia)   . Mild intermittent asthma    followed by pcp  . OA (osteoarthritis)    shoulders and knees  . PONV (postoperative nausea and vomiting)   . Sickle cell trait ([place])   . SUI (stress urinary incontinence, female)   . Uterine fibroid   . Wears glasses   . Wears partial dentures    upper and lower    Past Surgical History:  Procedure Laterality Date  . COLONOSCOPY  per pt scheduled 05-06-2020  . DILATATION & CURETTAGE/HYSTEROSCOPY WITH MYOSURE N/A 05/09/2020   Procedure: DILATATION & CURETTAGE/HYSTEROSCOPY WITH MYOSURE  RESECTION OF ENDOMETRIAL MASS;  Surgeon: Megan Salon, MD;  Location: [facility];  Service: Gynecology;  Laterality: N/A;  polyp resection  . TUBAL LIGATION Bilateral 1980s    There were no vitals filed for this visit.   Subjective Assessment - 12/05/20 0805    Subjective Traction has almost completely eliminated tingling. Still  stiffness and tight in right upper traps.    Pertinent History hx of Lt knee pain    Currently in Pain? No/denies                             [facility] Adult PT Treatment/Exercise - 12/05/20 0001      Exercises   Exercises Shoulder      Shoulder Exercises: Seated   Other Seated Exercises thoracic rotation demonstrated in seated for pt so she can do at work      Shoulder Exercises: Sidelying   Other Sidelying Exercises open book lying bil x 3; difficult and decreased mobility bil but pain in right shoulder so moved to standing      Shoulder Exercises: Standing   Other Standing Exercises standing with right side to wall: shoulder rainbows x 5 for thoracic rotation      Shoulder Exercises: ROM/Strengthening   UBE (Upper Arm Bike) L2 x 4 min alt fwd/back      Shoulder Exercises: Stretch   Other Shoulder Stretches midlevel doorway stretch x 15 sec x 3 reps. bilat bicep stretch with hands on door frame 2 x 15 sec    Other Shoulder Stretches T at wall for stretch through the pecs 20-30 sec x 1 rep.  Moist Heat Therapy   Number Minutes Moist Heat 15 Minutes    Moist Heat Location Cervical   while on traction     Traction   Type of Traction Cervical    Min (lbs) 14    Max (lbs) 20    Hold Time 30    Rest Time 10    Time 15      Manual Therapy   Manual Therapy Soft tissue mobilization    Manual therapy comments Skilled palpation and monitoring of soft tissues during DN    Soft tissue mobilization to right lats and Infraspinatus            Trigger Point Dry Needling - 12/05/20 0001    Consent Given? Yes    Education Handout Provided Previously provided    Muscles Treated Head and Neck Upper trapezius;Levator scapulae    Muscles Treated Upper Quadrant Infraspinatus;Latissimus dorsi    Other Dry Needling UT bil; else Rt    Upper Trapezius Response Twitch reponse elicited;Palpable increased muscle length    Levator Scapulae Response Palpable increased muscle  length    Infraspinatus Response Twitch response elicited;Palpable increased muscle length    Latissimus dorsi Response Twitch response elicited;Palpable increased muscle length                PT Education - 12/05/20 0847    Education Details HEP    Person(s) Educated Patient    Methods Explanation;Demonstration;Handout    Comprehension Verbalized understanding;Returned demonstration               PT Long Term Goals - 11/28/20 0926      PT LONG TERM GOAL #1   Title Improve posture and alignment with patient to demonstrate improved upright posture of head and neck with posterior shoulder girdle engaged    Time 6    Period Weeks    Status On-going      PT LONG TERM GOAL #2   Title Increased cervical extension; lateral flexion; rotation by 10-15 degrees or more in all planes    Time 6    Period Weeks    Status Partially Met      PT LONG TERM GOAL #3   Title Decrease pain with functional activities and sleeping by 50-75% allowing patient to live more normally    Time 6    Period Weeks    Status On-going      PT LONG TERM GOAL #4   Title Independent in HEP    Time 6    Period Weeks    Status On-going      PT LONG TERM GOAL #5   Title Improve functional limitatioin score to 51    Time 6    Period Weeks    Status On-going                 Plan - 12/05/20 1103    Clinical Impression Statement Patient continuing to report decreased UE sx since starting traction and with mainly reports of stiffness in her neck/upper traps. She does state that increased activity still brings increased pain. She has marked limitations in throracic rotation which we worked on today. Supine was difficult and she had some pain in the R UE so we moved to standing shoulder arcs. Seated was also demonstrated so pt could do at work. Good response to DN and traction today.    PT Treatment/Interventions ADLs/Self Care Home Management;Aquatic Therapy;Cryotherapy;Electrical  Stimulation;Iontophoresis 91m/ml Dexamethasone;Moist Heat;Ultrasound;Functional mobility training;Therapeutic activities;Therapeutic exercise;Neuromuscular re-education;Patient/family education;Manual  techniques;Passive range of motion;Dry needling;Taping    PT Next Visit Plan progress with postural correction and ROM exercises; continue DN/manual work; ergonomic education;  assess Lt knee (if referral received) assess response to DN in pecs and tape for shoulders. Contine to assess response to mechanical cervical traction.    PT Home Exercise Plan [email or web address]           Patient will benefit from skilled therapeutic intervention in order to improve the following deficits and impairments:  Abnormal gait,Decreased range of motion,Increased fascial restricitons,Impaired UE functional use,Decreased activity tolerance,Pain,Hypomobility,Impaired flexibility,Improper body mechanics,Decreased mobility,Postural dysfunction  Visit Diagnosis: Cervicalgia  Abnormal posture  Other symptoms and signs involving the musculoskeletal system     Problem List Patient Active Problem List   Diagnosis Date Noted  . Chronic pain of both knees 02/25/2020  . Urinary incontinence 02/19/2019  . 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 Allergic rhinitis 11/10/2015  . Asthma 11/10/2015  . CLAUDICATION, INTERMITTENT 06/12/2010  . ANEMIA-IRON DEFICIENCY 07/20/2008  . Obesity 07/19/2008  . HYPERTENSION, BENIGN, MILD 07/19/2008    Madelyn Flavors PT 12/05/2020, 11:17 AM  [facility] [place], [place], [address] Phone: [phone]   Fax:  [phone]  Name: LEZLY RUMPF MRN: [id number] Date of Birth: 06/29/1956

## 2020-12-08 ENCOUNTER — Encounter: Payer: Self-pay | Admitting: Rehabilitative and Restorative Service Providers"

## 2020-12-08 ENCOUNTER — Other Ambulatory Visit: Payer: Self-pay

## 2020-12-08 ENCOUNTER — Ambulatory Visit (INDEPENDENT_AMBULATORY_CARE_PROVIDER_SITE_OTHER): Payer: PRIVATE HEALTH INSURANCE | Admitting: Rehabilitative and Restorative Service Providers"

## 2020-12-08 DIAGNOSIS — R293 Abnormal posture: Secondary | ICD-10-CM

## 2020-12-08 DIAGNOSIS — R29898 Other symptoms and signs involving the musculoskeletal system: Secondary | ICD-10-CM | POA: Diagnosis not present

## 2020-12-08 DIAGNOSIS — M542 Cervicalgia: Secondary | ICD-10-CM | POA: Diagnosis not present

## 2020-12-08 NOTE — Therapy (Signed)
[place], [place], [address] Phone: [phone]   Fax:  [phone]  Physical Therapy Treatment  Patient Details  Name: Lacey Barron MRN: [id number] Date of Birth: May 29, 1956 Referring Provider (PT): Cline Crock, [place]   Encounter Date: 12/08/2020   PT End of Session - 12/08/20 0807    Visit Number 11    Number of Visits 12    Date for PT Re-Evaluation 12/13/20    Authorization - Number of Visits 16    PT Start Time 0802    PT Stop Time 0852    PT Time Calculation (min) 50 min    Activity Tolerance Patient tolerated treatment well           Past Medical History:  Diagnosis Date  . Chronic knee pain    bilateral   . Complication of anesthesia    hard to wake  . GERD (gastroesophageal reflux disease)   . History of abnormal cervical Pap smear 2019  . Hypertension    followed by pcp  . IDA (iron deficiency anemia)   . Mild intermittent asthma    followed by pcp  . OA (osteoarthritis)    shoulders and knees  . PONV (postoperative nausea and vomiting)   . Sickle cell trait ([place])   . SUI (stress urinary incontinence, female)   . Uterine fibroid   . Wears glasses   . Wears partial dentures    upper and lower    Past Surgical History:  Procedure Laterality Date  . COLONOSCOPY  per pt scheduled 05-06-2020  . DILATATION & CURETTAGE/HYSTEROSCOPY WITH MYOSURE N/A 05/09/2020   Procedure: DILATATION & CURETTAGE/HYSTEROSCOPY WITH MYOSURE  RESECTION OF ENDOMETRIAL MASS;  Surgeon: Megan Salon, MD;  Location: [facility];  Service: Gynecology;  Laterality: N/A;  polyp resection  . TUBAL LIGATION Bilateral 1980s    There were no vitals filed for this visit.   Subjective Assessment - 12/08/20 0808    Subjective Improving. Feels the best she has in a long time. Sleeping better. Less tingling. Pain and tightness continue in the Rt upper trap area.    Currently in  Pain? No/denies    Pain Score 0-No pain    Pain Location Shoulder    Pain Orientation Right    Pain Descriptors / Indicators Tightness              [facility] PT Assessment - 12/08/20 0001      Assessment   Medical Diagnosis Cervicalgia; Lt knee pain    Referring Provider (PT) Cline Crock, PA-C    Onset Date/Surgical Date 09/29/20    Hand Dominance Right    Next MD Visit 12/20/20    Prior Therapy none      AROM   Cervical Flexion 47    Cervical Extension 9    Cervical - Right Side Bend 15    Cervical - Left Side Bend 17    Cervical - Right Rotation 35    Cervical - Left Rotation 33                         [facility] Adult PT Treatment/Exercise - 12/08/20 0001      Neck Exercises: Seated   Neck Retraction 10 reps;3 secs    Cervical Rotation Right;Left;5 reps    Lateral Flexion Right;Left;5 reps    Shoulder Rolls Backwards;10 reps   2 sets during treatment  Shoulder Exercises: Standing   Other Standing Exercises activation of serratus yellow TB pushing up 5 reps x 2 sec      Shoulder Exercises: ROM/Strengthening   UBE (Upper Arm Bike) L3 x 4 min alt fwd/back      Shoulder Exercises: Stretch   Other Shoulder Stretches midlevel doorway stretch x 15 sec x 3 reps. bilat bicep stretch with hands on door frame 2 x 15 sec    Other Shoulder Stretches T at wall for stretch through the pecs 20-30 sec x 1 rep.      Traction   Min (lbs) 20    Max (lbs) 14    Hold Time 30    Rest Time 10    Time 15      Manual Therapy   Manual Therapy Soft tissue mobilization    Manual therapy comments Skilled palpation and monitoring of soft tissues during DN    Joint Mobilization PA and lateral mobs lower cervical upper thoracic spine Grade II to III    Soft tissue mobilization Rt cervical and upper trap musculature                       PT Long Term Goals - 11/28/20 0926      PT LONG TERM GOAL #1   Title Improve posture and alignment with patient to  demonstrate improved upright posture of head and neck with posterior shoulder girdle engaged    Time 6    Period Weeks    Status On-going      PT LONG TERM GOAL #2   Title Increased cervical extension; lateral flexion; rotation by 10-15 degrees or more in all planes    Time 6    Period Weeks    Status Partially Met      PT LONG TERM GOAL #3   Title Decrease pain with functional activities and sleeping by 50-75% allowing patient to live more normally    Time 6    Period Weeks    Status On-going      PT LONG TERM GOAL #4   Title Independent in HEP    Time 6    Period Weeks    Status On-going      PT LONG TERM GOAL #5   Title Improve functional limitatioin score to 51    Time 6    Period Weeks    Status On-going                 Plan - 12/08/20 0809    Clinical Impression Statement Continued improvement in the cervical radiculopathy. Less pain. Increasing mobility and ROM. Functional activity level is increasing. Patient is sleeping better. Traction helps. Progressing gradually toward stated goals of therapy.    Rehab Potential Good    PT Frequency 2x / week    PT Duration 6 weeks    PT Treatment/Interventions ADLs/Self Care Home Management;Aquatic Therapy;Cryotherapy;Electrical Stimulation;Iontophoresis 23m/ml Dexamethasone;Moist Heat;Ultrasound;Functional mobility training;Therapeutic activities;Therapeutic exercise;Neuromuscular re-education;Patient/family education;Manual techniques;Passive range of motion;Dry needling;Taping    PT Next Visit Plan progress with postural correction and ROM exercises; continue DN/manual work; ergonomic education;  assess Lt knee (if referral received) continue DN in upper trap, pecs, scapular musculature and tape for shoulders. Continue mechanical cervical traction    PT Home Exercise Plan [email or web address]   Consulted and Agree with Plan of Care Patient           Patient will benefit from skilled therapeutic intervention in order to improve  the following deficits and impairments:     Visit Diagnosis: Cervicalgia  Abnormal posture  Other symptoms and signs involving the musculoskeletal system     Problem List Patient Active Problem List   Diagnosis Date Noted  . Chronic pain of both knees 02/25/2020  . Urinary incontinence 02/19/2019  . 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 Allergic rhinitis 11/10/2015  . Asthma 11/10/2015  . CLAUDICATION, INTERMITTENT 06/12/2010  . ANEMIA-IRON DEFICIENCY 07/20/2008  . Obesity 07/19/2008  . HYPERTENSION, BENIGN, MILD 07/19/2008    Sylena Lotter Nilda Simmer PT, MPH  12/08/2020, 8:43 AM  [facility] [place], [place], [address] Phone: [phone]   Fax:  [phone]  Name: SEAN MACWILLIAMS MRN: [id number] Date of Birth: [birth year]

## 2020-12-12 ENCOUNTER — Ambulatory Visit (INDEPENDENT_AMBULATORY_CARE_PROVIDER_SITE_OTHER): Payer: PRIVATE HEALTH INSURANCE | Admitting: Physical Therapy

## 2020-12-12 ENCOUNTER — Other Ambulatory Visit: Payer: Self-pay

## 2020-12-12 DIAGNOSIS — M542 Cervicalgia: Secondary | ICD-10-CM | POA: Diagnosis not present

## 2020-12-12 DIAGNOSIS — R293 Abnormal posture: Secondary | ICD-10-CM

## 2020-12-12 DIAGNOSIS — R29898 Other symptoms and signs involving the musculoskeletal system: Secondary | ICD-10-CM

## 2020-12-12 NOTE — Therapy (Signed)
[place], [place], [address] Phone: [phone]   Fax:  [phone]  Physical Therapy Treatment  Patient Details  Name: Lacey Barron MRN: [id number] Date of Birth: [birth year] Referring Provider (PT): Cline Crock, [place]   Encounter Date: 12/12/2020   PT End of Session - 12/12/20 0847    Visit Number 12    Number of Visits 12    Date for PT Re-Evaluation 12/13/20    Authorization - Visit Number 12    Authorization - Number of Visits 16    PT Start Time 0802    PT Stop Time 0850    PT Time Calculation (min) 48 min    Activity Tolerance Patient tolerated treatment well    Behavior During Therapy [facility] for tasks assessed/performed           Past Medical History:  Diagnosis Date  . Chronic knee pain    bilateral   . Complication of anesthesia    hard to wake  . GERD (gastroesophageal reflux disease)   . History of abnormal cervical Pap smear 2019  . Hypertension    followed by pcp  . IDA (iron deficiency anemia)   . Mild intermittent asthma    followed by pcp  . OA (osteoarthritis)    shoulders and knees  . PONV (postoperative nausea and vomiting)   . Sickle cell trait ([place])   . SUI (stress urinary incontinence, female)   . Uterine fibroid   . Wears glasses   . Wears partial dentures    upper and lower    Past Surgical History:  Procedure Laterality Date  . COLONOSCOPY  per pt scheduled 05-06-2020  . DILATATION & CURETTAGE/HYSTEROSCOPY WITH MYOSURE N/A 05/09/2020   Procedure: DILATATION & CURETTAGE/HYSTEROSCOPY WITH MYOSURE  RESECTION OF ENDOMETRIAL MASS;  Surgeon: Megan Salon, MD;  Location: [facility];  Service: Gynecology;  Laterality: N/A;  polyp resection  . TUBAL LIGATION Bilateral 1980s    There were no vitals filed for this visit.   Subjective Assessment - 12/12/20 0803    Subjective "I have a little pinch in my (Right side) neck, but other  than that I'm good".  Pt reports the new exercises (ES:PQZRAQT motion with arms) are helping.  She is only having one episode of numbness each week, as compare to constant.    Currently in Pain? Yes    Pain Score 2     Pain Location Neck    Pain Orientation Right    Pain Descriptors / Indicators Tightness;Dull    Aggravating Factors  activity    Pain Relieving Factors TENS, heat, rest              [facility] PT Assessment - 12/12/20 0001      Assessment   Medical Diagnosis Cervicalgia; Lt knee pain    Referring Provider (PT) Cline Crock, PA-C    Onset Date/Surgical Date 09/29/20    Hand Dominance Right    Next MD Visit 12/20/20    Prior Therapy none      Observation/Other Assessments   Focus on Therapeutic Outcomes (FOTO)  53      AROM   Cervical Extension 7    Cervical - Right Side Bend 22    Cervical - Left Side Bend 27    Cervical - Right Rotation 43    Cervical - Left Rotation 39            [facility] Adult  PT Treatment/Exercise - 12/12/20 0001      Neck Exercises: Seated   Cervical Rotation Right;Left;5 reps    Lateral Flexion Right;Left;5 reps    Shoulder Rolls Backwards;10 reps   2 sets during treatment   Other Seated Exercise leaning forward, resting arms on thighs - cervical flexion to neutral x 4 reps.      Shoulder Exercises: Standing   Other Standing Exercises thoracic rotation with shoulder rainbows x 6 reps each side.    Other Standing Exercises activation of serratus yellow TB pushing up 5 reps x 2 sec      Shoulder Exercises: ROM/Strengthening   UBE (Upper Arm Bike) L2:  1.5 min each forward and backward, standing      Traction   Type of Traction Cervical    Min (lbs) 14    Max (lbs) 20    Hold Time 30    Rest Time 10    Time 15      Manual Therapy   Soft tissue mobilization Rt/Lt upper trap, scalenes, pec major/minor    Myofascial Release Rt/Lt upper trap, scalenes, pec major/minor      Neck Exercises: Stretches   Upper Trapezius Stretch  Right;Left;10 seconds;3 reps    Levator Stretch Right;Left;2 reps;10 seconds             PT Long Term Goals - 12/12/20 0845      PT LONG TERM GOAL #1   Title Improve posture and alignment with patient to demonstrate improved upright posture of head and neck with posterior shoulder girdle engaged    Time 6    Period Weeks    Status On-going      PT LONG TERM GOAL #2   Title Increased cervical extension; lateral flexion; rotation by 10-15 degrees or more in all planes    Time 6    Period Weeks    Status Partially Met      PT LONG TERM GOAL #3   Title Decrease pain with functional activities and sleeping by 50-75% allowing patient to live more normally    Time 6    Period Weeks    Status Partially Met      PT LONG TERM GOAL #4   Title Independent in HEP    Time 6    Period Weeks    Status On-going      PT LONG TERM GOAL #5   Title Improve functional limitatioin score to 51    Time 6    Period Weeks    Status Achieved                 Plan - 12/12/20 0837    Clinical Impression Statement Gradual improvement in neck ROM noted.  FOTO score has improved to 53.  Pt reports increased tingling into neck (respective side) with STM/MFR to scalenes and pec major/minor; remained through rest of session.  Pt had difficulty completing Lt thoracic rotation due to increased discomfort in Lt posterior shoulder with rainbows/ horiz abdct. Cervical extension continues to be limited.  Pt progressing gradually towards remaining goals.    Rehab Potential Good    PT Frequency 2x / week    PT Duration 6 weeks    PT Treatment/Interventions ADLs/Self Care Home Management;Aquatic Therapy;Cryotherapy;Electrical Stimulation;Iontophoresis 22m/ml Dexamethasone;Moist Heat;Ultrasound;Functional mobility training;Therapeutic activities;Therapeutic exercise;Neuromuscular re-education;Patient/family education;Manual techniques;Passive range of motion;Dry needling;Taping    PT Next Visit Plan progress  with postural correction and ROM exercises; continue DN/manual work; ergonomic education;  assess Lt knee (if  referral received) continue DN in upper trap, pecs, scapular musculature and tape for shoulders. Continue mechanical cervical traction.  END OF POC.    PT Home Exercise Plan [email or web address]    Consulted and Agree with Plan of Care Patient           Patient will benefit from skilled therapeutic intervention in order to improve the following deficits and impairments:  Abnormal gait,Decreased range of motion,Increased fascial restricitons,Impaired UE functional use,Decreased activity tolerance,Pain,Hypomobility,Impaired flexibility,Improper body mechanics,Decreased mobility,Postural dysfunction  Visit Diagnosis: Cervicalgia  Abnormal posture  Other symptoms and signs involving the musculoskeletal system     Problem List Patient Active Problem List   Diagnosis Date Noted  . Chronic pain of both knees 02/25/2020  . Urinary incontinence 02/19/2019  . 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 Allergic rhinitis 11/10/2015  . Asthma 11/10/2015  . CLAUDICATION, INTERMITTENT 06/12/2010  . ANEMIA-IRON DEFICIENCY 07/20/2008  . Obesity 07/19/2008  . HYPERTENSION, BENIGN, MILD 07/19/2008   Kerin Perna, PTA 12/12/20 8:48 AM  [facility] [place], [place], [address] Phone: [phone]   Fax:  [phone]  Name: TONIQUE MENDONCA MRN: [id number] Date of Birth: 1956-07-23

## 2020-12-15 ENCOUNTER — Ambulatory Visit (INDEPENDENT_AMBULATORY_CARE_PROVIDER_SITE_OTHER): Payer: PRIVATE HEALTH INSURANCE | Admitting: Rehabilitative and Restorative Service Providers"

## 2020-12-15 ENCOUNTER — Other Ambulatory Visit: Payer: Self-pay

## 2020-12-15 ENCOUNTER — Encounter: Payer: Self-pay | Admitting: Rehabilitative and Restorative Service Providers"

## 2020-12-15 DIAGNOSIS — R293 Abnormal posture: Secondary | ICD-10-CM

## 2020-12-15 DIAGNOSIS — M542 Cervicalgia: Secondary | ICD-10-CM | POA: Diagnosis not present

## 2020-12-15 DIAGNOSIS — R29898 Other symptoms and signs involving the musculoskeletal system: Secondary | ICD-10-CM | POA: Diagnosis not present

## 2020-12-15 NOTE — Therapy (Signed)
[place], [place], [address] Phone: [phone]   Fax:  [phone]  Physical Therapy Treatment  Patient Details  Name: Lacey Barron MRN: [id number] Date of Birth: 11-04-1955 Referring Provider (PT): Cline Crock, [place]   Encounter Date: 12/15/2020   PT End of Session - 12/15/20 0807    Visit Number 13    Number of Visits 24    Date for PT Re-Evaluation 01/26/21    Authorization - Visit Number 13    Authorization - Number of Visits 16    PT Start Time 0800    PT Stop Time 0852    PT Time Calculation (min) 52 min    Activity Tolerance Patient tolerated treatment well           Past Medical History:  Diagnosis Date  . Chronic knee pain    bilateral   . Complication of anesthesia    hard to wake  . GERD (gastroesophageal reflux disease)   . History of abnormal cervical Pap smear 2019  . Hypertension    followed by pcp  . IDA (iron deficiency anemia)   . Mild intermittent asthma    followed by pcp  . OA (osteoarthritis)    shoulders and knees  . PONV (postoperative nausea and vomiting)   . Sickle cell trait ([place])   . SUI (stress urinary incontinence, female)   . Uterine fibroid   . Wears glasses   . Wears partial dentures    upper and lower    Past Surgical History:  Procedure Laterality Date  . COLONOSCOPY  per pt scheduled 05-06-2020  . DILATATION & CURETTAGE/HYSTEROSCOPY WITH MYOSURE N/A 05/09/2020   Procedure: DILATATION & CURETTAGE/HYSTEROSCOPY WITH MYOSURE  RESECTION OF ENDOMETRIAL MASS;  Surgeon: Megan Salon, MD;  Location: [facility];  Service: Gynecology;  Laterality: N/A;  polyp resection  . TUBAL LIGATION Bilateral 1980s    There were no vitals filed for this visit.   Subjective Assessment - 12/15/20 0808    Subjective Still has some tightness in the neck area. She has more discomfort and spasm at the end of the work day. Has to use  the heat and TENS after work more. Tingling is gone but spasms continue to be a problem. This is the best morning she has has since she started therapy.    Currently in Pain? Yes    Pain Score 1     Pain Location Neck    Pain Orientation Right    Pain Descriptors / Indicators Tightness;Tingling              [facility] PT Assessment - 12/15/20 0001      Assessment   Medical Diagnosis Cervicalgia; Lt knee pain    Referring Provider (PT) Cline Crock, PA-C    Onset Date/Surgical Date 09/29/20    Hand Dominance Right    Next MD Visit 12/20/20    Prior Therapy none      AROM   Right Shoulder Flexion 135 Degrees    Right Shoulder ABduction 126 Degrees    Left Shoulder Flexion 125 Degrees    Left Shoulder ABduction 122 Degrees    Cervical Flexion 48    Cervical Extension 8    Cervical - Right Side Bend 22    Cervical - Left Side Bend 20    Cervical - Right Rotation 48    Cervical - Left Rotation 41      Palpation  Spinal mobility hypomobile thoracic and cervical - pain with PA mobs    Palpation comment continued muscular tightness through Rt > Lt pecs; ant/lat/post cervical musculature; cervical and thoracic paraspinals; upper traps; periscapular musculature                         [facility] Adult PT Treatment/Exercise - 12/15/20 0001      Neck Exercises: Seated   Cervical Rotation Right;Left;5 reps    Lateral Flexion Right;Left;5 reps    Shoulder Rolls Backwards;10 reps   2 sets during treatment     Shoulder Exercises: Standing   Flexion AROM;Both;5 reps    ABduction AROM;Both;5 reps    Other Standing Exercises thoracic rotation with shoulder rainbows x 6 reps each side.      Shoulder Exercises: ROM/Strengthening   UBE (Upper Arm Bike) L5:  2 min each forward and backward, standing      Moist Heat Therapy   Number Minutes Moist Heat 15 Minutes    Moist Heat Location Cervical;Shoulder   with cervical traction     Traction   Min (lbs) 16    Max (lbs)  22    Hold Time 30    Rest Time 10    Time 15      Manual Therapy   Manual therapy comments Skilled palpation and monitoring of soft tissues during DN    Joint Mobilization PA and lateral mobs lower cervical upper thoracic spine Grade II to III    Soft tissue mobilization Rt/Lt upper trap, scalenes pt prone    Myofascial Release bilat upper traps            Trigger Point Dry Needling - 12/15/20 0001    Consent Given? Yes    Education Handout Provided Previously provided    Other Dry Needling bilat    Upper Trapezius Response Palpable increased muscle length;Twitch reponse elicited    Levator Scapulae Response Palpable increased muscle length    Cervical multifidi Response Palpable increased muscle length                     PT Long Term Goals - 12/15/20 0841      PT LONG TERM GOAL #1   Title Improve posture and alignment with patient to demonstrate improved upright posture of head and neck with posterior shoulder girdle engaged    Time 6    Period Weeks    Status On-going    Target Date 01/26/21      PT LONG TERM GOAL #2   Title Increased cervical extension; lateral flexion; rotation by 10-15 degrees or more in all planes    Time 6    Period Weeks    Status Partially Met    Target Date 01/26/21      PT LONG TERM GOAL #3   Title Decrease pain with functional activities and sleeping by 50-75% allowing patient to live more normally    Time 6    Period Weeks    Status Partially Met    Target Date 01/26/21      PT LONG TERM GOAL #4   Title Independent in HEP    Time 6    Period Weeks    Status On-going    Target Date 01/26/21      PT LONG TERM GOAL #5   Title Improve functional limitatioin score to 51    Time 6    Period Weeks    Status Achieved  Plan - 12/15/20 0842    Clinical Impression Statement Continued gradual improvement with pt reporting decresaed pain and resolution of radicular symptoms. She demonstrates increased  cervical ROM/mobility but remains limited in cervical and shoulder ROM. Patient has improveing muscular tightness to palpation. Functional activity level is increasing. Goals of treatment remain appropriate.    Rehab Potential Good    PT Frequency 2x / week    PT Duration 6 weeks    PT Treatment/Interventions ADLs/Self Care Home Management;Aquatic Therapy;Cryotherapy;Electrical Stimulation;Iontophoresis 41m/ml Dexamethasone;Moist Heat;Ultrasound;Functional mobility training;Therapeutic activities;Therapeutic exercise;Neuromuscular re-education;Patient/family education;Manual techniques;Passive range of motion;Dry needling;Taping;Traction    PT Next Visit Plan progress with postural correction and ROM exercises; continue DN/manual work; ergonomic education;  assess Lt knee (if referral received) continue DN in upper trap, pecs, scapular musculature and tape for shoulders. Continue mechanical cervical traction.    PT Home Exercise Plan [email or web address]   Consulted and Agree with Plan of Care Patient           Patient will benefit from skilled therapeutic intervention in order to improve the following deficits and impairments:     Visit Diagnosis: Cervicalgia  Abnormal posture  Other symptoms and signs involving the musculoskeletal system     Problem List Patient Active Problem List   Diagnosis Date Noted  . Chronic pain of both knees 02/25/2020  . Urinary incontinence 02/19/2019  . 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 Allergic rhinitis 11/10/2015  . Asthma 11/10/2015  . CLAUDICATION, INTERMITTENT 06/12/2010  . ANEMIA-IRON DEFICIENCY 07/20/2008  . Obesity 07/19/2008  . HYPERTENSION, BENIGN, MILD 07/19/2008    Elsye Mccollister PNilda SimmerPT, MPH 12/15/2020, 8:45 AM  [facility] [address]Phone: [phone]  Fax:  [phone] Name: Lacey WHITACREMRN: [id number] of Birth: [birth year]

## 2020-12-19 ENCOUNTER — Encounter: Payer: Self-pay | Admitting: Rehabilitative and Restorative Service Providers"

## 2020-12-19 ENCOUNTER — Ambulatory Visit (INDEPENDENT_AMBULATORY_CARE_PROVIDER_SITE_OTHER): Payer: PRIVATE HEALTH INSURANCE | Admitting: Rehabilitative and Restorative Service Providers"

## 2020-12-19 ENCOUNTER — Other Ambulatory Visit: Payer: Self-pay

## 2020-12-19 ENCOUNTER — Other Ambulatory Visit (HOSPITAL_COMMUNITY): Payer: Self-pay

## 2020-12-19 DIAGNOSIS — M542 Cervicalgia: Secondary | ICD-10-CM

## 2020-12-19 DIAGNOSIS — R29898 Other symptoms and signs involving the musculoskeletal system: Secondary | ICD-10-CM | POA: Diagnosis not present

## 2020-12-19 DIAGNOSIS — R293 Abnormal posture: Secondary | ICD-10-CM

## 2020-12-19 MED FILL — Lisinopril & Hydrochlorothiazide Tab 20-25 MG: ORAL | 90 days supply | Qty: 90 | Fill #0 | Status: AC

## 2020-12-19 MED FILL — Fluticasone Propionate Nasal Susp 50 MCG/ACT: NASAL | 30 days supply | Qty: 16 | Fill #0 | Status: AC

## 2020-12-19 MED FILL — Amlodipine Besylate Tab 5 MG (Base Equivalent): ORAL | 90 days supply | Qty: 90 | Fill #0 | Status: AC

## 2020-12-19 MED FILL — Albuterol Sulfate Inhal Aero 108 MCG/ACT (90MCG Base Equiv): RESPIRATORY_TRACT | 17 days supply | Qty: 18 | Fill #0 | Status: AC

## 2020-12-19 NOTE — Patient Instructions (Signed)
Access Code: [email or web address]: [email or web address]: 05/02/2022Prepared by: Kimmie Berggren HoltExercises  Seated Scapular Retraction - 2 x daily - 7 x weekly - 1-2 sets - 10 reps - 10 sec hold  Seated Cervical Retraction - 2 x daily - 7 x weekly - 1-2 sets - 5-10 reps - 10 sec hold  Seated Cervical Sidebending AROM - 2 x daily - 7 x weekly - 1 sets - 5 reps - 5-10 sec hold  Seated Cervical Rotation AROM - 2 x daily - 7 x weekly - 1 sets - 5 reps - 2-3 sec hold  Standing Backward Shoulder Rolls - 2 x daily - 7 x weekly - 1 sets - 10 reps - 1-2 sec hold  Doorway Pec Stretch at 60 Degrees Abduction - 3 x daily - 7 x weekly - 3 reps - 1 sets  Doorway Pec Stretch at 90 Degrees Abduction - 3 x daily - 7 x weekly - 3 reps - 1 sets - 30 seconds hold  Doorway Pec Stretch at 120 Degrees Abduction - 3 x daily - 7 x weekly - 3 reps - 1 sets - 30 second hold hold  Standing Shoulder External Rotation with Resistance - 2 x daily - 7 x weekly - 1-3 sets - 10 reps - 2-3 sec hold  Standing Bilateral Low Shoulder Row with Anchored Resistance - 2 x daily - 7 x weekly - 1-3 sets - 10 reps - 2-3 sec hold  Seated Single Arm Shoulder Horizontal Abduction and Adduction - 2 x daily - 7 x weekly - 1 sets - 3 reps - 30 sec hold  Standing Shoulder Horizontal Abduction with Bent Arms and Dumbbells - 2 x daily - 7 x weekly - 1 sets - 3 reps - 30 sec hold  Shoulder Horizontal Abduction - Thumbs Up - 2 x daily - 7 x weekly - 1 sets - 3 reps - 30 sec hold  Hooklying Hamstring Stretch with Strap - 2 x daily - 7 x weekly - 1 sets - 3 reps - 30 sec hold  Single Leg Stance - 2 x daily - 7 x weekly - 2 sets - 5 reps - 20 sec hold  Gastroc Stretch on Wall - 2 x daily - 7 x weekly - 1 sets - 3 reps - 30 sec hold  Standing Gastroc Stretch - 2 x daily - 7 x weekly - 1 sets - 3 reps - 30 sec hold  Soleus Stretch on Wall - 2 x daily - 7 x weekly - 1 sets - 3 reps - 30 sec hold  Standing Gastroc Stretch - 2 x daily - 7 x weekly - 1 sets  - 3 reps - 30 sec hold  Seated Hamstring Stretch - 2 x daily - 7 x weekly - 1 sets - 3 reps - 30 sec hold  Seated Thoracic Extension and Rotation with Reach - 1 x daily - 7 x weekly - 3 sets - 10 reps  Shoulder Flexion Serratus Activation with Resistance - 1 x daily - 7 x weekly - 1 sets - 10 reps - 3-5 sec hold  Upper Cervical Extension SNAG with Strap - 2 x daily - 7 x weekly - 1 sets - 5 reps - 5-10 sec hold

## 2020-12-19 NOTE — Therapy (Signed)
[place], [place], [address] Phone: [phone]   Fax:  [phone]  Physical Therapy Treatment  Patient Details  Name: Lacey Barron MRN: [id number] Date of Birth: [birth year] Referring Provider (PT): Cline Crock, [place]   Encounter Date: 12/19/2020   PT End of Session - 12/19/20 0803    Visit Number 14    Number of Visits 24    Date for PT Re-Evaluation 01/26/21    Authorization - Visit Number 14    Authorization - Number of Visits 16    PT Start Time 0800    PT Stop Time 0851    PT Time Calculation (min) 51 min    Activity Tolerance Patient tolerated treatment well           Past Medical History:  Diagnosis Date  . Chronic knee pain    bilateral   . Complication of anesthesia    hard to wake  . GERD (gastroesophageal reflux disease)   . History of abnormal cervical Pap smear 2019  . Hypertension    followed by pcp  . IDA (iron deficiency anemia)   . Mild intermittent asthma    followed by pcp  . OA (osteoarthritis)    shoulders and knees  . PONV (postoperative nausea and vomiting)   . Sickle cell trait ([place])   . SUI (stress urinary incontinence, female)   . Uterine fibroid   . Wears glasses   . Wears partial dentures    upper and lower    Past Surgical History:  Procedure Laterality Date  . COLONOSCOPY  per pt scheduled 05-06-2020  . DILATATION & CURETTAGE/HYSTEROSCOPY WITH MYOSURE N/A 05/09/2020   Procedure: DILATATION & CURETTAGE/HYSTEROSCOPY WITH MYOSURE  RESECTION OF ENDOMETRIAL MASS;  Surgeon: Megan Salon, MD;  Location: [facility];  Service: Gynecology;  Laterality: N/A;  polyp resection  . TUBAL LIGATION Bilateral 1980s    There were no vitals filed for this visit.   Subjective Assessment - 12/19/20 0803    Subjective Patient reports that she is gradually improving. Can't look up. Some tingling in the back of the Rt arm.    Currently  in Pain? Yes    Pain Score 2     Pain Location Neck    Pain Orientation Right    Pain Descriptors / Indicators Tightness;Tingling    Pain Type Acute pain              [facility] PT Assessment - 12/19/20 0001      Assessment   Medical Diagnosis Cervicalgia; Lt knee pain    Referring Provider (PT) Cline Crock, PA-C    Onset Date/Surgical Date 09/29/20    Hand Dominance Right    Next MD Visit 12/20/20    Prior Therapy none      AROM   Cervical Flexion 48    Cervical Extension 13    Cervical - Right Side Bend 22    Cervical - Left Side Bend 20    Cervical - Right Rotation 48    Cervical - Left Rotation 41                         [facility] Adult PT Treatment/Exercise - 12/19/20 0001      Neck Exercises: Seated   Neck Retraction 5 reps;5 secs    Cervical Rotation Right;Left;5 reps    Lateral Flexion Right;Left;5 reps    Shoulder Rolls Backwards;10  reps   2 sets during treatment   Other Seated Exercise assisted cervical extension using pillowcase posterior cervical spine pulling up with UE as she extends neck 3-8 sec hold x 5 reps      Shoulder Exercises: Standing   Other Standing Exercises thoracic rotation with shoulder rainbows x 6 reps each side.      Shoulder Exercises: ROM/Strengthening   UBE (Upper Arm Bike) L5:  2 min each forward and backward, standing      Shoulder Exercises: Stretch   Other Shoulder Stretches lower and midlevel doorway stretch x 15 sec x 3 reps; stretch with hands on door frame 2 x 15 sec    Other Shoulder Stretches T at wall for stretch through the pecs 20-30 sec x 1 rep.      Moist Heat Therapy   Number Minutes Moist Heat 15 Minutes    Moist Heat Location Cervical;Shoulder   with cervical traction     Traction   Min (lbs) 16    Max (lbs) 22    Hold Time 30    Rest Time 10    Time 15      Manual Therapy   Manual therapy comments Skilled palpation and monitoring of soft tissues during DN    Joint Mobilization PA and  lateral mobs lower cervical upper thoracic spine Grade II to III    Soft tissue mobilization Rt/Lt upper trap, scalenes; Rt teres/triceps pt prone    Myofascial Release bilat upper traps            Trigger Point Dry Needling - 12/19/20 0001    Consent Given? Yes    Education Handout Provided Previously provided    Other Dry Needling Lt    Upper Trapezius Response Palpable increased muscle length;Twitch reponse elicited    Infraspinatus Response Palpable increased muscle length;Twitch response elicited    Latissimus dorsi Response Palpable increased muscle length;Twitch response elicited    Teres major Response Palpable increased muscle length;Twitch response elicited    Teres minor Response Palpable increased muscle length;Twitch response elicited    Triceps Response Palpable increased muscle length;Twitch response elicited                PT Education - 12/19/20 0843    Education Details HEP    Person(s) Educated Patient    Methods Explanation;Demonstration;Tactile cues;Verbal cues;Handout    Comprehension Verbalized understanding;Returned demonstration;Verbal cues required;Tactile cues required               PT Long Term Goals - 12/15/20 0841      PT LONG TERM GOAL #1   Title Improve posture and alignment with patient to demonstrate improved upright posture of head and neck with posterior shoulder girdle engaged    Time 6    Period Weeks    Status On-going    Target Date 01/26/21      PT LONG TERM GOAL #2   Title Increased cervical extension; lateral flexion; rotation by 10-15 degrees or more in all planes    Time 6    Period Weeks    Status Partially Met    Target Date 01/26/21      PT LONG TERM GOAL #3   Title Decrease pain with functional activities and sleeping by 50-75% allowing patient to live more normally    Time 6    Period Weeks    Status Partially Met    Target Date 01/26/21      PT LONG TERM GOAL #4  Title Independent in HEP    Time 6     Period Weeks    Status On-going    Target Date 01/26/21      PT LONG TERM GOAL #5   Title Improve functional limitatioin score to 51    Time 6    Period Weeks    Status Achieved                 Plan - 12/19/20 0805    Clinical Impression Statement Patient continues to improve.She has persistent pain with cervical ROM especially extension. She has tingling into the triceps area Rt UE. Good gains in cervical mobility with treatment. Progressing gradually toward stated goals of therapy.    Rehab Potential Good    PT Frequency 2x / week    PT Duration 6 weeks    PT Treatment/Interventions ADLs/Self Care Home Management;Aquatic Therapy;Cryotherapy;Electrical Stimulation;Iontophoresis 66m/ml Dexamethasone;Moist Heat;Ultrasound;Functional mobility training;Therapeutic activities;Therapeutic exercise;Neuromuscular re-education;Patient/family education;Manual techniques;Passive range of motion;Dry needling;Taping;Traction    PT Next Visit Plan progress with postural correction and ROM exercises; continue DN/manual work; ergonomic education;  assess Lt knee (if referral received) continue DN in upper trap, pecs, scapular musculature and tape for shoulders. Continue mechanical cervical traction.    PT Home Exercise Plan [email or web address]   Consulted and Agree with Plan of Care Patient           Patient will benefit from skilled therapeutic intervention in order to improve the following deficits and impairments:     Visit Diagnosis: Abnormal posture  Other symptoms and signs involving the musculoskeletal system  Cervicalgia     Problem List Patient Active Problem List   Diagnosis Date Noted  . Chronic pain of both knees 02/25/2020  . Urinary incontinence 02/19/2019  . 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 Allergic rhinitis 11/10/2015  . Asthma 11/10/2015  . CLAUDICATION, INTERMITTENT 06/12/2010  . ANEMIA-IRON DEFICIENCY 07/20/2008  . Obesity 07/19/2008  . HYPERTENSION, BENIGN, MILD  07/19/2008    Kemari Mares PNilda SimmerPT, MPH  12/19/2020, 8:43 AM  [facility] [address]Phone: [phone]  Fax:  [phone] Name: TYANIQUE MULVIHILLMRN: [id number] of Birth: 105/02/1956

## 2020-12-20 ENCOUNTER — Other Ambulatory Visit (HOSPITAL_COMMUNITY): Payer: Self-pay

## 2020-12-20 MED ORDER — BACLOFEN 10 MG PO TABS
10.0000 mg | ORAL_TABLET | Freq: Four times a day (QID) | ORAL | 0 refills | Status: DC
  Filled 2020-12-20: qty 60, 15d supply, fill #0

## 2020-12-21 ENCOUNTER — Other Ambulatory Visit (HOSPITAL_COMMUNITY): Payer: Self-pay

## 2020-12-22 ENCOUNTER — Other Ambulatory Visit: Payer: Self-pay

## 2020-12-22 ENCOUNTER — Encounter: Payer: Self-pay | Admitting: Rehabilitative and Restorative Service Providers"

## 2020-12-22 ENCOUNTER — Ambulatory Visit (INDEPENDENT_AMBULATORY_CARE_PROVIDER_SITE_OTHER): Payer: PRIVATE HEALTH INSURANCE | Admitting: Rehabilitative and Restorative Service Providers"

## 2020-12-22 DIAGNOSIS — M25562 Pain in left knee: Secondary | ICD-10-CM

## 2020-12-22 DIAGNOSIS — M542 Cervicalgia: Secondary | ICD-10-CM

## 2020-12-22 DIAGNOSIS — R293 Abnormal posture: Secondary | ICD-10-CM

## 2020-12-22 DIAGNOSIS — M6281 Muscle weakness (generalized): Secondary | ICD-10-CM | POA: Diagnosis not present

## 2020-12-22 DIAGNOSIS — R2689 Other abnormalities of gait and mobility: Secondary | ICD-10-CM

## 2020-12-22 DIAGNOSIS — R29898 Other symptoms and signs involving the musculoskeletal system: Secondary | ICD-10-CM | POA: Diagnosis not present

## 2020-12-22 NOTE — Therapy (Signed)
[place], [place], [address] Phone: [phone]   Fax:  [phone]  Physical Therapy Evaluation  Patient Details  Name: Lacey Barron MRN: [id number] Date of Birth: 08/17/1956 Referring Provider (PT): Cline Crock, [place]   Encounter Date: 12/22/2020   PT End of Session - 12/22/20 0844    Visit Number 15    Number of Visits 24    Date for PT Re-Evaluation 01/26/21    Authorization - Visit Number 15    Authorization - Number of Visits 24    PT Start Time 6761    PT Stop Time 0935    PT Time Calculation (min) 52 min    Activity Tolerance Patient tolerated treatment well           Past Medical History:  Diagnosis Date  . Chronic knee pain    bilateral   . Complication of anesthesia    hard to wake  . GERD (gastroesophageal reflux disease)   . History of abnormal cervical Pap smear 2019  . Hypertension    followed by pcp  . IDA (iron deficiency anemia)   . Mild intermittent asthma    followed by pcp  . OA (osteoarthritis)    shoulders and knees  . PONV (postoperative nausea and vomiting)   . Sickle cell trait ([place])   . SUI (stress urinary incontinence, female)   . Uterine fibroid   . Wears glasses   . Wears partial dentures    upper and lower    Past Surgical History:  Procedure Laterality Date  . COLONOSCOPY  per pt scheduled 05-06-2020  . DILATATION & CURETTAGE/HYSTEROSCOPY WITH MYOSURE N/A 05/09/2020   Procedure: DILATATION & CURETTAGE/HYSTEROSCOPY WITH MYOSURE  RESECTION OF ENDOMETRIAL MASS;  Surgeon: Megan Salon, MD;  Location: [facility];  Service: Gynecology;  Laterality: N/A;  polyp resection  . TUBAL LIGATION Bilateral 1980s    There were no vitals filed for this visit.    Subjective Assessment - 12/22/20 0845    Subjective Saw MD this week and he wants to continue with therapy. Still has knot in the Rt upper trap and now having some  tingling in the Lt arm. MD added referral treatment for the knee. Knee continues to be painful around the joint and inside part og knee/lower thigh. Taping helps with knee pain.    Currently in Pain? Yes    Pain Score 2     Pain Location Neck    Pain Orientation Right    Pain Descriptors / Indicators Tightness;Tingling   tingling in the Lt UE   Pain Score 2   6/10 during the night   Pain Location Knee    Pain Orientation Left;Medial    Pain Descriptors / Indicators Tightness;Shooting    Pain Type Chronic pain              [facility] PT Assessment - 12/22/20 0001      Assessment   Medical Diagnosis Cervicalgia; Lt knee pain    Referring Provider (PT) Cline Crock, PA-C    Onset Date/Surgical Date 09/29/20    Hand Dominance Right    Next MD Visit 02/05/21    Prior Therapy none      Balance Screen   Has the patient fallen in the past 6 months Yes    How many times? 1    Has the patient had a decrease in activity level because of a fear of falling?  No    Is the patient reluctant to leave their home because of a fear of falling?  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    Leisure household chores; cooking      Sensation   Additional Comments intermittent tingling LT triceps area no longer into hands      Posture/Postural Control   Posture Comments head forward; shouders rounded and elevated; head of the humerus anterior in orientation; increased thoracic kyphosis; standing with wt shifted to the Rt      AROM   Right/Left Hip --   tight Lt hip   Right/Left Knee --   assessed in supine   Right Knee Extension 0    Right Knee Flexion 114    Left Knee Extension -9    Left Knee Flexion 65      Strength   Right Hip Flexion 5/5     Right Hip Extension 5/5    Right Hip ABduction 5/5    Left Hip Flexion 4-/5    Left Hip Extension 4-/5    Left Hip ABduction 4/5    Right Knee Flexion 5/5    Right Knee Extension 5/5    Left Knee Flexion 4+/5    Left Knee Extension 4+/5      Flexibility   Soft Tissue Assessment /Muscle Length --   tight hip adductors Lt   Hamstrings tight Rt 92 deg Lt 63 deg    Quadriceps Rt 90 deg Lt 30 deg in prone    ITB tight Lt    Piriformis tight Lt      Palpation   Patella mobility hypomobile Lt    Palpation comment muscular tightness around Lt knee ant/post/medially; continued muscular tightness through Rt > Lt pecs; ant/lat/post cervical musculature; cervical and thoracic paraspinals; upper traps; periscapular musculature      Ambulation/Gait   Gait Comments limp Lt LE wih wt bearing Lt LE                      Objective measurements completed on examination: See above findings.       [facility] Adult PT Treatment/Exercise - 12/22/20 0001      Neck Exercises: Seated   Neck Retraction 5 reps;5 secs    Cervical Rotation Right;Left;5 reps    Lateral Flexion Right;Left;5 reps    W Back 10 reps   2 sets focus on engaging posterior shoulder girdle   Shoulder Rolls Backwards;10 reps   2 sets during treatment   Other Seated Exercise assisted cervical extension using pillowcase posterior cervical spine pulling up with UE as she extends neck 3-8 sec hold x 5 reps      Knee/Hip Exercises: Stretches   Passive Hamstring Stretch Left;2 reps;30 seconds   supine with strap   Quad Stretch Left;2 reps;30 seconds   prone with strap   Knee: Self-Stretch to increase Flexion Left;2 reps;20 seconds   with strap   Other Knee/Hip Stretches hip adductor stretch from HS stretch position with strap 30 sec x 2 reps      Shoulder Exercises: Standing   Retraction Limitations scapular depression standing push down with green TB behind back 10 reps x 2 sets  Moist Heat Therapy   Number Minutes  Moist Heat 10 Minutes    Moist Heat Location Cervical;Shoulder;Knee                  PT Education - 12/22/20 0940    Education Details HEP postural correction POC    Person(s) Educated Patient    Methods Explanation;Demonstration;Tactile cues;Verbal cues;Handout    Comprehension Verbalized understanding;Returned demonstration;Verbal cues required;Tactile cues required               PT Long Term Goals - 12/22/20 1134      PT LONG TERM GOAL #1   Title Improve posture and alignment with patient to demonstrate improved upright posture of head and neck with posterior shoulder girdle engaged    Time 6    Period Weeks    Status New    Target Date 02/02/21      PT LONG TERM GOAL #2   Title Increased cervical extension; lateral flexion; rotation by 10-15 degrees or more in all planes    Time 6    Period Weeks    Status Revised    Target Date 02/02/21      PT LONG TERM GOAL #3   Title Decrease pain with functional activities and sleeping by 50-75% allowing patient to live more normally    Time 6    Period Weeks    Status Revised    Target Date 02/02/21      PT LONG TERM GOAL #4   Title Independent in HEP including aquatic exercise as appropriate    Time 6    Period Weeks    Status Revised    Target Date 02/02/21      PT LONG TERM GOAL #5   Title Improve functional limitatioin score to 51    Time 6    Period Weeks    Status Achieved      PT LONG TERM GOAL #6   Title Increase AROM Lt knee to 100 deg flexion and 0 deg extension    Time 6    Period Weeks    Status New    Target Date 02/02/21      PT LONG TERM GOAL #7   Title Increase strength Lt hip to 5/5    Time 6    Period Weeks    Status New    Target Date 02/02/21      PT LONG TERM GOAL #8   Title Normal gait including step over step for stairs    Time 6    Period Weeks    Status New    Target Date 02/02/21                  Plan - 12/22/20 0901    Stability/Clinical Decision Making  Stable/Uncomplicated    Rehab Potential Good    PT Frequency 2x / week    PT Duration 6 weeks    PT Treatment/Interventions ADLs/Self Care Home Management;Aquatic Therapy;Cryotherapy;Electrical Stimulation;Iontophoresis 4mg /ml Dexamethasone;Moist Heat;Ultrasound;Functional mobility training;Therapeutic activities;Therapeutic exercise;Neuromuscular re-education;Patient/family education;Manual techniques;Passive range of motion;Dry needling;Taping;Traction    PT Next Visit Plan begin treatment for Lt knee; progress with postural correction and ROM exercises; continue DN/manual work; ergonomic education;  assess Lt knee (if referral received) continue DN in upper trap, pecs, scapular musculature and tape for shoulders. Continue mechanical cervical traction.    PT Home Exercise Plan [email or web address]    Consulted and Agree with Plan of Care Patient  Patient will benefit from skilled therapeutic intervention in order to improve the following deficits and impairments:  Abnormal gait,Decreased range of motion,Increased fascial restricitons,Impaired UE functional use,Decreased activity tolerance,Pain,Hypomobility,Impaired flexibility,Improper body mechanics,Decreased mobility,Postural dysfunction,Decreased strength  Visit Diagnosis: Abnormal posture  Other symptoms and signs involving the musculoskeletal system  Cervicalgia  Acute pain of left knee  Muscle weakness (generalized)  Other abnormalities of gait and mobility     Problem List Patient Active Problem List   Diagnosis Date Noted  . Chronic pain of both knees 02/25/2020  . Urinary incontinence 02/19/2019  . 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 Allergic rhinitis 11/10/2015  . Asthma 11/10/2015  . CLAUDICATION, INTERMITTENT 06/12/2010  . ANEMIA-IRON DEFICIENCY 07/20/2008  . Obesity 07/19/2008  . HYPERTENSION, BENIGN, MILD 07/19/2008    Wyonia Fontanella Nilda Simmer PT, MPH  12/22/2020, 11:41 AM  [facility] [place], [place], [address] Phone: [phone]   Fax:  [phone]  Name: Lacey Barron MRN: [id number] Date of Birth: May 05, 1956

## 2020-12-22 NOTE — Patient Instructions (Signed)
Access Code: [email or web address]: [email or web address]: 05/05/2022Prepared by: Brooklynn Brandenburg HoltExercises  Seated Scapular Retraction - 2 x daily - 7 x weekly - 1-2 sets - 10 reps - 10 sec hold  Seated Cervical Retraction - 2 x daily - 7 x weekly - 1-2 sets - 5-10 reps - 10 sec hold  Seated Cervical Sidebending AROM - 2 x daily - 7 x weekly - 1 sets - 5 reps - 5-10 sec hold  Seated Cervical Rotation AROM - 2 x daily - 7 x weekly - 1 sets - 5 reps - 2-3 sec hold  Standing Backward Shoulder Rolls - 2 x daily - 7 x weekly - 1 sets - 10 reps - 1-2 sec hold  Doorway Pec Stretch at 60 Degrees Abduction - 3 x daily - 7 x weekly - 3 reps - 1 sets  Doorway Pec Stretch at 90 Degrees Abduction - 3 x daily - 7 x weekly - 3 reps - 1 sets - 30 seconds hold  Doorway Pec Stretch at 120 Degrees Abduction - 3 x daily - 7 x weekly - 3 reps - 1 sets - 30 second hold hold  Standing Shoulder External Rotation with Resistance - 2 x daily - 7 x weekly - 1-3 sets - 10 reps - 2-3 sec hold  Standing Bilateral Low Shoulder Row with Anchored Resistance - 2 x daily - 7 x weekly - 1-3 sets - 10 reps - 2-3 sec hold  Seated Single Arm Shoulder Horizontal Abduction and Adduction - 2 x daily - 7 x weekly - 1 sets - 3 reps - 30 sec hold  Standing Shoulder Horizontal Abduction with Bent Arms and Dumbbells - 2 x daily - 7 x weekly - 1 sets - 3 reps - 30 sec hold  Shoulder Horizontal Abduction - Thumbs Up - 2 x daily - 7 x weekly - 1 sets - 3 reps - 30 sec hold  Hooklying Hamstring Stretch with Strap - 2 x daily - 7 x weekly - 1 sets - 3 reps - 30 sec hold  Single Leg Stance - 2 x daily - 7 x weekly - 2 sets - 5 reps - 20 sec hold  Gastroc Stretch on Wall - 2 x daily - 7 x weekly - 1 sets - 3 reps - 30 sec hold  Standing Gastroc Stretch - 2 x daily - 7 x weekly - 1 sets - 3 reps - 30 sec hold  Soleus Stretch on Wall - 2 x daily - 7 x weekly - 1 sets - 3 reps - 30 sec hold  Standing Gastroc Stretch - 2 x daily - 7 x weekly - 1 sets  - 3 reps - 30 sec hold  Seated Hamstring Stretch - 2 x daily - 7 x weekly - 1 sets - 3 reps - 30 sec hold  Seated Thoracic Extension and Rotation with Reach - 1 x daily - 7 x weekly - 3 sets - 10 reps  Shoulder Flexion Serratus Activation with Resistance - 1 x daily - 7 x weekly - 1 sets - 10 reps - 3-5 sec hold  Upper Cervical Extension SNAG with Strap - 2 x daily - 7 x weekly - 1 sets - 5 reps - 5-10 sec hold  Seated Shoulder W - 4-5 x daily - 7 x weekly - 1 sets - 5-10 reps - 5 sec hold  Bilateral Scapular Depression with Anchored Resistance - Straight Arm - 2 x daily - 7 x weekly -  1 sets - 10 reps - 5 sec hold  Seated Shoulder Setting - 2 x daily - 7 x weekly - 1 sets - 5-10 reps - 3-5 sec hold  Prone Quadriceps Stretch with Strap - 2 x daily - 7 x weekly - 1 sets - 3 reps - 30 sec hold  Hooklying Hamstring Stretch with Strap - 2 x daily - 7 x weekly - 1 sets - 3 reps - 30 sec hold  Hip Adductors and Hamstring Stretch with Strap - 2 x daily - 7 x weekly - 1 sets - 3 reps - 30 sec hold  Supine ITB Stretch with Strap - 2 x daily - 7 x weekly - 1 sets - 3 reps - 30 sec hold

## 2020-12-28 ENCOUNTER — Encounter: Payer: Self-pay | Admitting: Rehabilitative and Restorative Service Providers"

## 2020-12-28 ENCOUNTER — Ambulatory Visit (INDEPENDENT_AMBULATORY_CARE_PROVIDER_SITE_OTHER): Payer: PRIVATE HEALTH INSURANCE | Admitting: Rehabilitative and Restorative Service Providers"

## 2020-12-28 ENCOUNTER — Other Ambulatory Visit: Payer: Self-pay

## 2020-12-28 DIAGNOSIS — R2689 Other abnormalities of gait and mobility: Secondary | ICD-10-CM | POA: Diagnosis not present

## 2020-12-28 DIAGNOSIS — M25562 Pain in left knee: Secondary | ICD-10-CM

## 2020-12-28 DIAGNOSIS — R29898 Other symptoms and signs involving the musculoskeletal system: Secondary | ICD-10-CM

## 2020-12-28 DIAGNOSIS — M6281 Muscle weakness (generalized): Secondary | ICD-10-CM | POA: Diagnosis not present

## 2020-12-28 DIAGNOSIS — M542 Cervicalgia: Secondary | ICD-10-CM

## 2020-12-28 DIAGNOSIS — R293 Abnormal posture: Secondary | ICD-10-CM

## 2020-12-28 NOTE — Therapy (Signed)
[place], [place], [address] Phone: [phone]   Fax:  [phone]  Physical Therapy Treatment  Patient Details  Name: Lacey Barron MRN: [id number] Date of Birth: [birth year] Referring Provider (PT): Cline Crock, [place]   Encounter Date: 12/28/2020   PT End of Session - 12/28/20 0801    Visit Number 16    Number of Visits 24    Date for PT Re-Evaluation 01/26/21    Authorization - Visit Number [id number]    Authorization - Number of Visits 24    PT Start Time 0800    PT Stop Time 0925    PT Time Calculation (min) 85 min    Activity Tolerance Patient tolerated treatment well           Past Medical History:  Diagnosis Date  . Chronic knee pain    bilateral   . Complication of anesthesia    hard to wake  . GERD (gastroesophageal reflux disease)   . History of abnormal cervical Pap smear 2019  . Hypertension    followed by pcp  . IDA (iron deficiency anemia)   . Mild intermittent asthma    followed by pcp  . OA (osteoarthritis)    shoulders and knees  . PONV (postoperative nausea and vomiting)   . Sickle cell trait ([place])   . SUI (stress urinary incontinence, female)   . Uterine fibroid   . Wears glasses   . Wears partial dentures    upper and lower    Past Surgical History:  Procedure Laterality Date  . COLONOSCOPY  per pt scheduled 05-06-2020  . DILATATION & CURETTAGE/HYSTEROSCOPY WITH MYOSURE N/A 05/09/2020   Procedure: DILATATION & CURETTAGE/HYSTEROSCOPY WITH MYOSURE  RESECTION OF ENDOMETRIAL MASS;  Surgeon: Megan Salon, MD;  Location: [facility];  Service: Gynecology;  Laterality: N/A;  polyp resection  . TUBAL LIGATION Bilateral 1980s    There were no vitals filed for this visit.   Subjective Assessment - 12/28/20 0804    Subjective Doing OK this am. No pain to speak of. No radiating symptoms in the Rt UE - but she still has some neck and shoulder  pain at times. Back is tightening up to the middle. Tight in the upper traps. Knee is doing OK. She took the tape off and can tell a difference. She can tell that she is walking better without pain and limping.    Currently in Pain? No/denies    Pain Score 0-No pain    Pain Location Neck    Pain Score 0    Pain Location Knee    Pain Orientation Left                             [facility] Adult PT Treatment/Exercise - 12/28/20 0001      Therapeutic Activites    Therapeutic Activities Other Therapeutic Activities    Other Therapeutic Activities self massage with massage stick LE; upper trap/shoulder      Knee/Hip Exercises: Stretches   Passive Hamstring Stretch Left;2 reps;30 seconds   supine with strap   Quad Stretch Left;3 reps;30 seconds   prone with strap   Knee: Self-Stretch to increase Flexion Left;2 reps;20 seconds   with strap   Knee: Self-Stretch Limitations double knee to chest 15 sec x 3 reps    ITB Stretch Left;2 reps;30 seconds   supine with strap   Gastroc Stretch  Left;3 reps;30 seconds    Soleus Stretch Left;3 reps;30 seconds    Other Knee/Hip Stretches hip adductor stretch from HS stretch position with strap 30 sec x 2 reps      Knee/Hip Exercises: Aerobic   Nustep L5 x 5 min for ROM      Knee/Hip Exercises: Supine   Quad Sets Strengthening;Left;5 reps   5 sec hold   Straight Leg Raise with External Rotation Strengthening;Left;5 reps   5 sec hold     Shoulder Exercises: Supine   Other Supine Exercises prolonged snow angel coregeous ball thoracic spine x ~ 2 min      Shoulder Exercises: Standing   Extension Strengthening;Both;Theraband;10 reps    Theraband Level (Shoulder Extension) Level 3 (Green)    Row Both;20 reps;Theraband    Theraband Level (Shoulder Row) Level 3 (Green)    Row Limitations bow and arrow x 10 reps green TB    Retraction Limitations scapular depression standing push down with green TB behind back 10 reps x 2 sets      Shoulder  Exercises: ROM/Strengthening   UBE (Upper Arm Bike) L5:  2 min each forward and backward, standing    Pushups 10 reps    "W" Arms 10    Other ROM/Strengthening Exercises shoulder flexion end range facing the wall thumbs out x 10 reps      Shoulder Exercises: Stretch   Other Shoulder Stretches lower and midlevel doorway stretch x 15 sec x 3 reps; stretch with hands on door frame 2 x 15 sec    Other Shoulder Stretches at wall shoulder clock 3 pm to noon x 10; T for stretch through the pecs 20-30 sec x 3 rep each side      Moist Heat Therapy   Number Minutes Moist Heat 10 Minutes    Moist Heat Location Cervical;Shoulder;Knee;Other (comment)   posterior knee HS/gastroc     Cryotherapy   Number Minutes Cryotherapy 10 Minutes    Cryotherapy Location Knee   medial Lt   Type of Cryotherapy Ice pack      Traction   Type of Traction Cervical    Min (lbs) 16    Max (lbs) 22    Hold Time 30    Rest Time 10      Manual Therapy   Manual therapy comments Skilled palpation and monitoring of soft tissues during DN    Joint Mobilization PA and lateral mobs lower cervical upper thoracic spine Grade II to III    Soft tissue mobilization Rt/Lt upper trap, scalenes; Rt teres/triceps pt prone    Myofascial Release bilat upper traps    Other Manual Therapy to pt tolerance through Rt > Lt lateral cervical and upper trap area            Trigger Point Dry Needling - 12/28/20 0001    Consent Given? Yes    Education Handout Provided Previously provided    Other Dry Needling Rt    Upper Trapezius Response Palpable increased muscle length;Twitch reponse elicited    Levator Scapulae Response Palpable increased muscle length                     PT Long Term Goals - 12/22/20 1134      PT LONG TERM GOAL #1   Title Improve posture and alignment with patient to demonstrate improved upright posture of head and neck with posterior shoulder girdle engaged    Time 6    Period Weeks  Status  New    Target Date 02/02/21      PT LONG TERM GOAL #2   Title Increased cervical extension; lateral flexion; rotation by 10-15 degrees or more in all planes    Time 6    Period Weeks    Status Revised    Target Date 02/02/21      PT LONG TERM GOAL #3   Title Decrease pain with functional activities and sleeping by 50-75% allowing patient to live more normally    Time 6    Period Weeks    Status Revised    Target Date 02/02/21      PT LONG TERM GOAL #4   Title Independent in HEP including aquatic exercise as appropriate    Time 6    Period Weeks    Status Revised    Target Date 02/02/21      PT LONG TERM GOAL #5   Title Improve functional limitatioin score to 51    Time 6    Period Weeks    Status Achieved      PT LONG TERM GOAL #6   Title Increase AROM Lt knee to 100 deg flexion and 0 deg extension    Time 6    Period Weeks    Status New    Target Date 02/02/21      PT LONG TERM GOAL #7   Title Increase strength Lt hip to 5/5    Time 6    Period Weeks    Status New    Target Date 02/02/21      PT LONG TERM GOAL #8   Title Normal gait including step over step for stairs    Time 6    Period Weeks    Status New    Target Date 02/02/21                 Plan - 12/28/20 0809    Clinical Impression Statement Progressing well with report of decreased radicular pain in the Rt UE. She has tightness and pain in the mid back and upper trap. She has less pain in the Lt knee and is walking with better gait pattern. Added stretching and strengthening for Lt LE; strengthening posterior shoulder girdle; massage stick.    Rehab Potential Good    PT Frequency 2x / week    PT Duration 6 weeks    PT Treatment/Interventions ADLs/Self Care Home Management;Aquatic Therapy;Cryotherapy;Electrical Stimulation;Iontophoresis 4mg /ml Dexamethasone;Moist Heat;Ultrasound;Functional mobility training;Therapeutic activities;Therapeutic exercise;Neuromuscular re-education;Patient/family  education;Manual techniques;Passive range of motion;Dry needling;Taping;Traction    PT Next Visit Plan begin treatment for Lt knee; progress with postural correction and ROM exercises; continue DN/manual work; ergonomic education;  continue DN in upper trap, pecs, scapular musculature and tape for shoulders. Continue mechanical cervical traction.    PT Home Exercise Plan [email or web address]    Consulted and Agree with Plan of Care Patient           Patient will benefit from skilled therapeutic intervention in order to improve the following deficits and impairments:     Visit Diagnosis: Abnormal posture  Other symptoms and signs involving the musculoskeletal system  Cervicalgia  Acute pain of left knee  Muscle weakness (generalized)  Other abnormalities of gait and mobility     Problem List Patient Active Problem List   Diagnosis Date Noted  . Chronic pain of both knees 02/25/2020  . Urinary incontinence 02/19/2019  . 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 Allergic rhinitis 11/10/2015  . Asthma 11/10/2015  .  CLAUDICATION, INTERMITTENT 06/12/2010  . ANEMIA-IRON DEFICIENCY 07/20/2008  . Obesity 07/19/2008  . HYPERTENSION, BENIGN, MILD 07/19/2008    Lacey Barron PT, MPH  12/28/2020, 9:30 AM  [facility] [place], [place], [address] Phone: [phone]   Fax:  [phone]  Name: Lacey Barron MRN: [id number] Date of Birth: September 28, 1955

## 2021-01-02 ENCOUNTER — Encounter: Payer: Self-pay | Admitting: Rehabilitative and Restorative Service Providers"

## 2021-01-02 ENCOUNTER — Other Ambulatory Visit: Payer: Self-pay

## 2021-01-02 ENCOUNTER — Ambulatory Visit (INDEPENDENT_AMBULATORY_CARE_PROVIDER_SITE_OTHER): Payer: PRIVATE HEALTH INSURANCE | Admitting: Rehabilitative and Restorative Service Providers"

## 2021-01-02 DIAGNOSIS — R29898 Other symptoms and signs involving the musculoskeletal system: Secondary | ICD-10-CM | POA: Diagnosis not present

## 2021-01-02 DIAGNOSIS — R293 Abnormal posture: Secondary | ICD-10-CM

## 2021-01-02 DIAGNOSIS — M25562 Pain in left knee: Secondary | ICD-10-CM | POA: Diagnosis not present

## 2021-01-02 DIAGNOSIS — R2689 Other abnormalities of gait and mobility: Secondary | ICD-10-CM

## 2021-01-02 DIAGNOSIS — M6281 Muscle weakness (generalized): Secondary | ICD-10-CM

## 2021-01-02 DIAGNOSIS — M542 Cervicalgia: Secondary | ICD-10-CM | POA: Diagnosis not present

## 2021-01-02 NOTE — Therapy (Signed)
[place], [place], [address] Phone: [phone]   Fax:  [phone]  Physical Therapy Treatment  Patient Details  Name: Lacey Barron MRN: [id number] Date of Birth: 1955/11/22 Referring Provider (PT): Cline Crock, [place]   Encounter Date: 01/02/2021   PT End of Session - 01/02/21 0802    Visit Number 17    Number of Visits 24    Date for PT Re-Evaluation 01/26/21    Authorization - Visit Number [id number]    Authorization - Number of Visits 24    PT Start Time 6237    PT Stop Time 0925    PT Time Calculation (min) 86 min    Activity Tolerance Patient tolerated treatment well           Past Medical History:  Diagnosis Date  . Chronic knee pain    bilateral   . Complication of anesthesia    hard to wake  . GERD (gastroesophageal reflux disease)   . History of abnormal cervical Pap smear 2019  . Hypertension    followed by pcp  . IDA (iron deficiency anemia)   . Mild intermittent asthma    followed by pcp  . OA (osteoarthritis)    shoulders and knees  . PONV (postoperative nausea and vomiting)   . Sickle cell trait ([place])   . SUI (stress urinary incontinence, female)   . Uterine fibroid   . Wears glasses   . Wears partial dentures    upper and lower    Past Surgical History:  Procedure Laterality Date  . COLONOSCOPY  per pt scheduled 05-06-2020  . DILATATION & CURETTAGE/HYSTEROSCOPY WITH MYOSURE N/A 05/09/2020   Procedure: DILATATION & CURETTAGE/HYSTEROSCOPY WITH MYOSURE  RESECTION OF ENDOMETRIAL MASS;  Surgeon: Megan Salon, MD;  Location: [facility];  Service: Gynecology;  Laterality: N/A;  polyp resection  . TUBAL LIGATION Bilateral 1980s    There were no vitals filed for this visit.   Subjective Assessment - 01/02/21 0802    Subjective Patient reports that she is doing "okay". She did a lot of walking over the weekend for her daughter's graduation and  her knee is sore. Has it taped today which helps. Found a massage stick and a stretch out strap and both have been helpful.    Currently in Pain? Yes    Pain Score 1     Pain Location Neck    Pain Orientation Right    Pain Descriptors / Indicators Tightness    Pain Type Acute pain    Pain Score 1    Pain Location Knee    Pain Orientation Left    Pain Descriptors / Indicators Tightness;Shooting    Pain Type Chronic pain              [facility] PT Assessment - 01/02/21 0001      Assessment   Medical Diagnosis Cervicalgia; Lt knee pain    Referring Provider (PT) Cline Crock, PA-C    Onset Date/Surgical Date 09/29/20    Hand Dominance Right    Next MD Visit 02/05/21    Prior Therapy none      Sensation   Additional Comments minimal occurance of intermittent tingling LT triceps area no longer into hands      AROM   Cervical Extension 13      Flexibility   Hamstrings tight Rt 92 deg Lt 85 deg    Quadriceps Rt 90 deg Lt 75  deg in prone    ITB tight Lt    Piriformis tight Lt                         [facility] Adult PT Treatment/Exercise - 01/02/21 0001      Knee/Hip Exercises: Stretches   Passive Hamstring Stretch Left;2 reps;30 seconds   supine with strap   Quad Stretch Left;3 reps;30 seconds   prone with strap   Knee: Self-Stretch to increase Flexion Left;2 reps;20 seconds   with strap   Knee: Self-Stretch Limitations double knee to chest 15 sec x 3 reps    ITB Stretch Left;2 reps;30 seconds   supine with strap   Gastroc Stretch Left;3 reps;30 seconds    Soleus Stretch Left;3 reps;30 seconds    Other Knee/Hip Stretches hip adductor stretch from HS stretch position with strap 30 sec x 2 reps      Knee/Hip Exercises: Aerobic   Nustep L6 x 5 min UE's 10      Knee/Hip Exercises: Standing   Lateral Step Up Left;10 reps;Hand Hold: 1;Step Height: 4"    Forward Step Up Left;10 reps;Hand Hold: 2;Step Height: 4"    Functional Squat 10 reps   mini squat   SLS  Lt/Rt x 20 sec x 3 reps each side      Shoulder Exercises: Standing   Flexion Strengthening;Both;5 reps;Theraband   activation of serratus anterior sliding arms up wall TB at wrists   Theraband Level (Shoulder Flexion) Level 2 (Red)    Row Both;20 reps;Theraband    Theraband Level (Shoulder Row) Level 3 (Green)    Row Limitations bow and arrow x 10 reps green TB    Retraction Limitations scapular depression standing push down with green TB behind back 10 reps x 2 sets      Shoulder Exercises: ROM/Strengthening   UBE (Upper Arm Bike) L5 x 5 min alternating forward and backward, standing    Pushups 10 reps    "W" Arms 10    Other ROM/Strengthening Exercises shoulder flexion end range facing the wall thumbs out x 10 reps    Other ROM/Strengthening Exercises shoulder flexion rolling dowel up doorframe x 5 reps      Shoulder Exercises: Stretch   Other Shoulder Stretches lower and midlevel doorway stretch x 15 sec x 3 reps; stretch with hands on door frame 2 x 15 sec; moving to slightly higher position for doorway holding ~ 10 sec x 3 reps    Other Shoulder Stretches at wall shoulder clock 3 pm to noon x 10; T for stretch through the pecs 20-30 sec x 3 rep each side      Moist Heat Therapy   Number Minutes Moist Heat 15 Minutes    Moist Heat Location Cervical;Shoulder;Knee;Other (comment)   posterior knee HS/gastroc     Cryotherapy   Number Minutes Cryotherapy 15 Minutes    Cryotherapy Location Knee   medial Lt   Type of Cryotherapy Ice pack      Traction   Type of Traction Cervical    Min (lbs) 16    Max (lbs) 22    Hold Time 30    Rest Time 10    Time 15                  PT Education - 01/02/21 0844    Person(s) Educated Patient    Methods Explanation;Demonstration;Tactile cues;Verbal cues;Handout    Comprehension Verbalized understanding;Returned demonstration;Verbal cues required;Tactile cues  required               PT Long Term Goals - 12/22/20 1134      PT  LONG TERM GOAL #1   Title Improve posture and alignment with patient to demonstrate improved upright posture of head and neck with posterior shoulder girdle engaged    Time 6    Period Weeks    Status New    Target Date 02/02/21      PT LONG TERM GOAL #2   Title Increased cervical extension; lateral flexion; rotation by 10-15 degrees or more in all planes    Time 6    Period Weeks    Status Revised    Target Date 02/02/21      PT LONG TERM GOAL #3   Title Decrease pain with functional activities and sleeping by 50-75% allowing patient to live more normally    Time 6    Period Weeks    Status Revised    Target Date 02/02/21      PT LONG TERM GOAL #4   Title Independent in HEP including aquatic exercise as appropriate    Time 6    Period Weeks    Status Revised    Target Date 02/02/21      PT LONG TERM GOAL #5   Title Improve functional limitatioin score to 51    Time 6    Period Weeks    Status Achieved      PT LONG TERM GOAL #6   Title Increase AROM Lt knee to 100 deg flexion and 0 deg extension    Time 6    Period Weeks    Status New    Target Date 02/02/21      PT LONG TERM GOAL #7   Title Increase strength Lt hip to 5/5    Time 6    Period Weeks    Status New    Target Date 02/02/21      PT LONG TERM GOAL #8   Title Normal gait including step over step for stairs    Time 6    Period Weeks    Status New    Target Date 02/02/21                 Plan - 01/02/21 0805    Clinical Impression Statement Positive response even after a busy weekend patient reports soreness and tightness but no increase in pain and she did not have to take any pain medication. Working on exercises at home. Progressed with exercises for U/LE's. Note increased mobility in Lt LE; incresaed exercise tolerance in U/LE's. Good gains in ROM Lt LE; continued tightness in cervical extension. Very minimal radicular tingling Rt UE and occurs infrequently.    Rehab Potential Good    PT  Frequency 2x / week    PT Duration 6 weeks    PT Treatment/Interventions ADLs/Self Care Home Management;Aquatic Therapy;Cryotherapy;Electrical Stimulation;Iontophoresis 4mg /ml Dexamethasone;Moist Heat;Ultrasound;Functional mobility training;Therapeutic activities;Therapeutic exercise;Neuromuscular re-education;Patient/family education;Manual techniques;Passive range of motion;Dry needling;Taping;Traction    PT Next Visit Plan continue treatment for Lt knee; progress with postural correction and ROM exercises; continue DN/manual work; ergonomic education;  continue DN in upper trap, pecs, scapular musculature and tape for shoulders. Continue mechanical cervical traction; manual work, mobilization and DN to address cervical tightnes.    PT Home Exercise Plan [email or web address]    Consulted and Agree with Plan of Care Patient           Patient will  benefit from skilled therapeutic intervention in order to improve the following deficits and impairments:     Visit Diagnosis: Abnormal posture  Other symptoms and signs involving the musculoskeletal system  Cervicalgia  Acute pain of left knee  Muscle weakness (generalized)  Other abnormalities of gait and mobility     Problem List Patient Active Problem List   Diagnosis Date Noted  . Chronic pain of both knees 02/25/2020  . Urinary incontinence 02/19/2019  . 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 Allergic rhinitis 11/10/2015  . Asthma 11/10/2015  . CLAUDICATION, INTERMITTENT 06/12/2010  . ANEMIA-IRON DEFICIENCY 07/20/2008  . Obesity 07/19/2008  . HYPERTENSION, BENIGN, MILD 07/19/2008    Tiberius Loftus Nilda Simmer PT, MPH  01/02/2021, 9:09 AM  [facility] [place], [place], [address] Phone: [phone]   Fax:  [phone]  Name: Lacey Barron MRN: [id number] Date of Birth: [birth year]

## 2021-01-02 NOTE — Patient Instructions (Signed)
Access Code: [email or web address]: [email or web address]: 05/16/2022Prepared by: Jehiel Koepp HoltExercises  Seated Scapular Retraction - 2 x daily - 7 x weekly - 1-2 sets - 10 reps - 10 sec hold  Seated Cervical Retraction - 2 x daily - 7 x weekly - 1-2 sets - 5-10 reps - 10 sec hold  Seated Cervical Sidebending AROM - 2 x daily - 7 x weekly - 1 sets - 5 reps - 5-10 sec hold  Seated Cervical Rotation AROM - 2 x daily - 7 x weekly - 1 sets - 5 reps - 2-3 sec hold  Standing Backward Shoulder Rolls - 2 x daily - 7 x weekly - 1 sets - 10 reps - 1-2 sec hold  Doorway Pec Stretch at 60 Degrees Abduction - 3 x daily - 7 x weekly - 3 reps - 1 sets  Doorway Pec Stretch at 90 Degrees Abduction - 3 x daily - 7 x weekly - 3 reps - 1 sets - 30 seconds hold  Doorway Pec Stretch at 120 Degrees Abduction - 3 x daily - 7 x weekly - 3 reps - 1 sets - 30 second hold hold  Standing Shoulder External Rotation with Resistance - 2 x daily - 7 x weekly - 1-3 sets - 10 reps - 2-3 sec hold  Standing Bilateral Low Shoulder Row with Anchored Resistance - 2 x daily - 7 x weekly - 1-3 sets - 10 reps - 2-3 sec hold  Seated Single Arm Shoulder Horizontal Abduction and Adduction - 2 x daily - 7 x weekly - 1 sets - 3 reps - 30 sec hold  Standing Shoulder Horizontal Abduction with Bent Arms and Dumbbells - 2 x daily - 7 x weekly - 1 sets - 3 reps - 30 sec hold  Shoulder Horizontal Abduction - Thumbs Up - 2 x daily - 7 x weekly - 1 sets - 3 reps - 30 sec hold  Hooklying Hamstring Stretch with Strap - 2 x daily - 7 x weekly - 1 sets - 3 reps - 30 sec hold  Single Leg Stance - 2 x daily - 7 x weekly - 2 sets - 5 reps - 20 sec hold  Gastroc Stretch on Wall - 2 x daily - 7 x weekly - 1 sets - 3 reps - 30 sec hold  Standing Gastroc Stretch - 2 x daily - 7 x weekly - 1 sets - 3 reps - 30 sec hold  Soleus Stretch on Wall - 2 x daily - 7 x weekly - 1 sets - 3 reps - 30 sec hold  Standing Gastroc Stretch - 2 x daily - 7 x weekly - 1 sets  - 3 reps - 30 sec hold  Seated Hamstring Stretch - 2 x daily - 7 x weekly - 1 sets - 3 reps - 30 sec hold  Seated Thoracic Extension and Rotation with Reach - 1 x daily - 7 x weekly - 3 sets - 10 reps  Shoulder Flexion Serratus Activation with Resistance - 1 x daily - 7 x weekly - 1 sets - 10 reps - 3-5 sec hold  Upper Cervical Extension SNAG with Strap - 2 x daily - 7 x weekly - 1 sets - 5 reps - 5-10 sec hold  Seated Shoulder W - 4-5 x daily - 7 x weekly - 1 sets - 5-10 reps - 5 sec hold  Bilateral Scapular Depression with Anchored Resistance - Straight Arm - 2 x daily - 7 x weekly -  1 sets - 10 reps - 5 sec hold  Seated Shoulder Setting - 2 x daily - 7 x weekly - 1 sets - 5-10 reps - 3-5 sec hold  Hooklying Hamstring Stretch with Strap - 2 x daily - 7 x weekly - 1 sets - 3 reps - 30 sec hold  Hip Adductors and Hamstring Stretch with Strap - 2 x daily - 7 x weekly - 1 sets - 3 reps - 30 sec hold  Supine ITB Stretch with Strap - 2 x daily - 7 x weekly - 1 sets - 3 reps - 30 sec hold  Standing Shoulder Extension with Resistance - 2 x daily - 7 x weekly - 1-2 sets - 10 reps - 3 sec hold  Supine Double Knee to Chest - 2 x daily - 7 x weekly - 1 sets - 3 reps - 10-15 sec hold  Supine Quad Set - 2 x daily - 7 x weekly - 1 sets - 10 reps - 3 sec hold  Straight Leg Raise with External Rotation - 2 x daily - 7 x weekly - 1 sets - 10 reps - 3-5 sec hold  Prone Quadriceps Stretch with Strap - 2 x daily - 7 x weekly - 1 sets - 3 reps - 30 sec hold  Step Up - 2 x daily - 7 x weekly - 1 sets - 10 reps - 2 sec hold  Lateral Step Up - 2 x daily - 7 x weekly - 2 sets - 10 reps - 2 sec hold  Single Leg Stance - 2 x daily - 7 x weekly - 2 sets - 5 reps - 20 sec hold  Shoulder Flexion Serratus Activation with Resistance - 1 x daily - 7 x weekly - 1 sets - 10 reps - 3-5 sec hold

## 2021-01-03 ENCOUNTER — Encounter: Payer: 59 | Admitting: Rehabilitative and Restorative Service Providers"

## 2021-01-06 ENCOUNTER — Ambulatory Visit (INDEPENDENT_AMBULATORY_CARE_PROVIDER_SITE_OTHER): Payer: PRIVATE HEALTH INSURANCE | Admitting: Rehabilitative and Restorative Service Providers"

## 2021-01-06 ENCOUNTER — Other Ambulatory Visit: Payer: Self-pay

## 2021-01-06 ENCOUNTER — Encounter: Payer: Self-pay | Admitting: Rehabilitative and Restorative Service Providers"

## 2021-01-06 DIAGNOSIS — M6281 Muscle weakness (generalized): Secondary | ICD-10-CM

## 2021-01-06 DIAGNOSIS — M542 Cervicalgia: Secondary | ICD-10-CM | POA: Diagnosis not present

## 2021-01-06 DIAGNOSIS — R293 Abnormal posture: Secondary | ICD-10-CM

## 2021-01-06 DIAGNOSIS — M25562 Pain in left knee: Secondary | ICD-10-CM

## 2021-01-06 DIAGNOSIS — R29898 Other symptoms and signs involving the musculoskeletal system: Secondary | ICD-10-CM

## 2021-01-06 DIAGNOSIS — R2689 Other abnormalities of gait and mobility: Secondary | ICD-10-CM

## 2021-01-06 NOTE — Patient Instructions (Signed)
Access Code: [email or web address]: [email or web address]: 05/20/2022Prepared by: Destany Severns HoltExercises  Seated Scapular Retraction - 2 x daily - 7 x weekly - 1-2 sets - 10 reps - 10 sec hold  Seated Cervical Retraction - 2 x daily - 7 x weekly - 1-2 sets - 5-10 reps - 10 sec hold  Seated Cervical Sidebending AROM - 2 x daily - 7 x weekly - 1 sets - 5 reps - 5-10 sec hold  Seated Cervical Rotation AROM - 2 x daily - 7 x weekly - 1 sets - 5 reps - 2-3 sec hold  Standing Backward Shoulder Rolls - 2 x daily - 7 x weekly - 1 sets - 10 reps - 1-2 sec hold  Doorway Pec Stretch at 60 Degrees Abduction - 3 x daily - 7 x weekly - 3 reps - 1 sets  Doorway Pec Stretch at 90 Degrees Abduction - 3 x daily - 7 x weekly - 3 reps - 1 sets - 30 seconds hold  Doorway Pec Stretch at 120 Degrees Abduction - 3 x daily - 7 x weekly - 3 reps - 1 sets - 30 second hold hold  Standing Shoulder External Rotation with Resistance - 2 x daily - 7 x weekly - 1-3 sets - 10 reps - 2-3 sec hold  Standing Bilateral Low Shoulder Row with Anchored Resistance - 2 x daily - 7 x weekly - 1-3 sets - 10 reps - 2-3 sec hold  Seated Single Arm Shoulder Horizontal Abduction and Adduction - 2 x daily - 7 x weekly - 1 sets - 3 reps - 30 sec hold  Standing Shoulder Horizontal Abduction with Bent Arms and Dumbbells - 2 x daily - 7 x weekly - 1 sets - 3 reps - 30 sec hold  Shoulder Horizontal Abduction - Thumbs Up - 2 x daily - 7 x weekly - 1 sets - 3 reps - 30 sec hold  Hooklying Hamstring Stretch with Strap - 2 x daily - 7 x weekly - 1 sets - 3 reps - 30 sec hold  Single Leg Stance - 2 x daily - 7 x weekly - 2 sets - 5 reps - 20 sec hold  Gastroc Stretch on Wall - 2 x daily - 7 x weekly - 1 sets - 3 reps - 30 sec hold  Standing Gastroc Stretch - 2 x daily - 7 x weekly - 1 sets - 3 reps - 30 sec hold  Soleus Stretch on Wall - 2 x daily - 7 x weekly - 1 sets - 3 reps - 30 sec hold  Standing Gastroc Stretch - 2 x daily - 7 x weekly - 1 sets  - 3 reps - 30 sec hold  Seated Hamstring Stretch - 2 x daily - 7 x weekly - 1 sets - 3 reps - 30 sec hold  Seated Thoracic Extension and Rotation with Reach - 1 x daily - 7 x weekly - 3 sets - 10 reps  Shoulder Flexion Serratus Activation with Resistance - 1 x daily - 7 x weekly - 1 sets - 10 reps - 3-5 sec hold  Upper Cervical Extension SNAG with Strap - 2 x daily - 7 x weekly - 1 sets - 5 reps - 5-10 sec hold  Seated Shoulder W - 4-5 x daily - 7 x weekly - 1 sets - 5-10 reps - 5 sec hold  Bilateral Scapular Depression with Anchored Resistance - Straight Arm - 2 x daily - 7 x weekly -  1 sets - 10 reps - 5 sec hold  Seated Shoulder Setting - 2 x daily - 7 x weekly - 1 sets - 5-10 reps - 3-5 sec hold  Hooklying Hamstring Stretch with Strap - 2 x daily - 7 x weekly - 1 sets - 3 reps - 30 sec hold  Hip Adductors and Hamstring Stretch with Strap - 2 x daily - 7 x weekly - 1 sets - 3 reps - 30 sec hold  Supine ITB Stretch with Strap - 2 x daily - 7 x weekly - 1 sets - 3 reps - 30 sec hold  Standing Shoulder Extension with Resistance - 2 x daily - 7 x weekly - 1-2 sets - 10 reps - 3 sec hold  Supine Double Knee to Chest - 2 x daily - 7 x weekly - 1 sets - 3 reps - 10-15 sec hold  Supine Quad Set - 2 x daily - 7 x weekly - 1 sets - 10 reps - 3 sec hold  Straight Leg Raise with External Rotation - 2 x daily - 7 x weekly - 1 sets - 10 reps - 3-5 sec hold  Prone Quadriceps Stretch with Strap - 2 x daily - 7 x weekly - 1 sets - 3 reps - 30 sec hold  Step Up - 2 x daily - 7 x weekly - 1 sets - 10 reps - 2 sec hold  Lateral Step Up - 2 x daily - 7 x weekly - 2 sets - 10 reps - 2 sec hold  Single Leg Stance - 2 x daily - 7 x weekly - 2 sets - 5 reps - 20 sec hold  Shoulder Flexion Serratus Activation with Resistance - 1 x daily - 7 x weekly - 1 sets - 10 reps - 3-5 sec hold  Prone Scapular Retraction - 2 x daily - 7 x weekly - 1 sets - 5-10 reps - 3-5 sec hold  Wall Quarter Squat - 2 x daily - 7 x weekly -  1-2 sets - 10 reps - 5-10 sec hold

## 2021-01-06 NOTE — Therapy (Addendum)
[place], [place], [address] Phone: [phone]   Fax:  [phone]  Physical Therapy Treatment  Patient Details  Name: Lacey Barron MRN: [id number] Date of Birth: Aug 13, 1956 Referring Provider (PT): Cline Crock, [place]   Encounter Date: 01/06/2021     PT End of Session - 01/06/21 0806    Visit Number 18    Number of Visits 24    Date for PT Re-Evaluation 01/26/21    PT Start Time 0759    PT Stop Time 0926    PT Time Calculation (min) 87 min    Activity Tolerance Patient tolerated treatment well           Past Medical History:  Diagnosis Date  . Chronic knee pain    bilateral   . Complication of anesthesia    hard to wake  . GERD (gastroesophageal reflux disease)   . History of abnormal cervical Pap smear 2019  . Hypertension    followed by pcp  . IDA (iron deficiency anemia)   . Mild intermittent asthma    followed by pcp  . OA (osteoarthritis)    shoulders and knees  . PONV (postoperative nausea and vomiting)   . Sickle cell trait ([place])   . SUI (stress urinary incontinence, female)   . Uterine fibroid   . Wears glasses   . Wears partial dentures    upper and lower    Past Surgical History:  Procedure Laterality Date  . COLONOSCOPY  per pt scheduled 05-06-2020  . DILATATION & CURETTAGE/HYSTEROSCOPY WITH MYOSURE N/A 05/09/2020   Procedure: DILATATION & CURETTAGE/HYSTEROSCOPY WITH MYOSURE  RESECTION OF ENDOMETRIAL MASS;  Surgeon: Megan Salon, MD;  Location: [facility];  Service: Gynecology;  Laterality: N/A;  polyp resection  . TUBAL LIGATION Bilateral 1980s    There were no vitals filed for this visit.   Subjective Assessment - 01/06/21 0806    Subjective Feeling okay. Took the tape off her knee this morning. Will see how it does. Working on her exercises at home.    Currently in Pain? No/denies    Pain Score 0-No pain    Pain Location  Neck    Pain Score 0    Pain Location Knee    Pain Orientation Left                             [facility] Adult PT Treatment/Exercise - 01/06/21 0001      Knee/Hip Exercises: Aerobic   Nustep L6 x 6 min UE's 10      Knee/Hip Exercises: Standing   Lateral Step Up Left;10 reps;Hand Hold: 1;Step Height: 4"    Forward Step Up Left;10 reps;Hand Hold: 2;Step Height: 4";2 sets    Functional Squat 10 reps   mini squat   Wall Squat 10 reps;10 seconds    SLS Lt/Rt x 20 sec x 4 reps each side      Shoulder Exercises: Prone   Retraction Strengthening;5 reps    Retraction Limitations axial extension with scap squeeze 10 sec hold x 5 reps      Shoulder Exercises: Standing   Retraction Limitations scapular depression standing push down with green TB behind back 10 reps x 2 sets    Other Standing Exercises scap squeeze staning foam roll along spine      Shoulder Exercises: ROM/Strengthening   UBE (Upper Arm Bike) L5 x 5 min  alternating forward and backward, standing    Other ROM/Strengthening Exercises shoulder flexion end range facing the wall thumbs out x 10 reps    Other ROM/Strengthening Exercises shoulder flexion rolling dowel up doorframe x 5 reps      Shoulder Exercises: Stretch   Wall Stretch - Flexion Limitations shoulder flexion holding dowel in doorway for increased shoulder flexion 10 sec hold x 3 reps to pt tolerance    Other Shoulder Stretches lower and midlevel doorway stretch x 15 sec x 3 reps; stretch with hands on door frame 2 x 15 sec; moving to slightly higher position for doorway holding ~ 10 sec x 3 reps    Other Shoulder Stretches at wall shoulder clock 3 pm to noon x 10; T for stretch through the pecs 20-30 sec x 3 rep each side      Moist Heat Therapy   Number Minutes Moist Heat 15 Minutes    Moist Heat Location Cervical;Shoulder;Knee;Other (comment)   posterior knee HS/gastroc     Cryotherapy   Number Minutes Cryotherapy 15 Minutes    Cryotherapy  Location Knee   medial Lt   Type of Cryotherapy Ice pack      Traction   Type of Traction Cervical   table higher to provide straighter pull   Min (lbs) 16    Max (lbs) 22    Hold Time 30    Rest Time 10    Time 15      Manual Therapy   Manual Therapy --   pt prone and supine   Joint Mobilization PA and lateral mobs lower cervical upper thoracic spine Grade II to III    Soft tissue mobilization Rt/Lt upper trap, scalenes; Rt teres/triceps pt prone    Myofascial Release posterior cervical    Manual Traction manual cervical traction 20-30 sec hold x 3 reps; traction through the long arm    Other Manual Therapy using pillow case to assist with cervical extension pt in sitting assisting with the pillow case then PT assist with cervical extension using pillow case x 5-6 reps                  PT Education - 01/06/21 0845    Education Details HEP    Person(s) Educated Patient    Methods Explanation;Demonstration;Tactile cues;Verbal cues;Handout    Comprehension Verbalized understanding;Returned demonstration;Verbal cues required;Tactile cues required               PT Long Term Goals - 12/22/20 1134      PT LONG TERM GOAL #1   Title Improve posture and alignment with patient to demonstrate improved upright posture of head and neck with posterior shoulder girdle engaged    Time 6    Period Weeks    Status New    Target Date 02/02/21      PT LONG TERM GOAL #2   Title Increased cervical extension; lateral flexion; rotation by 10-15 degrees or more in all planes    Time 6    Period Weeks    Status Revised    Target Date 02/02/21      PT LONG TERM GOAL #3   Title Decrease pain with functional activities and sleeping by 50-75% allowing patient to live more normally    Time 6    Period Weeks    Status Revised    Target Date 02/02/21      PT LONG TERM GOAL #4   Title Independent in HEP including aquatic  exercise as appropriate    Time 6    Period Weeks    Status  Revised    Target Date 02/02/21      PT LONG TERM GOAL #5   Title Improve functional limitatioin score to 51    Time 6    Period Weeks    Status Achieved      PT LONG TERM GOAL #6   Title Increase AROM Lt knee to 100 deg flexion and 0 deg extension    Time 6    Period Weeks    Status New    Target Date 02/02/21      PT LONG TERM GOAL #7   Title Increase strength Lt hip to 5/5    Time 6    Period Weeks    Status New    Target Date 02/02/21      PT LONG TERM GOAL #8   Title Normal gait including step over step for stairs    Time 6    Period Weeks    Status New    Target Date 02/02/21                 Plan - 01/06/21 0808    Clinical Impression Statement Continued gradual porgress with cervical dysfunction and Lt knee pain. Some continued intermittent pain and tightness persists but patient is now having periods of time with no pain, Radicular pain Rt UE aboout 3 times a week but pt can resolve symptoms with exercises and postural changes. Taking less medication. Progressing well toward stated goals of therapy with cervical dysfunction and with Lt knee pain. Good soft tissue release in the cervical and shoulder girdle area with manual work    Rehab Potential Good    PT Frequency 2x / week    PT Duration 6 weeks    PT Treatment/Interventions ADLs/Self Care Home Management;Aquatic Therapy;Cryotherapy;Electrical Stimulation;Iontophoresis 4mg /ml Dexamethasone;Moist Heat;Ultrasound;Functional mobility training;Therapeutic activities;Therapeutic exercise;Neuromuscular re-education;Patient/family education;Manual techniques;Passive range of motion;Dry needling;Taping;Traction    PT Next Visit Plan continue treatment for Lt knee; progress with postural correction and ROM exercises; continue DN/manual work; ergonomic education;  continue DN in upper trap, pecs, scapular musculature and tape for shoulders. Continue mechanical cervical traction; manual work, mobilization and DN to  address cervical tightnes.    PT Home Exercise Plan [email or web address]    Consulted and Agree with Plan of Care Patient           Patient will benefit from skilled therapeutic intervention in order to improve the following deficits and impairments:     Visit Diagnosis: Abnormal posture  Other symptoms and signs involving the musculoskeletal system  Cervicalgia  Acute pain of left knee  Muscle weakness (generalized)  Other abnormalities of gait and mobility     Problem List Patient Active Problem List   Diagnosis Date Noted  . Chronic pain of both knees 02/25/2020  . Urinary incontinence 02/19/2019  . 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 Allergic rhinitis 11/10/2015  . Asthma 11/10/2015  . CLAUDICATION, INTERMITTENT 06/12/2010  . ANEMIA-IRON DEFICIENCY 07/20/2008  . Obesity 07/19/2008  . HYPERTENSION, BENIGN, MILD 07/19/2008    Minetta Krisher Nilda Simmer PT, MPH  01/06/2021, 9:29 AM  [facility] [place], [place], [address] Phone: [phone]   Fax:  [phone]  Name: Lacey Barron MRN: [id number] Date of Birth: 06-Feb-1956

## 2021-01-08 ENCOUNTER — Telehealth: Payer: 59 | Admitting: Physician Assistant

## 2021-01-08 DIAGNOSIS — Z20822 Contact with and (suspected) exposure to covid-19: Secondary | ICD-10-CM

## 2021-01-08 DIAGNOSIS — R079 Chest pain, unspecified: Secondary | ICD-10-CM

## 2021-01-08 DIAGNOSIS — R059 Cough, unspecified: Secondary | ICD-10-CM

## 2021-01-08 NOTE — Progress Notes (Signed)
Based on what you shared with me, I feel your condition warrants further evaluation and I recommend that you be seen in a face to face office visit.  Ms. Lacey Barron,  Due to your symptom of chest pain, I advise that you have a face to face visit for further evaluation of your symptoms.    NOTE: If you entered your credit card information for this eVisit, you will not be charged. You may see a "hold" on your card for the $35 but that hold will drop off and you will not have a charge processed.   If you are having a true medical emergency please call 911.      For an urgent face to face visit, [place] has six urgent care centers for your convenience:     [facility] [place] [facility] [place] Get Driving Directions [phone] [place], [place] [address] . 8 am - 4 pm Monday - Friday    [facility] [place] [facility]) Get Driving Directions [phone] [address] [place], [place] [address] . 8 am to 8 pm Monday-Friday . 10 am to 6 pm [facility]) Get Driving Directions [phone]  [address] [place],  [place]  [address] . 8 am to 8 pm Monday-Friday . 8 am to 4 pm [facility] at [facility] Get Driving Directions [phone] [place], [place], [place] [address] . 8 am to 8 pm Monday-Friday . 8 am to 4 pm [facility] at [facility] [place] Get Driving Directions  [phone] [address] [place],  [address] . 8 am to 8 pm Monday-Friday . 8 am to 4 pm [facility] at [place] Get Driving Directions [phone] [address], [place],  [address] . 8 am to 8 pm Monday-Friday . 8 am to 4 pm Saturday-Sunday     Your MyChart E-visit questionnaire answers were reviewed by a board certified  advanced clinical practitioner to complete your personal care plan based on your specific symptoms.  Thank you for using e-Visits.    I spent 5-10 minutes on review and completion of this note- [name] [facility]

## 2021-01-10 ENCOUNTER — Other Ambulatory Visit: Payer: Self-pay

## 2021-01-10 ENCOUNTER — Ambulatory Visit (INDEPENDENT_AMBULATORY_CARE_PROVIDER_SITE_OTHER): Payer: PRIVATE HEALTH INSURANCE | Admitting: Rehabilitative and Restorative Service Providers"

## 2021-01-10 ENCOUNTER — Encounter: Payer: Self-pay | Admitting: Rehabilitative and Restorative Service Providers"

## 2021-01-10 DIAGNOSIS — R2689 Other abnormalities of gait and mobility: Secondary | ICD-10-CM

## 2021-01-10 DIAGNOSIS — R293 Abnormal posture: Secondary | ICD-10-CM

## 2021-01-10 DIAGNOSIS — R29898 Other symptoms and signs involving the musculoskeletal system: Secondary | ICD-10-CM | POA: Diagnosis not present

## 2021-01-10 DIAGNOSIS — M25562 Pain in left knee: Secondary | ICD-10-CM | POA: Diagnosis not present

## 2021-01-10 DIAGNOSIS — M542 Cervicalgia: Secondary | ICD-10-CM

## 2021-01-10 DIAGNOSIS — M6281 Muscle weakness (generalized): Secondary | ICD-10-CM

## 2021-01-10 NOTE — Patient Instructions (Signed)
Access Code: [email or web address]: [email or web address]: 05/24/2022Prepared by: Jalayia Bagheri HoltExercises  Seated Scapular Retraction - 2 x daily - 7 x weekly - 1-2 sets - 10 reps - 10 sec hold  Seated Cervical Retraction - 2 x daily - 7 x weekly - 1-2 sets - 5-10 reps - 10 sec hold  Seated Cervical Sidebending AROM - 2 x daily - 7 x weekly - 1 sets - 5 reps - 5-10 sec hold  Seated Cervical Rotation AROM - 2 x daily - 7 x weekly - 1 sets - 5 reps - 2-3 sec hold  Standing Backward Shoulder Rolls - 2 x daily - 7 x weekly - 1 sets - 10 reps - 1-2 sec hold  Doorway Pec Stretch at 60 Degrees Abduction - 3 x daily - 7 x weekly - 3 reps - 1 sets  Doorway Pec Stretch at 90 Degrees Abduction - 3 x daily - 7 x weekly - 3 reps - 1 sets - 30 seconds hold  Doorway Pec Stretch at 120 Degrees Abduction - 3 x daily - 7 x weekly - 3 reps - 1 sets - 30 second hold hold  Standing Shoulder External Rotation with Resistance - 2 x daily - 7 x weekly - 1-3 sets - 10 reps - 2-3 sec hold  Standing Bilateral Low Shoulder Row with Anchored Resistance - 2 x daily - 7 x weekly - 1-3 sets - 10 reps - 2-3 sec hold  Seated Single Arm Shoulder Horizontal Abduction and Adduction - 2 x daily - 7 x weekly - 1 sets - 3 reps - 30 sec hold  Standing Shoulder Horizontal Abduction with Bent Arms and Dumbbells - 2 x daily - 7 x weekly - 1 sets - 3 reps - 30 sec hold  Shoulder Horizontal Abduction - Thumbs Up - 2 x daily - 7 x weekly - 1 sets - 3 reps - 30 sec hold  Hooklying Hamstring Stretch with Strap - 2 x daily - 7 x weekly - 1 sets - 3 reps - 30 sec hold  Single Leg Stance - 2 x daily - 7 x weekly - 2 sets - 5 reps - 20 sec hold  Gastroc Stretch on Wall - 2 x daily - 7 x weekly - 1 sets - 3 reps - 30 sec hold  Standing Gastroc Stretch - 2 x daily - 7 x weekly - 1 sets - 3 reps - 30 sec hold  Soleus Stretch on Wall - 2 x daily - 7 x weekly - 1 sets - 3 reps - 30 sec hold  Standing Gastroc Stretch - 2 x daily - 7 x weekly - 1 sets  - 3 reps - 30 sec hold  Seated Hamstring Stretch - 2 x daily - 7 x weekly - 1 sets - 3 reps - 30 sec hold  Seated Thoracic Extension and Rotation with Reach - 1 x daily - 7 x weekly - 3 sets - 10 reps  Shoulder Flexion Serratus Activation with Resistance - 1 x daily - 7 x weekly - 1 sets - 10 reps - 3-5 sec hold  Upper Cervical Extension SNAG with Strap - 2 x daily - 7 x weekly - 1 sets - 5 reps - 5-10 sec hold  Seated Shoulder W - 4-5 x daily - 7 x weekly - 1 sets - 5-10 reps - 5 sec hold  Bilateral Scapular Depression with Anchored Resistance - Straight Arm - 2 x daily - 7 x weekly -  1 sets - 10 reps - 5 sec hold  Seated Shoulder Setting - 2 x daily - 7 x weekly - 1 sets - 5-10 reps - 3-5 sec hold  Hooklying Hamstring Stretch with Strap - 2 x daily - 7 x weekly - 1 sets - 3 reps - 30 sec hold  Hip Adductors and Hamstring Stretch with Strap - 2 x daily - 7 x weekly - 1 sets - 3 reps - 30 sec hold  Supine ITB Stretch with Strap - 2 x daily - 7 x weekly - 1 sets - 3 reps - 30 sec hold  Standing Shoulder Extension with Resistance - 2 x daily - 7 x weekly - 1-2 sets - 10 reps - 3 sec hold  Supine Double Knee to Chest - 2 x daily - 7 x weekly - 1 sets - 3 reps - 10-15 sec hold  Supine Quad Set - 2 x daily - 7 x weekly - 1 sets - 10 reps - 3 sec hold  Straight Leg Raise with External Rotation - 2 x daily - 7 x weekly - 1 sets - 10 reps - 3-5 sec hold  Prone Quadriceps Stretch with Strap - 2 x daily - 7 x weekly - 1 sets - 3 reps - 30 sec hold  Step Up - 2 x daily - 7 x weekly - 1 sets - 10 reps - 2 sec hold  Lateral Step Up - 2 x daily - 7 x weekly - 2 sets - 10 reps - 2 sec hold  Single Leg Stance - 2 x daily - 7 x weekly - 2 sets - 5 reps - 20 sec hold  Shoulder Flexion Serratus Activation with Resistance - 1 x daily - 7 x weekly - 1 sets - 10 reps - 3-5 sec hold  Prone Scapular Retraction - 2 x daily - 7 x weekly - 1 sets - 5-10 reps - 3-5 sec hold  Wall Quarter Squat - 2 x daily - 7 x weekly -  1-2 sets - 10 reps - 5-10 sec hold  Standing Terminal Knee Extension with Resistance - 2 x daily - 7 x weekly - 2-3 sets - 10 reps - 3-5 sec hold  Sit to Stand - 2 x daily - 7 x weekly - 1 sets - 10 reps - 3-5 sec hold

## 2021-01-10 NOTE — Therapy (Signed)
[facility] [place], [place], [address] Phone: [phone]   Fax:  [phone]  Physical Therapy Treatment  Patient Details  Name: Lacey Barron MRN: [id number] Date of Birth: [birth year] Referring Provider (PT): Cline Crock, [place]   Encounter Date: 01/10/2021   PT End of Session - 01/10/21 0847    Visit Number 19    Number of Visits 24    Date for PT Re-Evaluation 01/26/21    Authorization - Visit Number 29    Authorization - Number of Visits 24    PT Start Time 0845    PT Stop Time 1010    PT Time Calculation (min) 85 min    Activity Tolerance Patient tolerated treatment well           Past Medical History:  Diagnosis Date  . Chronic knee pain    bilateral   . Complication of anesthesia    hard to wake  . GERD (gastroesophageal reflux disease)   . History of abnormal cervical Pap smear 2019  . Hypertension    followed by pcp  . IDA (iron deficiency anemia)   . Mild intermittent asthma    followed by pcp  . OA (osteoarthritis)    shoulders and knees  . PONV (postoperative nausea and vomiting)   . Sickle cell trait ([place])   . SUI (stress urinary incontinence, female)   . Uterine fibroid   . Wears glasses   . Wears partial dentures    upper and lower    Past Surgical History:  Procedure Laterality Date  . COLONOSCOPY  per pt scheduled 05-06-2020  . DILATATION & CURETTAGE/HYSTEROSCOPY WITH MYOSURE N/A 05/09/2020   Procedure: DILATATION & CURETTAGE/HYSTEROSCOPY WITH MYOSURE  RESECTION OF ENDOMETRIAL MASS;  Surgeon: Megan Salon, MD;  Location: [facility];  Service: Gynecology;  Laterality: N/A;  polyp resection  . TUBAL LIGATION Bilateral 1980s    There were no vitals filed for this visit.   Subjective Assessment - 01/10/21 0848    Subjective Very sore after last visit. Better by the next day.    Currently in Pain? No/denies    Pain Score 0-No pain    Pain  Location Neck    Pain Orientation Right;Left    Pain Descriptors / Indicators Tightness    Pain Type Acute pain    Pain Score 0    Pain Location Knee    Pain Orientation Left    Pain Descriptors / Indicators --   stiffness   Pain Type Chronic pain              [facility] PT Assessment - 01/10/21 0001      Assessment   Medical Diagnosis Cervicalgia; Lt knee pain    Referring Provider (PT) Cline Crock, PA-C    Onset Date/Surgical Date 09/29/20    Hand Dominance Right    Next MD Visit 02/05/21    Prior Therapy none      AROM   Left Knee Extension -4    Left Knee Flexion 105      Strength   Left Hip Flexion --   5-/5   Left Hip Extension --   5-/5   Left Hip ABduction 4+/5    Left Knee Flexion --   5-/5   Left Knee Extension --   5-/5     Flexibility   Hamstrings tight Rt 92 deg Lt 90 deg    Quadriceps Rt 112 deg Lt  90 deg in prone      Palpation   Palpation comment muscular tightness around Lt knee ant/post/medially; continued muscular tightness through Rt > Lt pecs; ant/lat/post cervical musculature; cervical and thoracic paraspinals; upper traps; periscapular musculature                         [facility] Adult PT Treatment/Exercise - 01/10/21 0001      Knee/Hip Exercises: Stretches   Passive Hamstring Stretch Left;1 rep;30 seconds    Quad Stretch Left;3 reps;30 seconds      Knee/Hip Exercises: Aerobic   Nustep L6 x 6 min UE's 10      Knee/Hip Exercises: Standing   Other Standing Knee Exercises TKE with blue TB 3 sec hold x 10 reps      Knee/Hip Exercises: Seated   Sit to Sand 10 reps;without UE support      Knee/Hip Exercises: Supine   Quad Sets Strengthening;Left;5 reps      Shoulder Exercises: Prone   Retraction Strengthening;5 reps    Retraction Limitations axial extension with scap squeeze 10 sec hold x 5 reps      Shoulder Exercises: Standing   Extension Strengthening;Both;Theraband;10 reps    Theraband Level (Shoulder Extension)  Level 4 (Blue)    Row Strengthening;Both;10 reps;Theraband    Theraband Level (Shoulder Row) Level 4 (Blue)    Row Limitations bow and arrow x 10 reps; row at 45 deg x 10 reps blue TB    Retraction Limitations scapular depression standing push down with blue TB behind back 10 reps x 2 sets    Shoulder Elevation Limitations activation of serratus red TB x 10 resp; shoulder clock with red TB x 10 reps varying angles    Other Standing Exercises thoracic rotation with shoulder rainbows x 6 reps each side.    Other Standing Exercises scap squeeze standing foam roll along spine      Shoulder Exercises: ROM/Strengthening   UBE (Upper Arm Bike) L5 x 4 min alternating forward and backward, standing    Other ROM/Strengthening Exercises shoulder flexion rolling dowel up doorframe x 5 reps      Shoulder Exercises: Stretch   Internal Rotation Stretch 5 reps   sliding dowel up to waist 10 hold   Internal Rotation Stretch Limitations dowel behind back moving into extension 10 sec x 5    Wall Stretch - Flexion Limitations shoulder flexion holding dowel in doorway for increased shoulder flexion 10 sec hold x 3 reps to pt tolerance    Other Shoulder Stretches lower and midlevel doorway stretch x 20 sec x 3 reps; stretch with hands on door frame 2 x 20 sec; moving to slightly higher position for doorway holding ~ 10 sec x 3 reps    Other Shoulder Stretches at wall shoulder clock 3 pm to noon x 10; T for stretch through the pecs 20-30 sec x 3 rep each side      Shoulder Exercises: Body Blade   Flexion 30 seconds;3 reps      Moist Heat Therapy   Number Minutes Moist Heat 15 Minutes    Moist Heat Location Cervical;Shoulder;Knee;Other (comment)   posterior knee HS/gastroc     Cryotherapy   Number Minutes Cryotherapy 15 Minutes    Cryotherapy Location Knee   medial Lt   Type of Cryotherapy Ice pack      Traction   Type of Traction Cervical   table higher to provide straighter pull   Min (lbs)  18    Max  (lbs) 24    Hold Time 60    Rest Time 10    Time 15      Manual Therapy   Manual therapy comments Skilled palpation and monitoring of soft tissues during DN    Joint Mobilization PA and lateral mobs lower cervical upper thoracic spine Grade II to III    Soft tissue mobilization Rt/Lt upper trap, scalenes; Rt teres/triceps pt prone    Myofascial Release posterior cervical    Other Manual Therapy using pillow case to assist with cervical extension pt in sitting assisting with the pillow case then PT assist with cervical extension using pillow case x 5-6 reps      Neck Exercises: Stretches   Upper Trapezius Stretch Right;Left;10 seconds;3 reps    Levator Stretch Right;Left;2 reps;10 seconds                  PT Education - 01/10/21 0959    Education Details HEP    Person(s) Educated Patient    Methods Explanation;Demonstration;Tactile cues;Verbal cues;Handout    Comprehension Verbalized understanding;Returned demonstration;Verbal cues required;Tactile cues required               PT Long Term Goals - 12/22/20 1134      PT LONG TERM GOAL #1   Title Improve posture and alignment with patient to demonstrate improved upright posture of head and neck with posterior shoulder girdle engaged    Time 6    Period Weeks    Status New    Target Date 02/02/21      PT LONG TERM GOAL #2   Title Increased cervical extension; lateral flexion; rotation by 10-15 degrees or more in all planes    Time 6    Period Weeks    Status Revised    Target Date 02/02/21      PT LONG TERM GOAL #3   Title Decrease pain with functional activities and sleeping by 50-75% allowing patient to live more normally    Time 6    Period Weeks    Status Revised    Target Date 02/02/21      PT LONG TERM GOAL #4   Title Independent in HEP including aquatic exercise as appropriate    Time 6    Period Weeks    Status Revised    Target Date 02/02/21      PT LONG TERM GOAL #5   Title Improve functional  limitatioin score to 51    Time 6    Period Weeks    Status Achieved      PT LONG TERM GOAL #6   Title Increase AROM Lt knee to 100 deg flexion and 0 deg extension    Time 6    Period Weeks    Status New    Target Date 02/02/21      PT LONG TERM GOAL #7   Title Increase strength Lt hip to 5/5    Time 6    Period Weeks    Status New    Target Date 02/02/21      PT LONG TERM GOAL #8   Title Normal gait including step over step for stairs    Time 6    Period Weeks    Status New    Target Date 02/02/21                 Plan - 01/10/21 0849    Clinical Impression Statement Patient reports  soreness in neck and knee following last visit but symptoms subsided by the following day. She has less radicular pain into Rt UE - now only to mid arm/deltoid area but radiating pain is less frequent. She can resolve pain in Rt UE with exercise and postural changes. She is working on the exercises at home. She demonstrates increasing mobility and strength. Patient demonstrates good gains in Lt ROM and Lt LE strength. Progressing well toward stated goals of therapy.    Rehab Potential Good    PT Frequency 2x / week    PT Duration 6 weeks    PT Treatment/Interventions ADLs/Self Care Home Management;Aquatic Therapy;Cryotherapy;Electrical Stimulation;Iontophoresis 4mg /ml Dexamethasone;Moist Heat;Ultrasound;Functional mobility training;Therapeutic activities;Therapeutic exercise;Neuromuscular re-education;Patient/family education;Manual techniques;Passive range of motion;Dry needling;Taping;Traction    PT Next Visit Plan continue treatment for Lt knee; progress with postural correction and ROM exercises; continue DN/manual work; ergonomic education;  continue DN in upper trap, pecs, scapular musculature and tape for shoulders. Continue mechanical cervical traction; manual work, mobilization and DN to address cervical tightnes.    PT Home Exercise Plan [email or web address]    Consulted and Agree with Plan of  Care Patient           Patient will benefit from skilled therapeutic intervention in order to improve the following deficits and impairments:     Visit Diagnosis: Abnormal posture  Other symptoms and signs involving the musculoskeletal system  Cervicalgia  Acute pain of left knee  Muscle weakness (generalized)  Other abnormalities of gait and mobility     Problem List Patient Active Problem List   Diagnosis Date Noted  . Chronic pain of both knees 02/25/2020  . Urinary incontinence 02/19/2019  . 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 Allergic rhinitis 11/10/2015  . Asthma 11/10/2015  . CLAUDICATION, INTERMITTENT 06/12/2010  . ANEMIA-IRON DEFICIENCY 07/20/2008  . Obesity 07/19/2008  . HYPERTENSION, BENIGN, MILD 07/19/2008    Livingston Denner Nilda Simmer PT, MPH  01/10/2021, 10:15 AM  [facility] [place], [place], [address] Phone: [phone]   Fax:  [phone]  Name: Lacey Barron MRN: [id number] Date of Birth: 05/25/1956

## 2021-01-12 ENCOUNTER — Other Ambulatory Visit: Payer: Self-pay

## 2021-01-12 ENCOUNTER — Ambulatory Visit (INDEPENDENT_AMBULATORY_CARE_PROVIDER_SITE_OTHER): Payer: PRIVATE HEALTH INSURANCE | Admitting: Physical Therapy

## 2021-01-12 DIAGNOSIS — R293 Abnormal posture: Secondary | ICD-10-CM

## 2021-01-12 DIAGNOSIS — M542 Cervicalgia: Secondary | ICD-10-CM | POA: Diagnosis not present

## 2021-01-12 DIAGNOSIS — R29898 Other symptoms and signs involving the musculoskeletal system: Secondary | ICD-10-CM | POA: Diagnosis not present

## 2021-01-12 DIAGNOSIS — M6281 Muscle weakness (generalized): Secondary | ICD-10-CM

## 2021-01-12 DIAGNOSIS — R2689 Other abnormalities of gait and mobility: Secondary | ICD-10-CM

## 2021-01-12 DIAGNOSIS — M25562 Pain in left knee: Secondary | ICD-10-CM | POA: Diagnosis not present

## 2021-01-12 NOTE — Therapy (Signed)
[place], [place], [address] Phone: [phone]   Fax:  [phone]  Physical Therapy Treatment  Patient Details  Name: Lacey Barron MRN: [id number] Date of Birth: 25-Nov-1955 Referring Provider (PT): Cline Crock, [place]   Encounter Date: 01/12/2021   PT End of Session - 01/12/21 0920    Visit Number 20    Number of Visits 24    Date for PT Re-Evaluation 01/26/21    Authorization - Visit Number 50    Authorization - Number of Visits 24    PT Start Time 0800    PT Stop Time 0930    PT Time Calculation (min) 90 min    Activity Tolerance Patient tolerated treatment well    Behavior During Therapy [facility] for tasks assessed/performed           Past Medical History:  Diagnosis Date  . Chronic knee pain    bilateral   . Complication of anesthesia    hard to wake  . GERD (gastroesophageal reflux disease)   . History of abnormal cervical Pap smear 2019  . Hypertension    followed by pcp  . IDA (iron deficiency anemia)   . Mild intermittent asthma    followed by pcp  . OA (osteoarthritis)    shoulders and knees  . PONV (postoperative nausea and vomiting)   . Sickle cell trait ([place])   . SUI (stress urinary incontinence, female)   . Uterine fibroid   . Wears glasses   . Wears partial dentures    upper and lower    Past Surgical History:  Procedure Laterality Date  . COLONOSCOPY  per pt scheduled 05-06-2020  . DILATATION & CURETTAGE/HYSTEROSCOPY WITH MYOSURE N/A 05/09/2020   Procedure: DILATATION & CURETTAGE/HYSTEROSCOPY WITH MYOSURE  RESECTION OF ENDOMETRIAL MASS;  Surgeon: Megan Salon, MD;  Location: [facility];  Service: Gynecology;  Laterality: N/A;  polyp resection  . TUBAL LIGATION Bilateral 1980s    There were no vitals filed for this visit.   Subjective Assessment - 01/12/21 0805    Subjective Pt states her knee feels "pretty good as long as I keep  it taped". Pt states she still has an "annoying" pain in Rt upper trap    Currently in Pain? No/denies              [facility] PT Assessment - 01/12/21 0001      Assessment   Medical Diagnosis Cervicalgia; Lt knee pain    Referring Provider (PT) Cline Crock, PA-C    Onset Date/Surgical Date 09/29/20    Hand Dominance Right    Next MD Visit 02/05/21    Prior Therapy none                         [facility] Adult PT Treatment/Exercise - 01/12/21 0001      Knee/Hip Exercises: Stretches   Passive Hamstring Stretch Left;2 reps;30 seconds    ITB Stretch Left;2 reps;30 seconds      Knee/Hip Exercises: Aerobic   Nustep L6 x 6 min LEs and UEs      Knee/Hip Exercises: Standing   Other Standing Knee Exercises TKE with blue TB 3 sec hold x 10 reps      Knee/Hip Exercises: Seated   Sit to Sand 10 reps;without UE support      Knee/Hip Exercises: Supine   Quad Sets Strengthening;Left;10 reps    Short [employer]  Sets Strengthening;Left;10 reps      Shoulder Exercises: Standing   Extension Strengthening;Both;Theraband;10 reps    Theraband Level (Shoulder Extension) Level 4 (Blue)    Row Strengthening;Both;10 reps;Theraband    Theraband Level (Shoulder Row) Level 4 (Blue)    Row Limitations bow and arrow x 10 blue TB    Retraction Limitations scapular depression standing push down with blue TB behind back 10 reps x 2 sets    Shoulder Elevation Limitations serratus clocks red TB x 10    Other Standing Exercises scap squeeze standing foam roll along spine      Shoulder Exercises: ROM/Strengthening   UBE (Upper Arm Bike) L5 x 4 min alt fwd/bkwd    Other ROM/Strengthening Exercises shoulder flexion rolling dowel up doorframe x 5 reps      Shoulder Exercises: Stretch   Internal Rotation Stretch Limitations dowel behind back moving into extension 10 sec x 5    Wall Stretch - Flexion Limitations shoulder flexion holding dowel in doorway for increased shoulder flexion 10 sec  hold x 3 reps to pt tolerance    Other Shoulder Stretches lower and midlevel doorway stretch x 20 sec x 3 reps; stretch with hands on door frame 2 x 20 sec; moving to slightly higher position for doorway holding ~ 10 sec x 3 reps      Shoulder Exercises: Body Blade   Flexion 30 seconds;3 reps      Moist Heat Therapy   Number Minutes Moist Heat 15 Minutes    Moist Heat Location Cervical;Shoulder;Knee      Cryotherapy   Number Minutes Cryotherapy 15 Minutes    Cryotherapy Location Knee    Type of Cryotherapy Ice pack      Traction   Type of Traction Cervical    Min (lbs) 18    Max (lbs) 24    Hold Time 60    Rest Time 10    Time 15      Manual Therapy   Joint Mobilization PA and lateral mobs lower cervical upper thoracic spine Grade II to III    Soft tissue mobilization bilat cervical paraspinals, upper trap, scalenes                       PT Long Term Goals - 12/22/20 1134      PT LONG TERM GOAL #1   Title Improve posture and alignment with patient to demonstrate improved upright posture of head and neck with posterior shoulder girdle engaged    Time 6    Period Weeks    Status New    Target Date 02/02/21      PT LONG TERM GOAL #2   Title Increased cervical extension; lateral flexion; rotation by 10-15 degrees or more in all planes    Time 6    Period Weeks    Status Revised    Target Date 02/02/21      PT LONG TERM GOAL #3   Title Decrease pain with functional activities and sleeping by 50-75% allowing patient to live more normally    Time 6    Period Weeks    Status Revised    Target Date 02/02/21      PT LONG TERM GOAL #4   Title Independent in HEP including aquatic exercise as appropriate    Time 6    Period Weeks    Status Revised    Target Date 02/02/21      PT LONG TERM GOAL #  5   Title Improve functional limitatioin score to 51    Time 6    Period Weeks    Status Achieved      PT LONG TERM GOAL #6   Title Increase AROM Lt knee to  100 deg flexion and 0 deg extension    Time 6    Period Weeks    Status New    Target Date 02/02/21      PT LONG TERM GOAL #7   Title Increase strength Lt hip to 5/5    Time 6    Period Weeks    Status New    Target Date 02/02/21      PT LONG TERM GOAL #8   Title Normal gait including step over step for stairs    Time 6    Period Weeks    Status New    Target Date 02/02/21                 Plan - 01/12/21 0920    Clinical Impression Statement Pt with decreased soreness in neck this session, good tolerance to postural exercises, fatigues quickly with serratus clocks. Pt continues with Lt quad weakness especially with TKE. Pt reports good response to mechanical traction. Pt states she is performing HEP    PT Next Visit Plan Lt quad strength and endurance, postural correction and shoulder/cervical ROM.  manual and modalities as indicated    PT Home Exercise Plan [email or web address]    Consulted and Agree with Plan of Care Patient           Patient will benefit from skilled therapeutic intervention in order to improve the following deficits and impairments:     Visit Diagnosis: Abnormal posture  Other symptoms and signs involving the musculoskeletal system  Cervicalgia  Acute pain of left knee  Muscle weakness (generalized)  Other abnormalities of gait and mobility     Problem List Patient Active Problem List   Diagnosis Date Noted  . Chronic pain of both knees 02/25/2020  . Urinary incontinence 02/19/2019  . 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 Allergic rhinitis 11/10/2015  . Asthma 11/10/2015  . CLAUDICATION, INTERMITTENT 06/12/2010  . ANEMIA-IRON DEFICIENCY 07/20/2008  . Obesity 07/19/2008  . HYPERTENSION, BENIGN, MILD 07/19/2008   Burdette Gergely, PT  Nzinga Ferran 01/12/2021, 9:22 AM  [facility] [place], [place], [address] Phone: [phone]   Fax:  [phone]  Name: Lacey Barron MRN: [id number] Date of Birth: Nov 20, 1955

## 2021-01-25 ENCOUNTER — Encounter: Payer: Self-pay | Admitting: Rehabilitative and Restorative Service Providers"

## 2021-01-25 ENCOUNTER — Other Ambulatory Visit: Payer: Self-pay

## 2021-01-25 ENCOUNTER — Ambulatory Visit (INDEPENDENT_AMBULATORY_CARE_PROVIDER_SITE_OTHER): Payer: PRIVATE HEALTH INSURANCE | Admitting: Rehabilitative and Restorative Service Providers"

## 2021-01-25 DIAGNOSIS — R29898 Other symptoms and signs involving the musculoskeletal system: Secondary | ICD-10-CM

## 2021-01-25 DIAGNOSIS — M25562 Pain in left knee: Secondary | ICD-10-CM

## 2021-01-25 DIAGNOSIS — R293 Abnormal posture: Secondary | ICD-10-CM | POA: Diagnosis not present

## 2021-01-25 DIAGNOSIS — M542 Cervicalgia: Secondary | ICD-10-CM

## 2021-01-25 DIAGNOSIS — M6281 Muscle weakness (generalized): Secondary | ICD-10-CM

## 2021-01-25 DIAGNOSIS — R2689 Other abnormalities of gait and mobility: Secondary | ICD-10-CM

## 2021-01-25 NOTE — Therapy (Signed)
[place], [place], [address] Phone: [phone]   Fax:  [phone]  Physical Therapy Treatment  Patient Details  Name: Lacey Barron MRN: [id number] Date of Birth: 1956-03-03 Referring Provider (PT): Cline Crock, [place]   Encounter Date: 01/25/2021   PT End of Session - 01/25/21 0840    Visit Number 21    Number of Visits 24    Date for PT Re-Evaluation 01/26/21    Authorization - Visit Number 45    Authorization - Number of Visits 24    PT Start Time 0840    PT Stop Time 0930    PT Time Calculation (min) 50 min    Activity Tolerance Patient tolerated treatment well           Past Medical History:  Diagnosis Date  . Chronic knee pain    bilateral   . Complication of anesthesia    hard to wake  . GERD (gastroesophageal reflux disease)   . History of abnormal cervical Pap smear 2019  . Hypertension    followed by pcp  . IDA (iron deficiency anemia)   . Mild intermittent asthma    followed by pcp  . OA (osteoarthritis)    shoulders and knees  . PONV (postoperative nausea and vomiting)   . Sickle cell trait ([place])   . SUI (stress urinary incontinence, female)   . Uterine fibroid   . Wears glasses   . Wears partial dentures    upper and lower    Past Surgical History:  Procedure Laterality Date  . COLONOSCOPY  per pt scheduled 05-06-2020  . DILATATION & CURETTAGE/HYSTEROSCOPY WITH MYOSURE N/A 05/09/2020   Procedure: DILATATION & CURETTAGE/HYSTEROSCOPY WITH MYOSURE  RESECTION OF ENDOMETRIAL MASS;  Surgeon: Megan Salon, MD;  Location: [facility];  Service: Gynecology;  Laterality: N/A;  polyp resection  . TUBAL LIGATION Bilateral 1980s    There were no vitals filed for this visit.   Subjective Assessment - 01/25/21 0841    Subjective On vacation last week. She had increased pain in the Rt neck/shoulder area with no known cause. With discussion patient  feels she may have had different postures and different activities. She did purge files at work before vacation and notices that that always irritates things. She states that she went without the tape on the knee for three days but had to start the tape this morning.    Currently in Pain? Yes    Pain Score 2     Pain Location Neck    Pain Orientation Right;Left    Pain Descriptors / Indicators Tightness    Pain Type Acute pain    Pain Radiating Towards top of Rt > Lt shoulder    Pain Score 0    Pain Location Knee              [facility] PT Assessment - 01/25/21 0001      Assessment   Medical Diagnosis Cervicalgia; Lt knee pain    Referring Provider (PT) Cline Crock, PA-C    Onset Date/Surgical Date 09/29/20    Hand Dominance Right    Next MD Visit 01/31/21    Prior Therapy none      AROM   Cervical Flexion 34    Cervical Extension 13    Cervical - Right Side Bend 20    Cervical - Left Side Bend 20    Cervical - Right Rotation 48  Cervical - Left Rotation 45      Palpation   Spinal mobility hypomobile thoracic and cervical    Palpation comment muscular tightness around Lt knee ant/post/medially; continued muscular tightness through Rt > Lt pecs; ant/lat/post cervical musculature; cervical and thoracic paraspinals; upper traps; periscapular musculature                         [facility] Adult PT Treatment/Exercise - 01/25/21 0001      Neck Exercises: Seated   Neck Retraction 5 reps;5 secs    Cervical Rotation Right;Left;5 reps    Lateral Flexion Right;Left;5 reps      Knee/Hip Exercises: Aerobic   Nustep L5 x 5 min LEs and UEs      Shoulder Exercises: Standing   Shoulder Elevation Limitations serratus clocks red TB x 10    Other Standing Exercises scap squeeze standing foam roll along spine      Shoulder Exercises: ROM/Strengthening   UBE (Upper Arm Bike) L5 x 4 min alt fwd/bkwd      Shoulder Exercises: Stretch   Wall Stretch - Flexion Limitations  shoulder flexion holding dowel in doorway for increased shoulder flexion 10 sec hold x 3 reps to pt tolerance    Other Shoulder Stretches lower and midlevel doorway stretch x 20 sec x 3 reps; stretch with hands on door frame 2 x 20 sec; moving to slightly higher position for doorway holding ~ 10 sec x 3 reps      Moist Heat Therapy   Number Minutes Moist Heat 15 Minutes    Moist Heat Location Cervical;Shoulder;Knee      Cryotherapy   Number Minutes Cryotherapy 15 Minutes    Cryotherapy Location Knee    Type of Cryotherapy Ice pack      Traction   Type of Traction Cervical    Min (lbs) 18    Max (lbs) 24    Hold Time 60    Rest Time 10    Time 15      Manual Therapy   Manual therapy comments Skilled palpation and monitoring of soft tissues during DN    Joint Mobilization PA and lateral mobs lower cervical upper thoracic spine Grade II to III    Soft tissue mobilization bilat cervical paraspinals, upper trap, scalenes    Myofascial Release posterior cervical to upper traps            Trigger Point Dry Needling - 01/25/21 0001    Consent Given? Yes    Education Handout Provided Previously provided    Other Dry Needling Bilat    Upper Trapezius Response Palpable increased muscle length;Twitch reponse elicited    Levator Scapulae Response Palpable increased muscle length    Cervical multifidi Response Palpable increased muscle length                     PT Long Term Goals - 12/22/20 1134      PT LONG TERM GOAL #1   Title Improve posture and alignment with patient to demonstrate improved upright posture of head and neck with posterior shoulder girdle engaged    Time 6    Period Weeks    Status New    Target Date 02/02/21      PT LONG TERM GOAL #2   Title Increased cervical extension; lateral flexion; rotation by 10-15 degrees or more in all planes    Time 6    Period Weeks    Status  Revised    Target Date 02/02/21      PT LONG TERM GOAL #3   Title  Decrease pain with functional activities and sleeping by 50-75% allowing patient to live more normally    Time 6    Period Weeks    Status Revised    Target Date 02/02/21      PT LONG TERM GOAL #4   Title Independent in HEP including aquatic exercise as appropriate    Time 6    Period Weeks    Status Revised    Target Date 02/02/21      PT LONG TERM GOAL #5   Title Improve functional limitatioin score to 51    Time 6    Period Weeks    Status Achieved      PT LONG TERM GOAL #6   Title Increase AROM Lt knee to 100 deg flexion and 0 deg extension    Time 6    Period Weeks    Status New    Target Date 02/02/21      PT LONG TERM GOAL #7   Title Increase strength Lt hip to 5/5    Time 6    Period Weeks    Status New    Target Date 02/02/21      PT LONG TERM GOAL #8   Title Normal gait including step over step for stairs    Time 6    Period Weeks    Status New    Target Date 02/02/21                 Plan - 01/25/21 0932    Clinical Impression Statement Flare up of cervical symptoms likely related to increased filing at work followed by change of postures and activities while on vacation. Minimal changes in cervical ROM. Note increased muscular tightness through the cervical and upper trap musculature. Patient will benefit from continue treatment to address cervical dysfunction and Lt knee pain/weakness/decreased functional tolerance.    Rehab Potential Good    PT Frequency 2x / week    PT Duration 6 weeks    PT Treatment/Interventions ADLs/Self Care Home Management;Aquatic Therapy;Cryotherapy;Electrical Stimulation;Iontophoresis 4mg /ml Dexamethasone;Moist Heat;Ultrasound;Functional mobility training;Therapeutic activities;Therapeutic exercise;Neuromuscular re-education;Patient/family education;Manual techniques;Passive range of motion;Dry needling;Taping;Traction    PT Next Visit Plan Lt quad strength and endurance, postural correction and shoulder/cervical ROM.   manual and modalities as indicated - re-cert at next visit    PT [place] and Agree with Plan of Care Patient           Patient will benefit from skilled therapeutic intervention in order to improve the following deficits and impairments:     Visit Diagnosis: Abnormal posture  Other symptoms and signs involving the musculoskeletal system  Cervicalgia  Acute pain of left knee  Muscle weakness (generalized)  Other abnormalities of gait and mobility     Problem List Patient Active Problem List   Diagnosis Date Noted  . Chronic pain of both knees 02/25/2020  . Urinary incontinence 02/19/2019  . 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 Allergic rhinitis 11/10/2015  . Asthma 11/10/2015  . CLAUDICATION, INTERMITTENT 06/12/2010  . ANEMIA-IRON DEFICIENCY 07/20/2008  . Obesity 07/19/2008  . HYPERTENSION, BENIGN, MILD 07/19/2008    Lucien Budney Nilda Simmer PT, MPH  01/25/2021, 9:25 AM  [facility] [place], [place], [address] Phone: [phone]   Fax:  [phone]  Name: Lacey Barron MRN:  [id number] Date of Birth: [birth year]

## 2021-01-26 NOTE — Addendum Note (Signed)
Addended by: Everardo All on: 01/26/2021 03:08 PM   Modules accepted: Orders

## 2021-01-27 ENCOUNTER — Other Ambulatory Visit: Payer: Self-pay

## 2021-01-27 ENCOUNTER — Ambulatory Visit (INDEPENDENT_AMBULATORY_CARE_PROVIDER_SITE_OTHER): Payer: PRIVATE HEALTH INSURANCE | Admitting: Physical Therapy

## 2021-01-27 ENCOUNTER — Encounter: Payer: Self-pay | Admitting: Physical Therapy

## 2021-01-27 DIAGNOSIS — M542 Cervicalgia: Secondary | ICD-10-CM | POA: Diagnosis not present

## 2021-01-27 DIAGNOSIS — M6281 Muscle weakness (generalized): Secondary | ICD-10-CM

## 2021-01-27 DIAGNOSIS — R293 Abnormal posture: Secondary | ICD-10-CM

## 2021-01-27 DIAGNOSIS — R29898 Other symptoms and signs involving the musculoskeletal system: Secondary | ICD-10-CM | POA: Diagnosis not present

## 2021-01-27 DIAGNOSIS — M25562 Pain in left knee: Secondary | ICD-10-CM | POA: Diagnosis not present

## 2021-01-27 NOTE — Therapy (Signed)
[place], [place], [address] Phone: [phone]   Fax:  [phone]  Physical Therapy Treatment  Patient Details  Name: Lacey Barron MRN: [id number] Date of Birth: 1956/08/06 Referring Provider (PT): Cline Crock, [place]   Encounter Date: 01/27/2021   PT End of Session - 01/27/21 0849     Visit Number 22    Number of Visits 34    Date for PT Re-Evaluation 03/10/21    Authorization - Visit Number 37    Authorization - Number of Visits 24    PT Start Time 6222    PT Stop Time 0945   MHP/ice last 10 min   PT Time Calculation (min) 58 min    Activity Tolerance Patient tolerated treatment well    Behavior During Therapy [facility] for tasks assessed/performed             Past Medical History:  Diagnosis Date   Chronic knee pain    bilateral    Complication of anesthesia    hard to wake   GERD (gastroesophageal reflux disease)    History of abnormal cervical Pap smear 2019   Hypertension    followed by pcp   IDA (iron deficiency anemia)    Mild intermittent asthma    followed by pcp   OA (osteoarthritis)    shoulders and knees   PONV (postoperative nausea and vomiting)    Sickle cell trait ([facility])    SUI (stress urinary incontinence, female)    Uterine fibroid    Wears glasses    Wears partial dentures    upper and lower    Past Surgical History:  Procedure Laterality Date   COLONOSCOPY  per pt scheduled 05-06-2020   DILATATION & CURETTAGE/HYSTEROSCOPY WITH MYOSURE N/A 05/09/2020   Procedure: DILATATION & CURETTAGE/HYSTEROSCOPY WITH MYOSURE  RESECTION OF ENDOMETRIAL MASS;  Surgeon: Megan Salon, MD;  Location: [place];  Service: Gynecology;  Laterality: N/A;  polyp resection   TUBAL LIGATION Bilateral 1980s    There were no vitals filed for this visit.   Subjective Assessment - 01/27/21 0853     Subjective Pt reports that things are slowly improving.   She complains of tingling and aggrivation in Rt upper trap area.  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 She still needs to tape her knee to decrease pain.    Currently in Pain? Yes    Pain Score 3     Pain Location Neck    Pain Orientation Right;Lower    Pain Descriptors / Indicators Nagging    Aggravating Factors  certain activities    Pain Relieving Factors TENS, heat, massage                [facility] PT Assessment - 01/27/21 0001       Assessment   Medical Diagnosis Cervicalgia; Lt knee pain    Referring Provider (PT) Cline Crock, PA-C    Onset Date/Surgical Date 09/29/20    Hand Dominance Right    Next MD Visit 01/31/21    Prior Therapy none      AROM   Cervical Extension 30               [facility] Adult PT Treatment/Exercise - 01/27/21 0001       Knee/Hip Exercises: Stretches   Passive Hamstring Stretch Left;2 reps;Right;1 rep;20 seconds   seated and standing with  hip hinge   Quad Stretch Left;2 reps;Right;1 rep;20 seconds   seated with foot under chair, sliding buttocks forward.   Other Knee/Hip Stretches standing hip adductor (LLE) stretch with forearms leaning on counter x 20 sec      Knee/Hip Exercises: Aerobic   Nustep L5 x 6.5 min LEs and UEs    Other Aerobic walking single laps around gym to assess response to treatment.      Knee/Hip Exercises: Standing   Stairs reciprocal pattern up/down 25 steps (4 + 6") with/without UE on rails - no pain or difficulty.      Shoulder Exercises: ROM/Strengthening   UBE (Upper Arm Bike) L5 x 4 min alt fwd/bkwd      Shoulder Exercises: Stretch   Other Shoulder Stretches high and low doorway stretch x 15 sec x 2 reps each .      Moist Heat Therapy   Number Minutes Moist Heat 10 Minutes    Moist Heat Location Cervical;Shoulder;Knee   bilat neck, Lt post knee     Cryotherapy   Number Minutes Cryotherapy 10 Minutes    Cryotherapy Location Knee    Lt ant knee   Type of Cryotherapy Ice pack      Traction   Type of Traction --   held per pt request     Manual Therapy   Soft tissue mobilization bilat cervical paraspinals, upper trap, levator, Rt rhomboid.    Myofascial Release upper thoracic musculature      Neck Exercises: Stretches   Upper Trapezius Stretch Right;Left;10 seconds;3 reps   holding base of chair   Levator Stretch Right;Left;2 reps;10 seconds    Other Neck Stretches SNAG with strap for ext (high and low strap placement), and cervical rotation - mulitple reps each.              Trigger Point Dry Needling - 01/27/21 0001     Consent Given? Yes   DN provided by Isabelle Course, PT   Education Handout Provided Previously provided    Other Dry Needling Rt    Upper Trapezius Response Palpable increased muscle length;Twitch reponse elicited    Levator Scapulae Response Palpable increased muscle length              PT Long Term Goals - 01/27/21 1007       PT LONG TERM GOAL #1   Title Improve posture and alignment with patient to demonstrate improved upright posture of head and neck with posterior shoulder girdle engaged    Time 6    Period Weeks    Status Partially Met      PT LONG TERM GOAL #2   Title Increased cervical extension; lateral flexion; rotation by 10-15 degrees or more in all planes    Time 6    Period Weeks    Status Partially Met      PT LONG TERM GOAL #3   Title Decrease pain with functional activities and sleeping by 50-75% allowing patient to live more normally    Time 6    Period Weeks    Status Achieved      PT LONG TERM GOAL #4   Title Independent in HEP including aquatic exercise as appropriate    Time 6    Period Weeks    Status Partially Met      PT LONG TERM GOAL #5   Title Improve functional limitation score to 51    Time 6    Period Weeks    Status  Achieved      PT LONG TERM GOAL #6   Title Increase AROM Lt knee to 100 deg flexion and 0 deg extension    Time 6     Period Weeks    Status Partially Met      PT LONG TERM GOAL #7   Title Increase strength Lt hip to 5/5    Time 6    Period Weeks    Status On-going      PT LONG TERM GOAL #8   Title Normal gait including step over step for stairs    Baseline Able to complete without difficulty - 01/27/21    Time 6    Status Partially Met                   Plan - 01/27/21 0958     Clinical Impression Statement Tightness persists in Rt neck musculature; improved with DN/ manual therapy to area.  Pt demonstrated improved cervical ext (after DN) during cervical SNAG exercise.  Pt reported reduction of tightness / pain during stairs after stretches to LE. Encouraged pt to include LE stretches into daily routine to address ongoing knee pain during functional activities. Pt held traction as she wanted to see how she would do without it. She voiced interest in purchasing in-home unit if needed.  Pt has partially met her goals.    Rehab Potential Good    PT Frequency 2x / week    PT Duration 6 weeks    PT Treatment/Interventions ADLs/Self Care Home Management;Aquatic Therapy;Cryotherapy;Electrical Stimulation;Iontophoresis 39m/ml Dexamethasone;Moist Heat;Ultrasound;Functional mobility training;Therapeutic activities;Therapeutic exercise;Neuromuscular re-education;Patient/family education;Manual techniques;Passive range of motion;Dry needling;Taping;Traction    PT Next Visit Plan Lt quad strength and endurance, postural correction and shoulder/cervical ROM.  manual and modalities as indicated -    PT Home Exercise Plan [email or web address]   Consulted and Agree with Plan of Care Patient             Patient will benefit from skilled therapeutic intervention in order to improve the following deficits and impairments:  Abnormal gait, Decreased range of motion, Increased fascial restricitons, Impaired UE functional use, Decreased activity tolerance, Pain, Hypomobility, Impaired flexibility, Improper body  mechanics, Decreased mobility, Postural dysfunction, Decreased strength  Visit Diagnosis: Abnormal posture  Other symptoms and signs involving the musculoskeletal system  Cervicalgia  Acute pain of left knee  Muscle weakness (generalized)     Problem List Patient Active Problem List   Diagnosis Date Noted   Chronic pain of both knees 02/25/2020   Urinary incontinence 02/19/2019   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

## 2021-01-30 ENCOUNTER — Other Ambulatory Visit: Payer: Self-pay

## 2021-01-30 ENCOUNTER — Ambulatory Visit (INDEPENDENT_AMBULATORY_CARE_PROVIDER_SITE_OTHER): Payer: PRIVATE HEALTH INSURANCE | Admitting: Rehabilitative and Restorative Service Providers"

## 2021-01-30 DIAGNOSIS — M542 Cervicalgia: Secondary | ICD-10-CM | POA: Diagnosis not present

## 2021-01-30 DIAGNOSIS — R29898 Other symptoms and signs involving the musculoskeletal system: Secondary | ICD-10-CM

## 2021-01-30 DIAGNOSIS — R293 Abnormal posture: Secondary | ICD-10-CM | POA: Diagnosis not present

## 2021-01-30 DIAGNOSIS — M25562 Pain in left knee: Secondary | ICD-10-CM | POA: Diagnosis not present

## 2021-01-30 DIAGNOSIS — M6281 Muscle weakness (generalized): Secondary | ICD-10-CM

## 2021-01-30 NOTE — Patient Instructions (Signed)
Access Code: [email or web address] URL: [email or web address] Date: 01/30/2021 Prepared by: Rudell Cobb  Exercises Upper Cervical Extension SNAG with Strap - 2 x daily - 7 x weekly - 1 sets - 5 reps - 5-10 sec hold Seated Cervical Sidebending AROM - 2 x daily - 7 x weekly - 1 sets - 5 reps - 5-10 sec hold Seated Cervical Rotation AROM - 2 x daily - 7 x weekly - 1 sets - 5 reps - 2-3 sec hold Seated Shoulder W - 4-5 x daily - 7 x weekly - 1 sets - 5-10 reps - 5 sec hold Standing Backward Shoulder Rolls - 2 x daily - 7 x weekly - 1 sets - 10 reps - 1-2 sec hold Doorway Pec Stretch at 60 Degrees Abduction - 3 x daily - 7 x weekly - 3 reps - 1 sets Doorway Pec Stretch at 120 Degrees Abduction - 3 x daily - 7 x weekly - 3 reps - 1 sets - 30 second hold hold Bilateral Scapular Depression with Anchored Resistance - Straight Arm - 2 x daily - 7 x weekly - 1 sets - 10 reps - 5 sec hold Standing Shoulder Horizontal Abduction with Resistance - 2 x daily - 7 x weekly - 1 sets - 10 reps Standing Bilateral Low Shoulder Row with Anchored Resistance - 2 x daily - 7 x weekly - 1-3 sets - 10 reps - 2-3 sec hold Seated Thoracic Extension and Rotation with Reach - 1 x daily - 7 x weekly - 3 sets - 10 reps Sit to Stand - 2 x daily - 7 x weekly - 1 sets - 10 reps Seated Hamstring Stretch - 2 x daily - 7 x weekly - 1 sets - 3 reps - 30 sec hold Single Leg Stance - 2 x daily - 7 x weekly - 2 sets - 5 reps - 20 sec hold Gastroc Stretch on Wall - 2 x daily - 7 x weekly - 1 sets - 3 reps - 30 sec hold Step Up - 2 x daily - 7 x weekly - 1 sets - 10 reps - 2 sec hold Lateral Step Up - 2 x daily - 7 x weekly - 2 sets - 10 reps - 2 sec hold

## 2021-01-30 NOTE — Therapy (Signed)
[place], [place], [address] Phone: [phone]   Fax:  [phone]  Physical Therapy Treatment  Patient Details  Name: Lacey Barron MRN: [id number] Date of Birth: 05-04-1956 Referring Provider (PT): Cline Crock, [place]   Encounter Date: 01/30/2021   PT End of Session - 01/30/21 0919     Visit Number 23    Number of Visits 34    Date for PT Re-Evaluation 03/10/21    Authorization - Visit Number 23    Authorization - Number of Visits 24    PT Start Time 0802    PT Stop Time 0929    PT Time Calculation (min) 87 min    Activity Tolerance Patient tolerated treatment well    Behavior During Therapy [facility] for tasks assessed/performed             Past Medical History:  Diagnosis Date   Chronic knee pain    bilateral    Complication of anesthesia    hard to wake   GERD (gastroesophageal reflux disease)    History of abnormal cervical Pap smear 2019   Hypertension    followed by pcp   IDA (iron deficiency anemia)    Mild intermittent asthma    followed by pcp   OA (osteoarthritis)    shoulders and knees   PONV (postoperative nausea and vomiting)    Sickle cell trait (HCC)    SUI (stress urinary incontinence, female)    Uterine fibroid    Wears glasses    Wears partial dentures    upper and lower    Past Surgical History:  Procedure Laterality Date   COLONOSCOPY  per pt scheduled 05-06-2020   DILATATION & CURETTAGE/HYSTEROSCOPY WITH MYOSURE N/A 05/09/2020   Procedure: DILATATION & CURETTAGE/HYSTEROSCOPY WITH MYOSURE  RESECTION OF ENDOMETRIAL MASS;  Surgeon: Megan Salon, MD;  Location: [facility];  Service: Gynecology;  Laterality: N/A;  polyp resection   TUBAL LIGATION Bilateral 1980s    There were no vitals filed for this visit.   Subjective Assessment - 01/30/21 0802     Subjective The patient reports she continues with neck tightness R side, but  it can be bilateral.  She gets pain into scapular region.  She is using tape on her knee    Pertinent History hx of Lt knee pain                [facility] PT Assessment - 01/30/21 0805       Assessment   Medical Diagnosis Cervicalgia; Lt knee pain    Referring Provider (PT) Cline Crock, PA-C    Onset Date/Surgical Date 09/29/20    Hand Dominance Right                           [facility] Adult PT Treatment/Exercise - 01/30/21 0805       Exercises   Exercises Shoulder;Knee/Hip;Other Exercises    Other Exercises  UBE x 3.5 minutes while standing on foam.  Performed 2.25 minutes forward, 1.25 minutes backwards      Neck Exercises: Seated   Neck Retraction 5 reps;5 secs    Cervical Rotation Right;Left;5 reps    Lateral Flexion Right;Left;5 reps      Knee/Hip Exercises: Stretches   Active Hamstring Stretch Right;Left;2 reps;20 seconds    Other Knee/Hip Stretches slant boatrd x 3 reps x 30 seconds      Knee/Hip Exercises:  Standing   Heel Raises Both;10 reps    Heel Raises Limitations feels pull in the left knee    Lateral Step Up Left;10 reps;Right   "feels weak"   Wall Squat 10 reps    Stairs with reciprocal pattern x 3 reps over 5 steps without pain    SLS on compliant surfaces    Gait Training Gait working on dec'ing antalgic pattern; used gait in between exercises to stretch out and reduce discomfort      Shoulder Exercises: Seated   Other Seated Exercises thoracic rotation seated      Shoulder Exercises: Standing   Horizontal ABduction Strengthening;Both;10 reps    Theraband Level (Shoulder Horizontal ABduction) Level 3 (Green)    External Rotation Strengthening;Both;10 reps    Theraband Level (Shoulder External Rotation) Level 3 (Green)    Extension Strengthening;Both;10 reps    Theraband Level (Shoulder Extension) Level 3 (Green)    Extension Limitations band pull down    Paramedic Level (Shoulder Row) Level 3 (Green)     Row Limitations standing low row    Retraction Strengthening;Both;10 reps    Retraction Limitations standing retraction, and W x 10 reps each    Other Standing Exercises scapular depression with bands      Shoulder Exercises: Stretch   Other Shoulder Stretches high and low doorway stretch x 15 sec x 2 reps each .      Traction   Type of Traction Cervical    Min (lbs) 18    Max (lbs) 24    Hold Time 60    Rest Time 10    Time 15      Manual Therapy   Manual Therapy Joint mobilization;Soft tissue mobilization;Myofascial release    Manual therapy comments Skilled palpation and monitoring of soft tissues during DN    Joint Mobilization CPA CTJ, lateral glides, CPA Cpine and Tspine grade II-III    Soft tissue mobilization bilat c-spine paraspinals, thoracic musculature/ rhomboids    Myofascial Release upper thoracic musculature              Trigger Point Dry Needling - 01/30/21 0937     Consent Given? Yes    Education Handout Provided Previously provided    Other Dry Needling right    Upper Trapezius Response Twitch reponse elicited;Palpable increased muscle length    Levator Scapulae Response Twitch response elicited;Palpable increased muscle length                  PT Education - 01/30/21 0918     Education Details PT and patient discussed/verbally reviewed exercise program and agreed to consolidate based on what she is doing at home.  Prior HEP had a lot of duplicates totaling >44 exercises.  We consolidated to 17 due to focusing on neck, shoulder, and knee.  Pt uses some ther ex to stretch in AM, some when at work.    Person(s) Educated Patient    Methods Explanation;Demonstration;Handout    Comprehension Verbalized understanding;Returned demonstration                 PT Long Term Goals - 01/27/21 1007       PT LONG TERM GOAL #1   Title Improve posture and alignment with patient to demonstrate improved upright posture of head and neck with posterior  shoulder girdle engaged    Time 6    Period Weeks    Status Partially Met      PT LONG  TERM GOAL #2   Title Increased cervical extension; lateral flexion; rotation by 10-15 degrees or more in all planes    Time 6    Period Weeks    Status Partially Met      PT LONG TERM GOAL #3   Title Decrease pain with functional activities and sleeping by 50-75% allowing patient to live more normally    Time 6    Period Weeks    Status Achieved      PT LONG TERM GOAL #4   Title Independent in HEP including aquatic exercise as appropriate    Time 6    Period Weeks    Status Partially Met      PT LONG TERM GOAL #5   Title Improve functional limitation score to 51    Time 6    Period Weeks    Status Achieved      PT LONG TERM GOAL #6   Title Increase AROM Lt knee to 100 deg flexion and 0 deg extension    Time 6    Period Weeks    Status Partially Met      PT LONG TERM GOAL #7   Title Increase strength Lt hip to 5/5    Time 6    Period Weeks    Status On-going      PT LONG TERM GOAL #8   Title Normal gait including step over step for stairs    Baseline Able to complete without difficulty - 01/27/21    Time 6    Status Partially Met                   Plan - 01/30/21 9311     Clinical Impression Statement The patient is managing her knee tape on her own.  She noted increased tightness in R c-spine and parascapular musculature without traction.  PT focused on continued L LE strengthening, postural strengthening, mobilization for hypomobile c-spine/t-spine, and consolidating HEP for greater long term compliance with program.    PT Treatment/Interventions ADLs/Self Care Home Management;Aquatic Therapy;Cryotherapy;Electrical Stimulation;Iontophoresis 67m/ml Dexamethasone;Moist Heat;Ultrasound;Functional mobility training;Therapeutic activities;Therapeutic exercise;Neuromuscular re-education;Patient/family education;Manual techniques;Passive range of motion;Dry  needling;Taping;Traction    PT Next Visit Plan *Need to update worker's comp case manager to request more visits; L LE strenghtening and muscular endurance, shoulder/cervical strengthening and modalities as needed    PT Home Exercise Plan [email or web address]   Consulted and Agree with Plan of Care Patient             Patient will benefit from skilled therapeutic intervention in order to improve the following deficits and impairments:     Visit Diagnosis: Abnormal posture  Other symptoms and signs involving the musculoskeletal system  Cervicalgia  Acute pain of left knee  Muscle weakness (generalized)     Problem List Patient Active Problem List   Diagnosis Date Noted   Chronic pain of both knees 02/25/2020   Urinary incontinence 02/19/2019   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

## 2021-01-31 ENCOUNTER — Other Ambulatory Visit (HOSPITAL_COMMUNITY): Payer: Self-pay | Admitting: Orthopedic Surgery

## 2021-01-31 ENCOUNTER — Other Ambulatory Visit: Payer: Self-pay | Admitting: Orthopedic Surgery

## 2021-01-31 ENCOUNTER — Other Ambulatory Visit (HOSPITAL_COMMUNITY): Payer: Self-pay

## 2021-01-31 DIAGNOSIS — M542 Cervicalgia: Secondary | ICD-10-CM

## 2021-01-31 MED ORDER — BACLOFEN 10 MG PO TABS
10.0000 mg | ORAL_TABLET | Freq: Four times a day (QID) | ORAL | 0 refills | Status: DC
  Filled 2021-01-31: qty 60, 15d supply, fill #0

## 2021-01-31 MED ORDER — LORAZEPAM 0.5 MG PO TABS
0.5000 mg | ORAL_TABLET | Freq: Every day | ORAL | 0 refills | Status: AC | PRN
  Filled 2021-01-31 – 2021-02-01 (×2): qty 1, 1d supply, fill #0

## 2021-02-01 ENCOUNTER — Other Ambulatory Visit (HOSPITAL_COMMUNITY): Payer: Self-pay

## 2021-02-06 ENCOUNTER — Other Ambulatory Visit: Payer: Self-pay

## 2021-02-06 ENCOUNTER — Ambulatory Visit (HOSPITAL_COMMUNITY)
Admission: RE | Admit: 2021-02-06 | Discharge: 2021-02-06 | Disposition: A | Discharge status: Home / Self Care | Payer: PRIVATE HEALTH INSURANCE | Source: Ambulatory Visit | Attending: Orthopedic Surgery | Admitting: Orthopedic Surgery

## 2021-02-06 DIAGNOSIS — M542 Cervicalgia: Secondary | ICD-10-CM | POA: Diagnosis present

## 2021-02-08 ENCOUNTER — Ambulatory Visit (INDEPENDENT_AMBULATORY_CARE_PROVIDER_SITE_OTHER): Payer: PRIVATE HEALTH INSURANCE | Admitting: Rehabilitative and Restorative Service Providers"

## 2021-02-08 ENCOUNTER — Other Ambulatory Visit: Payer: Self-pay

## 2021-02-08 DIAGNOSIS — M542 Cervicalgia: Secondary | ICD-10-CM

## 2021-02-08 DIAGNOSIS — M6281 Muscle weakness (generalized): Secondary | ICD-10-CM | POA: Diagnosis not present

## 2021-02-08 DIAGNOSIS — R29898 Other symptoms and signs involving the musculoskeletal system: Secondary | ICD-10-CM | POA: Diagnosis not present

## 2021-02-08 DIAGNOSIS — R293 Abnormal posture: Secondary | ICD-10-CM

## 2021-02-08 NOTE — Therapy (Signed)
[place], [place], [address] Phone: [phone]   Fax:  [phone]  Physical Therapy Treatment  Patient Details  Name: Lacey Barron MRN: [id number] Date of Birth: 10-27-1955 Referring Provider (PT): Cline Crock, [place]   Encounter Date: 02/08/2021   PT End of Session - 02/08/21 0818     Visit Number 24    Number of Visits 34    Date for PT Re-Evaluation 03/10/21    Authorization Type Approved 11Therapy sessions (02/08/2021-03/14/2021)  *The patient in notes has been approved initially for eval + 16 + 8 additional and then another 11 viisits.    Authorization - Visit Number 1    Authorization - Number of Visits 11    PT Start Time 0802    PT Stop Time [phone]    PT Time Calculation (min) 41 min    Activity Tolerance Patient tolerated treatment well    Behavior During Therapy [facility] for tasks assessed/performed             Past Medical History:  Diagnosis Date   Chronic knee pain    bilateral    Complication of anesthesia    hard to wake   GERD (gastroesophageal reflux disease)    History of abnormal cervical Pap smear 2019   Hypertension    followed by pcp   IDA (iron deficiency anemia)    Mild intermittent asthma    followed by pcp   OA (osteoarthritis)    shoulders and knees   PONV (postoperative nausea and vomiting)    Sickle cell trait (HCC)    SUI (stress urinary incontinence, female)    Uterine fibroid    Wears glasses    Wears partial dentures    upper and lower    Past Surgical History:  Procedure Laterality Date   COLONOSCOPY  per pt scheduled 05-06-2020   DILATATION & CURETTAGE/HYSTEROSCOPY WITH MYOSURE N/A 05/09/2020   Procedure: DILATATION & CURETTAGE/HYSTEROSCOPY WITH MYOSURE  RESECTION OF ENDOMETRIAL MASS;  Surgeon: Megan Salon, MD;  Location: [facility];  Service: Gynecology;  Laterality: N/A;  polyp resection   TUBAL LIGATION  Bilateral 1980s    There were no vitals filed for this visit.   Subjective Assessment - 02/08/21 0803     Subjective The patient reports she feels good this morning, but by lunch time time (2ish) she starts feeling pain in knee and neck.  Tape helps the knee with stability during moving at work.  The neck gets tightness and is aggravating.  Patient saw Dr. Veverly Fells last Tuesday.  He ordered home cervical traction unit.    Pertinent History hx of Lt knee pain    Patient Stated Goals "Being able to do what I need to do to relieve myself."  "I'm about getting there."    Currently in Pain? No/denies                [facility] PT Assessment - 02/08/21 0826       Assessment   Medical Diagnosis Cervicalgia; Lt knee pain    Referring Provider (PT) Cline Crock, PA-C    Onset Date/Surgical Date 09/29/20    Hand Dominance Right      AROM   Cervical Flexion 32    Cervical Extension 30   after muscle re-ed to use available ROM, was 12 degrees before manua techniques   Cervical - Right Side Bend 26    Cervical - Left Side Bend  20    Cervical - Right Rotation 45   gets up to 55 degrees of rotation after passive overpressure/mobilization with movement   Cervical - Left Rotation 44   gets up to 55 degrees of rotation after passive overpressure/mobilization with movement     Strength   Right Hip Flexion 5/5    Left Hip Flexion 4+/5   can hold against gravity, but does not have same quality of movement-- gets some shakiness   Left Hip Extension 5/5    Left Hip ABduction 5/5    Right Knee Flexion 5/5    Right Knee Extension 5/5    Left Knee Flexion 5/5   some mild medial knee discomfort   Left Knee Extension 5/5   some mild medial knee discomfort                          [facility] Adult PT Treatment/Exercise - 02/08/21 0826       Self-Care   Self-Care Other Self-Care Comments    Other Self-Care Comments  home cervical traction unit use, trying different settings with  patient doing 5-8 minutes of traction; patient able to return demo safe traction use      Manual Therapy   Manual Therapy Joint mobilization    Manual therapy comments mobilization with movement    Joint Mobilization end range overpressure for extension and rotation                         PT Long Term Goals - 02/08/21 1962       PT LONG TERM GOAL #1   Title Improve posture and alignment with patient to demonstrate improved upright posture of head and neck with posterior shoulder girdle engaged    Time 6    Period Weeks    Status Partially Met      PT LONG TERM GOAL #2   Title Increased cervical extension; lateral flexion; rotation by 10-15 degrees or more in all planes    Time 6    Period Weeks    Status Partially Met      PT LONG TERM GOAL #3   Title Decrease pain with functional activities and sleeping by 50-75% allowing patient to live more normally    Time 6    Period Weeks    Status Achieved      PT LONG TERM GOAL #4   Title Independent in HEP including aquatic exercise as appropriate    Time 6    Period Weeks    Status Partially Met      PT LONG TERM GOAL #5   Title Improve functional limitation score to 51    Time 6    Period Weeks    Status Achieved      PT LONG TERM GOAL #6   Title Increase AROM Lt knee to 100 deg flexion and 0 deg extension    Time 6    Period Weeks    Status Partially Met      PT LONG TERM GOAL #7   Title Increase strength Lt hip to 5/5    Baseline Patient has 4+/5 Left hip fleixon, 5/5 hip abduction and extension.    Time 6    Period Weeks    Status Partially Met      PT LONG TERM GOAL #8   Title Normal gait including step over step for stairs    Baseline Able to complete  without difficulty - 01/27/21    Time 6    Status Partially Met                   Plan - 02/08/21 0819     Clinical Impression Statement PT emphasized self management of cervical discomfort.  She got a home traction unit and PT and  patient trialed at neutral and then mid way for flexion.  We discussed home parameters.  The patient has comprehensive HEP for neck and for L knee.  She is progressing with LE strengthening with HEP. Plan to continue working towards updated LTGs.    PT Treatment/Interventions ADLs/Self Care Home Management;Aquatic Therapy;Cryotherapy;Electrical Stimulation;Iontophoresis 62m/ml Dexamethasone;Moist Heat;Ultrasound;Functional mobility training;Therapeutic activities;Therapeutic exercise;Neuromuscular re-education;Patient/family education;Manual techniques;Passive range of motion;Dry needling;Taping;Traction    PT Next Visit Plan L LE strenghtening and muscular endurance, shoulder/cervical strengthening and modalities as needed    PT Home Exercise Plan [email or web address]   Consulted and Agree with Plan of Care Patient             Patient will benefit from skilled therapeutic intervention in order to improve the following deficits and impairments:  Abnormal gait, Decreased range of motion, Increased fascial restricitons, Impaired UE functional use, Decreased activity tolerance, Pain, Hypomobility, Impaired flexibility, Improper body mechanics, Decreased mobility, Postural dysfunction, Decreased strength  Visit Diagnosis: Abnormal posture  Other symptoms and signs involving the musculoskeletal system  Cervicalgia  Muscle weakness (generalized)     Problem List Patient Active Problem List   Diagnosis Date Noted   Chronic pain of both knees 02/25/2020   Urinary incontinence 02/19/2019   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

## 2021-02-08 NOTE — Patient Instructions (Signed)
Home Traction:  Begin with 10 minutes 1-2 x/day for relief of neck discomfort.  You can go up to 10-20 minutes 2x/day as needed as long as you do not experience any pain or muscle soreness.   18-20# to begin x 10 minutes.

## 2021-02-10 ENCOUNTER — Ambulatory Visit (INDEPENDENT_AMBULATORY_CARE_PROVIDER_SITE_OTHER): Payer: PRIVATE HEALTH INSURANCE | Admitting: Physical Therapy

## 2021-02-10 ENCOUNTER — Other Ambulatory Visit: Payer: Self-pay

## 2021-02-10 DIAGNOSIS — R29898 Other symptoms and signs involving the musculoskeletal system: Secondary | ICD-10-CM | POA: Diagnosis not present

## 2021-02-10 DIAGNOSIS — M542 Cervicalgia: Secondary | ICD-10-CM

## 2021-02-10 DIAGNOSIS — R293 Abnormal posture: Secondary | ICD-10-CM | POA: Diagnosis not present

## 2021-02-10 DIAGNOSIS — M25562 Pain in left knee: Secondary | ICD-10-CM | POA: Diagnosis not present

## 2021-02-10 NOTE — Therapy (Signed)
[place], [place], [address] Phone: [phone]   Fax:  [phone]  Physical Therapy Treatment  Patient Details  Name: Lacey Barron MRN: [id number] Date of Birth: 06-24-1956 Referring Provider (PT): Cline Crock, [place]   Encounter Date: 02/10/2021   PT End of Session - 02/10/21 1105     Visit Number 25    Number of Visits 34    Date for PT Re-Evaluation 03/10/21    Authorization Type Approved 11Therapy sessions (02/08/2021-03/14/2021)  *The patient in notes has been approved initially for eval + 16, then 8, now an additional 11 viisits.    Authorization - Visit Number 2    Authorization - Number of Visits 11    PT Start Time [phone]    PT Stop Time 0935    PT Time Calculation (min) 48 min    Activity Tolerance Patient tolerated treatment well    Behavior During Therapy WFL for tasks assessed/performed             Past Medical History:  Diagnosis Date   Chronic knee pain    bilateral    Complication of anesthesia    hard to wake   GERD (gastroesophageal reflux disease)    History of abnormal cervical Pap smear 2019   Hypertension    followed by pcp   IDA (iron deficiency anemia)    Mild intermittent asthma    followed by pcp   OA (osteoarthritis)    shoulders and knees   PONV (postoperative nausea and vomiting)    Sickle cell trait (HCC)    SUI (stress urinary incontinence, female)    Uterine fibroid    Wears glasses    Wears partial dentures    upper and lower    Past Surgical History:  Procedure Laterality Date   COLONOSCOPY  per pt scheduled 05-06-2020   DILATATION & CURETTAGE/HYSTEROSCOPY WITH MYOSURE N/A 05/09/2020   Procedure: DILATATION & CURETTAGE/HYSTEROSCOPY WITH MYOSURE  RESECTION OF ENDOMETRIAL MASS;  Surgeon: Megan Salon, MD;  Location: [facility];  Service: Gynecology;  Laterality: N/A;  polyp resection   TUBAL LIGATION Bilateral  1980s    There were no vitals filed for this visit.   Subjective Assessment - 02/10/21 0848     Subjective Pt reports she reports the doctor wants her to do traction at therapy visits and do her own traction at night.  She reports she doesn't have enough time in morning to do home unit before work. Rt neck continues to get irritated by 2pm at work.    Patient Stated Goals "Being able to do what I need to do to relieve myself."  "I'm about getting there."    Currently in Pain? No/denies    Pain Location Neck    Pain Orientation Left    Pain Descriptors / Indicators Tingling                [facility] PT Assessment - 02/10/21 0001       Assessment   Medical Diagnosis Cervicalgia; Lt knee pain    Referring Provider (PT) Cline Crock, PA-C    Onset Date/Surgical Date 09/29/20    Hand Dominance Right      AROM   Left Knee Extension -3   supine with quad set   Left Knee Flexion 107   supine with AAROM from [facility]  Treatment/Exercise - 02/10/21 0001       Knee/Hip Exercises: Stretches   Ambulance person reps;Right;1 rep;30 seconds   seated hip flexor stretch position, thigh in neutral.   Gastroc Stretch Both;2 reps;30 seconds   incline board   Other Knee/Hip Stretches standing Lt hip adductor stretch leaning on counter, with Rt side lunge x 30 sec x 2 (irritated neck in leaned forward position)      Knee/Hip Exercises: Aerobic   Nustep L5 x 5 min LEs and UEs    Other Aerobic walking single laps around gym to assess response to exercises.      Knee/Hip Exercises: Standing   Step Down 10 reps;Step Height: 4";5 reps;Step Height: 6";Right   and retro step up for TKE     Shoulder Exercises: Stretch   Other Shoulder Stretches 3 position doorway stretch x 30 sec. Scalene stretch x 20 sec x 3 reps R, 1 rep L     Moist Heat Therapy   Number Minutes Moist Heat 15 Minutes    Moist Heat Location Cervical;Knee   during traction     Cryotherapy    Number Minutes Cryotherapy 15 Minutes    Cryotherapy Location Knee   Lt ant knee   Type of Cryotherapy Ice pack      Traction   Type of Traction Cervical    Min (lbs) 15    Max (lbs) 20    Hold Time 60    Rest Time 20    Time 15                    PT Education - 02/10/21 0919     Education Details Added seated hip flexor/quad stretch.                 PT Long Term Goals - 02/10/21 1056       PT LONG TERM GOAL #1   Title Improve posture and alignment with patient to demonstrate improved upright posture of head and neck with posterior shoulder girdle engaged    Time 6    Period Weeks    Status Partially Met      PT LONG TERM GOAL #2   Title Increased cervical extension; lateral flexion; rotation by 10-15 degrees or more in all planes    Time 6    Period Weeks    Status Partially Met      PT LONG TERM GOAL #3   Title Decrease pain with functional activities and sleeping by 50-75% allowing patient to live more normally    Time 6    Period Weeks    Status Achieved      PT LONG TERM GOAL #4   Title Independent in HEP including aquatic exercise as appropriate    Time 6    Period Weeks    Status Partially Met      PT LONG TERM GOAL #5   Title Improve functional limitation score to 51    Time 6    Period Weeks    Status Achieved      PT LONG TERM GOAL #6   Title Increase AROM Lt knee to 100 deg flexion and 0 deg extension    Baseline 3-107 deg - 02/10/21    Time 6    Period Weeks    Status Partially Met      PT LONG TERM GOAL #7   Title Increase strength Lt hip to 5/5    Baseline Patient has 4+/5 Left  hip fleixon, 5/5 hip abduction and extension.    Time 6    Period Weeks    Status Partially Met      PT LONG TERM GOAL #8   Title Normal gait including step over step for stairs    Baseline Able to complete without difficulty - 01/27/21    Time 6    Status Achieved                   Plan - 02/10/21 1100     Clinical Impression  Statement Trialed new exercises for Holle's LE and neck with good tolerance (retro step up, seated quad/hip flexor stretch, scalene stretch). Scalene stretch reduced some of the tension in Rt side of neck per pt report.  Pt continues to get relief with use of traction during sessions.  Lt knee ROM remains slightly limited.  Pt progressing well towards remaining goals.    Rehab Potential Good    PT Frequency 2x / week    PT Duration 6 weeks    PT Treatment/Interventions ADLs/Self Care Home Management;Aquatic Therapy;Cryotherapy;Electrical Stimulation;Iontophoresis 65m/ml Dexamethasone;Moist Heat;Ultrasound;Functional mobility training;Therapeutic activities;Therapeutic exercise;Neuromuscular re-education;Patient/family education;Manual techniques;Passive range of motion;Dry needling;Taping;Traction    PT Next Visit Plan L LE strenghtening and muscular endurance, shoulder/cervical strengthening and modalities as needed    PT Home Exercise Plan [id number]   Consulted and Agree with Plan of Care Patient             Patient will benefit from skilled therapeutic intervention in order to improve the following deficits and impairments:  Abnormal gait, Decreased range of motion, Increased fascial restricitons, Impaired UE functional use, Decreased activity tolerance, Pain, Hypomobility, Impaired flexibility, Improper body mechanics, Decreased mobility, Postural dysfunction, Decreased strength  Visit Diagnosis: Abnormal posture  Cervicalgia  Acute pain of left knee  Other symptoms and signs involving the musculoskeletal system     Problem List Patient Active Problem List   Diagnosis Date Noted   Chronic pain of both knees 02/25/2020   Urinary incontinence 02/19/2019   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

## 2021-02-13 ENCOUNTER — Encounter: Payer: Self-pay | Admitting: Rehabilitative and Restorative Service Providers"

## 2021-02-13 ENCOUNTER — Other Ambulatory Visit: Payer: Self-pay

## 2021-02-13 ENCOUNTER — Ambulatory Visit (INDEPENDENT_AMBULATORY_CARE_PROVIDER_SITE_OTHER): Payer: PRIVATE HEALTH INSURANCE | Admitting: Rehabilitative and Restorative Service Providers"

## 2021-02-13 DIAGNOSIS — R29898 Other symptoms and signs involving the musculoskeletal system: Secondary | ICD-10-CM

## 2021-02-13 DIAGNOSIS — M25562 Pain in left knee: Secondary | ICD-10-CM

## 2021-02-13 DIAGNOSIS — R293 Abnormal posture: Secondary | ICD-10-CM | POA: Diagnosis not present

## 2021-02-13 DIAGNOSIS — M542 Cervicalgia: Secondary | ICD-10-CM

## 2021-02-13 DIAGNOSIS — R2689 Other abnormalities of gait and mobility: Secondary | ICD-10-CM

## 2021-02-13 DIAGNOSIS — M6281 Muscle weakness (generalized): Secondary | ICD-10-CM

## 2021-02-13 NOTE — Therapy (Signed)
[place], [place], [address] Phone: [phone]   Fax:  [phone]  Physical Therapy Treatment  Patient Details  Name: Lacey Barron MRN: [id number] Date of Birth: [birth year] Referring Provider (PT): Cline Crock, [place]   Encounter Date: 02/13/2021   PT End of Session - 02/13/21 0809     Visit Number 26    Number of Visits 34    Date for PT Re-Evaluation 03/10/21    Authorization Type Approved 11Therapy sessions (02/08/2021-03/14/2021)  *The patient in notes has been approved initially for eval + 16, then 8, now an additional 11 viisits.    Authorization - Visit Number 3    Authorization - Number of Visits 11    PT Start Time 0845    PT Stop Time 0950    PT Time Calculation (min) 65 min    Activity Tolerance Patient tolerated treatment well             Past Medical History:  Diagnosis Date   Chronic knee pain    bilateral    Complication of anesthesia    hard to wake   GERD (gastroesophageal reflux disease)    History of abnormal cervical Pap smear 2019   Hypertension    followed by pcp   IDA (iron deficiency anemia)    Mild intermittent asthma    followed by pcp   OA (osteoarthritis)    shoulders and knees   PONV (postoperative nausea and vomiting)    Sickle cell trait (HCC)    SUI (stress urinary incontinence, female)    Uterine fibroid    Wears glasses    Wears partial dentures    upper and lower    Past Surgical History:  Procedure Laterality Date   COLONOSCOPY  per pt scheduled 05-06-2020   DILATATION & CURETTAGE/HYSTEROSCOPY WITH MYOSURE N/A 05/09/2020   Procedure: DILATATION & CURETTAGE/HYSTEROSCOPY WITH MYOSURE  RESECTION OF ENDOMETRIAL MASS;  Surgeon: Megan Salon, MD;  Location: [facility];  Service: Gynecology;  Laterality: N/A;  polyp resection   TUBAL LIGATION Bilateral 1980s    There were no vitals filed for this visit.    Subjective Assessment - 02/13/21 0811     Subjective Using her home traction at night which seems to help with the tension in the neck. Neck and shoulder are still tight - keeps tightening back up. Tried to wean from the tape for knee - went without tape for 5 days and knee pain increased significantly. Will try different pattern for weaning. Taping keeps knee "stable"  Heads to [place] on vacation 03/05/21    Currently in Pain? No/denies    Pain Location Neck    Pain Score 3    Pain Location Knee    Pain Orientation Left    Pain Descriptors / Indicators Discomfort    Pain Type Chronic pain    Pain Relieving Factors taping                [facility] PT Assessment - 02/13/21 0001       Assessment   Medical Diagnosis Cervicalgia; Lt knee pain    Referring Provider (PT) Cline Crock, PA-C    Onset Date/Surgical Date 09/29/20    Hand Dominance Right    Next MD Visit 03/14/21    Prior Therapy none      Posture/Postural Control   Posture Comments head forward; shouders rounded and elevated; head of the humerus anterior in orientation;  increased thoracic kyphosis; standing with more equalized wt shift      Palpation   Palpation comment muscular tightness around Lt knee ant/post/medially; continued muscular tightness through Rt > Lt pecs; ant/lat/post cervical musculature; cervical and thoracic paraspinals; upper traps; periscapular musculature      Special Tests    Special Tests Cervical                           [facility] Adult PT Treatment/Exercise - 02/13/21 0001       Neck Exercises: Seated   Neck Retraction 5 reps;5 secs    Cervical Rotation Right;Left;5 reps    Lateral Flexion Right;Left;5 reps      Knee/Hip Exercises: Stretches   Gastroc Stretch Both;2 reps;30 seconds   incline board     Knee/Hip Exercises: Aerobic   Nustep L6 x 6 min LEs and UEs      Knee/Hip Exercises: Standing   Lateral Step Up Left;10 reps;Hand Hold: 1;Step Height: 6"     Forward Step Up Left;10 reps;Hand Hold: 2;Step Height: 6"    Walking with Sports Cord fwd/each side/back x 5 each      Shoulder Exercises: Seated   Other Seated Exercises thoracic rotation seated      Shoulder Exercises: Standing   Diagonals AROM;Right;Left;5 reps   arm rainbow facing wall     Shoulder Exercises: Stretch   Other Shoulder Stretches 3 position doorway stretch x 30 sec.      Moist Heat Therapy   Number Minutes Moist Heat 15 Minutes    Moist Heat Location Cervical;Knee   during traction     Cryotherapy   Number Minutes Cryotherapy 15 Minutes    Cryotherapy Location Knee   Lt ant knee   Type of Cryotherapy Ice pack      Traction   Type of Traction Cervical    Min (lbs) 20    Max (lbs) 26    Hold Time 60    Rest Time 20    Time 15      Manual Therapy   Manual therapy comments skilled palpation to assess response to DN and manual work    Joint Mobilization cervical PA mobs/lateral mobs    Soft tissue mobilization deep tissue work through the cervical and upper trap musculature    Myofascial Release upper thoracic musculature              Trigger Point Dry Needling - 02/13/21 0001     Consent Given? Yes    Education Handout Provided Previously provided    Other Dry Needling bilat    Upper Trapezius Response Palpable increased muscle length    Suboccipitals Response Palpable increased muscle length    Levator Scapulae Response Palpable increased muscle length    Splenius capitus Response Palpable increased muscle length    Cervical multifidi Response Palpable increased muscle length                       PT Long Term Goals - 02/10/21 1056       PT LONG TERM GOAL #1   Title Improve posture and alignment with patient to demonstrate improved upright posture of head and neck with posterior shoulder girdle engaged    Time 6    Period Weeks    Status Partially Met      PT LONG TERM GOAL #2   Title Increased cervical extension; lateral  flexion; rotation by 10-15 degrees  or more in all planes    Time 6    Period Weeks    Status Partially Met      PT LONG TERM GOAL #3   Title Decrease pain with functional activities and sleeping by 50-75% allowing patient to live more normally    Time 6    Period Weeks    Status Achieved      PT LONG TERM GOAL #4   Title Independent in HEP including aquatic exercise as appropriate    Time 6    Period Weeks    Status Partially Met      PT LONG TERM GOAL #5   Title Improve functional limitation score to 51    Time 6    Period Weeks    Status Achieved      PT LONG TERM GOAL #6   Title Increase AROM Lt knee to 100 deg flexion and 0 deg extension    Baseline 3-107 deg - 02/10/21    Time 6    Period Weeks    Status Partially Met      PT LONG TERM GOAL #7   Title Increase strength Lt hip to 5/5    Baseline Patient has 4+/5 Left hip fleixon, 5/5 hip abduction and extension.    Time 6    Period Weeks    Status Partially Met      PT LONG TERM GOAL #8   Title Normal gait including step over step for stairs    Baseline Able to complete without difficulty - 01/27/21    Time 6    Status Achieved                   Plan - 02/13/21 0821     Clinical Impression Statement Patient continues to work on HEP and has incoporated home traction in the evening which helps with tension in the shoulder. Good response to Dn and manual work through the cervical and Rt shoulder girdle. Added stretngthening exercises for LE's with sports cord. Progressing gradually toward stated goals of therapy.    Rehab Potential Good    PT Frequency 2x / week    PT Duration 6 weeks    PT Treatment/Interventions ADLs/Self Care Home Management;Aquatic Therapy;Cryotherapy;Electrical Stimulation;Iontophoresis 36m/ml Dexamethasone;Moist Heat;Ultrasound;Functional mobility training;Therapeutic activities;Therapeutic exercise;Neuromuscular re-education;Patient/family education;Manual techniques;Passive range of  motion;Dry needling;Taping;Traction    PT Next Visit Plan L LE strenghtening and muscular endurance, shoulder/cervical strengthening and modalities as needed    PT Home Exercise Plan [id number]   Consulted and Agree with Plan of Care Patient             Patient will benefit from skilled therapeutic intervention in order to improve the following deficits and impairments:     Visit Diagnosis: Abnormal posture  Cervicalgia  Acute pain of left knee  Other symptoms and signs involving the musculoskeletal system  Muscle weakness (generalized)  Other abnormalities of gait and mobility     Problem List Patient Active Problem List   Diagnosis Date Noted   Chronic pain of both knees 02/25/2020   Urinary incontinence 02/19/2019   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

## 2021-02-16 ENCOUNTER — Ambulatory Visit (INDEPENDENT_AMBULATORY_CARE_PROVIDER_SITE_OTHER): Payer: PRIVATE HEALTH INSURANCE | Admitting: Rehabilitative and Restorative Service Providers"

## 2021-02-16 ENCOUNTER — Other Ambulatory Visit: Payer: Self-pay

## 2021-02-16 ENCOUNTER — Encounter: Payer: Self-pay | Admitting: Rehabilitative and Restorative Service Providers"

## 2021-02-16 DIAGNOSIS — R29898 Other symptoms and signs involving the musculoskeletal system: Secondary | ICD-10-CM | POA: Diagnosis not present

## 2021-02-16 DIAGNOSIS — M542 Cervicalgia: Secondary | ICD-10-CM | POA: Diagnosis not present

## 2021-02-16 DIAGNOSIS — R293 Abnormal posture: Secondary | ICD-10-CM | POA: Diagnosis not present

## 2021-02-16 DIAGNOSIS — M6281 Muscle weakness (generalized): Secondary | ICD-10-CM

## 2021-02-16 DIAGNOSIS — M25562 Pain in left knee: Secondary | ICD-10-CM | POA: Diagnosis not present

## 2021-02-16 DIAGNOSIS — R2689 Other abnormalities of gait and mobility: Secondary | ICD-10-CM

## 2021-02-16 NOTE — Therapy (Signed)
[facility] [address] [place], [place], [address] Phone: [phone]   Fax:  [phone]  Physical Therapy Treatment  Patient Details  Name: Lacey Barron MRN: [id number] Date of Birth: 01/13/1956 Referring Provider (PT): Thomas Bradley Dixon, PA-C   Encounter Date: 02/16/2021   PT End of Session - 02/16/21 0804     Visit Number 27    Number of Visits 34    Date for PT Re-Evaluation 03/10/21    Authorization Type Approved 11Therapy sessions (02/08/2021-03/14/2021)  *The patient in notes has been approved initially for eval + 16, then 8, now an additional 11 viisits.    Authorization - Visit Number 4    Authorization - Number of Visits 11    PT Start Time 0800    PT Stop Time 0851    PT Time Calculation (min) 51 min    Activity Tolerance Patient tolerated treatment well             Past Medical History:  Diagnosis Date   Chronic knee pain    bilateral    Complication of anesthesia    hard to wake   GERD (gastroesophageal reflux disease)    History of abnormal cervical Pap smear 2019   Hypertension    followed by pcp   IDA (iron deficiency anemia)    Mild intermittent asthma    followed by pcp   OA (osteoarthritis)    shoulders and knees   PONV (postoperative nausea and vomiting)    Sickle cell trait (HCC)    SUI (stress urinary incontinence, female)    Uterine fibroid    Wears glasses    Wears partial dentures    upper and lower    Past Surgical History:  Procedure Laterality Date   COLONOSCOPY  per pt scheduled 05-06-2020   DILATATION & CURETTAGE/HYSTEROSCOPY WITH MYOSURE N/A 05/09/2020   Procedure: DILATATION & CURETTAGE/HYSTEROSCOPY WITH MYOSURE  RESECTION OF ENDOMETRIAL MASS;  Surgeon: Miller, Mary S, MD;  Location: [facility];  Service: Gynecology;  Laterality: N/A;  polyp resection   TUBAL LIGATION Bilateral 1980s    There were no vitals filed for this visit.    Subjective Assessment - 02/16/21 0804     Subjective Patient reports that she has increased knee pain from getting in and out of the floor for her home cervical traction. She is trying to modify this and it is helping. Tractioin does help with cervical pain. She has a good response to DN. Rt UE pain is improved. But by the time she gets home she is having a "shot" in the Rt > Lt arms which may last 30 min.    Currently in Pain? No/denies    Pain Score 3    Pain Location Knee    Pain Orientation Left    Pain Descriptors / Indicators Tender    Pain Type Chronic pain                               [facility] Adult PT Treatment/Exercise - 02/16/21 0001       Self-Care   Other Self-Care Comments  theracane      Knee/Hip Exercises: Stretches   Passive Hamstring Stretch Left;2 reps;Right;1 rep;20 seconds    Gastroc Stretch Both;2 reps;30 seconds   incline board     Knee/Hip Exercises: Aerobic   Nustep L6 x 7 min LEs and UEs 10        Knee/Hip Exercises: Standing   Terminal Knee Extension Strengthening;Left;2 sets;10 reps;Theraband    Theraband Level (Terminal Knee Extension) Level 3 (Green)    Lateral Step Up Left;10 reps;Hand Hold: 1;Step Height: 6"    Lateral Step Up Limitations repeated with heel tap 4 inch step    Forward Step Up Left;10 reps;Hand Hold: 2;Step Height: 6"    Step Down Right;10 reps;Step Height: 4"    Functional Squat 10 reps    Functional Squat Limitations repeated on incline board x 10    SLS 20 sec x 4 reps UE support as needed      Moist Heat Therapy   Number Minutes Moist Heat 15 Minutes    Moist Heat Location Cervical;Knee   during traction     Cryotherapy   Number Minutes Cryotherapy 15 Minutes    Cryotherapy Location Knee   Lt ant knee   Type of Cryotherapy Ice pack      Traction   Type of Traction Cervical    Min (lbs) 20    Max (lbs) 26    Hold Time 60    Rest Time 20    Time 15                    PT Education -  02/16/21 0828     Education Details HEP    Person(s) Educated Patient    Methods Explanation;Demonstration;Tactile cues;Verbal cues;Handout    Comprehension Verbalized understanding;Returned demonstration;Verbal cues required;Tactile cues required                 PT Long Term Goals - 02/10/21 1056       PT LONG TERM GOAL #1   Title Improve posture and alignment with patient to demonstrate improved upright posture of head and neck with posterior shoulder girdle engaged    Time 6    Period Weeks    Status Partially Met      PT LONG TERM GOAL #2   Title Increased cervical extension; lateral flexion; rotation by 10-15 degrees or more in all planes    Time 6    Period Weeks    Status Partially Met      PT LONG TERM GOAL #3   Title Decrease pain with functional activities and sleeping by 50-75% allowing patient to live more normally    Time 6    Period Weeks    Status Achieved      PT LONG TERM GOAL #4   Title Independent in HEP including aquatic exercise as appropriate    Time 6    Period Weeks    Status Partially Met      PT LONG TERM GOAL #5   Title Improve functional limitation score to 51    Time 6    Period Weeks    Status Achieved      PT LONG TERM GOAL #6   Title Increase AROM Lt knee to 100 deg flexion and 0 deg extension    Baseline 3-107 deg - 02/10/21    Time 6    Period Weeks    Status Partially Met      PT LONG TERM GOAL #7   Title Increase strength Lt hip to 5/5    Baseline Patient has 4+/5 Left hip fleixon, 5/5 hip abduction and extension.    Time 6    Period Weeks    Status Partially Met      PT LONG TERM GOAL #8   Title Normal gait including   step over step for stairs    Baseline Able to complete without difficulty - 01/27/21    Time 6    Status Achieved                   Plan - 02/16/21 0810     Clinical Impression Statement Patient reports that she is continuing to improve overall. Less neck and UE pain. She note increase in  UE pain and tightness at the end of her work. She can stretch and move and get rid of most of the symptoms. Suggested patient try using the home cervical tractioin as soon as she gets home and then again before bed. Patient continues to progress with LE strengthening. Note increasing tissue extensibility and progression of strengthening exercises.    Rehab Potential Good    PT Frequency 2x / week    PT Duration 6 weeks    PT Treatment/Interventions ADLs/Self Care Home Management;Aquatic Therapy;Cryotherapy;Electrical Stimulation;Iontophoresis 75m/ml Dexamethasone;Moist Heat;Ultrasound;Functional mobility training;Therapeutic activities;Therapeutic exercise;Neuromuscular re-education;Patient/family education;Manual techniques;Passive range of motion;Dry needling;Taping;Traction    PT Next Visit Plan L LE strenghtening and muscular endurance, shoulder/cervical strengthening and modalities as needed    PT Home Exercise Plan [id number]   Consulted and Agree with Plan of Care Patient             Patient will benefit from skilled therapeutic intervention in order to improve the following deficits and impairments:     Visit Diagnosis: Abnormal posture  Cervicalgia  Acute pain of left knee  Other symptoms and signs involving the musculoskeletal system  Muscle weakness (generalized)  Other abnormalities of gait and mobility     Problem List Patient Active Problem List   Diagnosis Date Noted   Chronic pain of both knees 02/25/2020   Urinary incontinence 02/19/2019   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

## 2021-02-16 NOTE — Patient Instructions (Signed)
Access Code: [email or web address]: [email or web address]: 06/30/2022Prepared by: Bronda Alfred HoltExercises  Upper Cervical Extension SNAG with Strap - 2 x daily - 7 x weekly - 1 sets - 5 reps - 5-10 sec hold  Seated Cervical Sidebending AROM - 2 x daily - 7 x weekly - 1 sets - 5 reps - 5-10 sec hold  Seated Cervical Rotation AROM - 2 x daily - 7 x weekly - 1 sets - 5 reps - 2-3 sec hold  Seated Shoulder W - 4-5 x daily - 7 x weekly - 1 sets - 5-10 reps - 5 sec hold  Standing Backward Shoulder Rolls - 2 x daily - 7 x weekly - 1 sets - 10 reps - 1-2 sec hold  Doorway Pec Stretch at 60 Degrees Abduction - 3 x daily - 7 x weekly - 3 reps - 1 sets  Doorway Pec Stretch at 120 Degrees Abduction - 3 x daily - 7 x weekly - 3 reps - 1 sets - 30 second hold hold  Bilateral Scapular Depression with Anchored Resistance - Straight Arm - 2 x daily - 7 x weekly - 1 sets - 10 reps - 5 sec hold  Standing Shoulder Horizontal Abduction with Resistance - 2 x daily - 7 x weekly - 1 sets - 10 reps  Standing Bilateral Low Shoulder Row with Anchored Resistance - 2 x daily - 7 x weekly - 1-3 sets - 10 reps - 2-3 sec hold  Seated Thoracic Extension and Rotation with Reach - 1 x daily - 7 x weekly - 3 sets - 10 reps  Sit to Stand - 2 x daily - 7 x weekly - 1 sets - 10 reps  Seated Hamstring Stretch - 2 x daily - 7 x weekly - 1 sets - 3 reps - 30 sec hold  Single Leg Stance - 2 x daily - 7 x weekly - 2 sets - 5 reps - 20 sec hold  Gastroc Stretch on Wall - 2 x daily - 7 x weekly - 1 sets - 3 reps - 30 sec hold  Step Up - 2 x daily - 7 x weekly - 1 sets - 10 reps - 2 sec hold  Lateral Step Up - 2 x daily - 7 x weekly - 2 sets - 10 reps - 2 sec hold  Standing Terminal Knee Extension with Resistance - 2 x daily - 7 x weekly - 2-3 sets - 10 reps - 3-5 sec hold

## 2021-02-21 ENCOUNTER — Ambulatory Visit (INDEPENDENT_AMBULATORY_CARE_PROVIDER_SITE_OTHER): Payer: PRIVATE HEALTH INSURANCE | Admitting: Rehabilitative and Restorative Service Providers"

## 2021-02-21 ENCOUNTER — Other Ambulatory Visit: Payer: Self-pay

## 2021-02-21 DIAGNOSIS — R293 Abnormal posture: Secondary | ICD-10-CM | POA: Diagnosis not present

## 2021-02-21 DIAGNOSIS — R29898 Other symptoms and signs involving the musculoskeletal system: Secondary | ICD-10-CM

## 2021-02-21 DIAGNOSIS — M25562 Pain in left knee: Secondary | ICD-10-CM

## 2021-02-21 DIAGNOSIS — M542 Cervicalgia: Secondary | ICD-10-CM

## 2021-02-21 DIAGNOSIS — M6281 Muscle weakness (generalized): Secondary | ICD-10-CM

## 2021-02-21 DIAGNOSIS — R2689 Other abnormalities of gait and mobility: Secondary | ICD-10-CM

## 2021-02-21 NOTE — Therapy (Signed)
[place], [place], [address] Phone: [phone]   Fax:  [phone]  Physical Therapy Treatment  Patient Details  Name: Lacey Barron MRN: [id number] Date of Birth: Feb 26, 1956 Referring Provider (PT): Cline Crock, [place]   Encounter Date: 02/21/2021   PT End of Session - 02/21/21 0807     Visit Number 28    Number of Visits 34    Date for PT Re-Evaluation 03/10/21    Authorization Type Approved 11Therapy sessions (02/08/2021-03/14/2021)  *The patient in notes has been approved initially for eval + 16, then 8, now an additional 11 viisits.    Authorization - Visit Number 5    Authorization - Number of Visits 11    PT Start Time 0805    PT Stop Time 8563    PT Time Calculation (min) 49 min    Activity Tolerance Patient tolerated treatment well             Past Medical History:  Diagnosis Date   Chronic knee pain    bilateral    Complication of anesthesia    hard to wake   GERD (gastroesophageal reflux disease)    History of abnormal cervical Pap smear 2019   Hypertension    followed by pcp   IDA (iron deficiency anemia)    Mild intermittent asthma    followed by pcp   OA (osteoarthritis)    shoulders and knees   PONV (postoperative nausea and vomiting)    Sickle cell trait (HCC)    SUI (stress urinary incontinence, female)    Uterine fibroid    Wears glasses    Wears partial dentures    upper and lower    Past Surgical History:  Procedure Laterality Date   COLONOSCOPY  per pt scheduled 05-06-2020   DILATATION & CURETTAGE/HYSTEROSCOPY WITH MYOSURE N/A 05/09/2020   Procedure: DILATATION & CURETTAGE/HYSTEROSCOPY WITH MYOSURE  RESECTION OF ENDOMETRIAL MASS;  Surgeon: Megan Salon, MD;  Location: [place] [facility];  Service: Gynecology;  Laterality: N/A;  polyp resection   TUBAL LIGATION Bilateral 1980s    There were no vitals filed for this visit.    Subjective Assessment - 02/21/21 0807     Subjective Patient reports contoinued discomfort in Lt knee somedays worse than oterhe. She is not getting in and out of the floor for the traction for the neck. She is now using the traction on the bed. Tight through the Rt side of the neck.    Currently in Pain? Yes    Pain Score 1     Pain Orientation Right    Pain Descriptors / Indicators Tightness    Pain Type Acute pain;Chronic pain    Pain Radiating Towards Rt shoulder area    Pain Onset More than a month ago    Pain Frequency Intermittent    Aggravating Factors  activities using UE's; bending forward    Pain Relieving Factors TENS; heat; massage    Pain Score 3    Pain Location Knee    Pain Orientation Left    Pain Descriptors / Indicators Tender;Tightness    Pain Type Chronic pain                [facility] PT Assessment - 02/21/21 0001       Assessment   Medical Diagnosis Cervicalgia; Lt knee pain    Referring Provider (PT) Cline Crock, PA-C    Onset Date/Surgical Date 09/29/20  Hand Dominance Right    Next MD Visit 03/14/21    Prior Therapy none      AROM   Left Knee Extension -3   supine with quad set   Left Knee Flexion 90   prone     Palpation   Palpation comment muscular tightness around Lt knee ant/post/medially; continued muscular tightness through Rt > Lt pecs; ant/lat/post cervical musculature; cervical and thoracic paraspinals; upper traps; periscapular musculature                           [facility] Adult PT Treatment/Exercise - 02/21/21 0001       Knee/Hip Exercises: Stretches   Quad Stretch Left;4 reps;20 seconds;30 seconds   prone with PT assist repeated with pt using strap     Knee/Hip Exercises: Aerobic   Nustep L6 x 5 min LEs and UEs 10      Shoulder Exercises: ROM/Strengthening   UBE (Upper Arm Bike) L5 x 5 min alt fwd/bkwd      Shoulder Exercises: Stretch   Other Shoulder Stretches 3 position doorway stretch x 30 sec.     Other Shoulder Stretches at wall shoulder clock 3 pm to noon x 10; T for stretch through the pecs 20-30 sec x 3 rep each side      Moist Heat Therapy   Number Minutes Moist Heat 15 Minutes    Moist Heat Location Cervical;Knee   during traction     Cryotherapy   Number Minutes Cryotherapy 15 Minutes    Cryotherapy Location Knee   Lt ant knee   Type of Cryotherapy Ice pack      Traction   Type of Traction Cervical    Min (lbs) 22    Max (lbs) 28    Hold Time 60    Rest Time 20    Time 15      Manual Therapy   Manual therapy comments skilled palpation to assess response to DN and manual work    Joint Mobilization cervical PA mobs/lateral mobs    Soft tissue mobilization deep tissue work through the cervical and upper trap musculature    Myofascial Release upper thoracic musculature              Trigger Point Dry Needling - 02/21/21 0001     Consent Given? Yes    Education Handout Provided Previously provided    Other Dry Needling bilat    Upper Trapezius Response Palpable increased muscle length    Suboccipitals Response Palpable increased muscle length    Splenius capitus Response Palpable increased muscle length    Cervical multifidi Response Palpable increased muscle length                       PT Long Term Goals - 02/10/21 1056       PT LONG TERM GOAL #1   Title Improve posture and alignment with patient to demonstrate improved upright posture of head and neck with posterior shoulder girdle engaged    Time 6    Period Weeks    Status Partially Met      PT LONG TERM GOAL #2   Title Increased cervical extension; lateral flexion; rotation by 10-15 degrees or more in all planes    Time 6    Period Weeks    Status Partially Met      PT LONG TERM GOAL #3   Title Decrease pain with functional activities  and sleeping by 50-75% allowing patient to live more normally    Time 6    Period Weeks    Status Achieved      PT LONG TERM GOAL #4   Title  Independent in HEP including aquatic exercise as appropriate    Time 6    Period Weeks    Status Partially Met      PT LONG TERM GOAL #5   Title Improve functional limitation score to 51    Time 6    Period Weeks    Status Achieved      PT LONG TERM GOAL #6   Title Increase AROM Lt knee to 100 deg flexion and 0 deg extension    Baseline 3-107 deg - 02/10/21    Time 6    Period Weeks    Status Partially Met      PT LONG TERM GOAL #7   Title Increase strength Lt hip to 5/5    Baseline Patient has 4+/5 Left hip fleixon, 5/5 hip abduction and extension.    Time 6    Period Weeks    Status Partially Met      PT LONG TERM GOAL #8   Title Normal gait including step over step for stairs    Baseline Able to complete without difficulty - 01/27/21    Time 6    Status Achieved                   Plan - 02/21/21 0813     Clinical Impression Statement Persistnet intermittent symptoms in Rt cerrvical and shoulder area as well as Lt knee. Patient continues to work on HEP and has a home cervical traction unit. She has persistent tightness in the cervical musculature.    Rehab Potential Good    PT Frequency 2x / week    PT Duration 6 weeks    PT Treatment/Interventions ADLs/Self Care Home Management;Aquatic Therapy;Cryotherapy;Electrical Stimulation;Iontophoresis 62m/ml Dexamethasone;Moist Heat;Ultrasound;Functional mobility training;Therapeutic activities;Therapeutic exercise;Neuromuscular re-education;Patient/family education;Manual techniques;Passive range of motion;Dry needling;Taping;Traction    PT Next Visit Plan L LE strenghtening and muscular endurance, shoulder/cervical strengthening and modalities as needed    PT Home Exercise Plan [email or web address]   Consulted and Agree with Plan of Care Patient             Patient will benefit from skilled therapeutic intervention in order to improve the following deficits and impairments:     Visit Diagnosis: Abnormal  posture  Cervicalgia  Acute pain of left knee  Other symptoms and signs involving the musculoskeletal system  Muscle weakness (generalized)  Other abnormalities of gait and mobility     Problem List Patient Active Problem List   Diagnosis Date Noted   Chronic pain of both knees 02/25/2020   Urinary incontinence 02/19/2019   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

## 2021-02-22 NOTE — Progress Notes (Deleted)
    SUBJECTIVE:   Chief compliant/HPI: annual examination  Lacey Barron is a 65 y.o. who presents today for an annual exam.    History tabs reviewed and updated ***.   Review of systems form reviewed and notable for ***.   OBJECTIVE:   BP 122/78   Pulse 97   Ht $R'5\' 6"'cD$  (1.676 m)   Wt 215 lb 6.4 oz (97.7 kg)   LMP 10/14/2013   SpO2 96%   BMI 34.77 kg/m  *** General: Awake, alert, oriented, in no acute distress, pleasant and cooperative with examination HEENT: Normocephalic, atraumatic, nares patent, dentition is good, oropharynx without erythema or exudates, TM's clear bilaterally, no thyroid nodules palpated Cardio: RRR without murmur, 2+ radial, DP and PT pulses b/l Respiratory: CTAB without wheezing/rhonchi/rales Abdomen: Soft, non-tender to palpation of all quadrants, non-distended, no rebound/guarding, no organomegaly MSK: Able to move all extremities spontaneously, good muscle strength, no abnormalities Extremities: without edema or cyanosis Neuro: Speech is clear and intact, no focal deficits, no facial asymmetry, follows commands  Psych: Normal mood and affect   ASSESSMENT/PLAN:   No problem-specific Assessment & Plan notes found for this encounter.    Annual Examination  See AVS for age appropriate recommendations  PHQ score ***, reviewed and discussed.  BP reviewed and at goal ***.  Asked about intimate partner violence and resources given as appropriate  Advance directives discussion ***  Considered the following items based upon USPSTF recommendations: Diabetes screening: {discussed/ordered:14545} Screening for elevated cholesterol: {discussed/ordered:14545} HIV testing: {discussed/ordered:14545} Hepatitis C: {discussed/ordered:14545} Hepatitis B: {discussed/ordered:14545} Syphilis if at high risk: {discussed/ordered:14545} GC/CT {GC/CT screening :23818} Osteoporosis screening considered based upon risk of fracture from [facility] calculator. Major  osteoporotic fracture risk is ***%. DEXA {ordered not order:23822}.  Reviewed risk factors for latent tuberculosis and {not indicated/requested/declined:14582}   Discussed family history, BRCA testing {not indicated/requested/declined:14582}. Tool used to risk stratify was ***.  Cervical cancer screening: prior Pap reviewed, repeat due in 02/2025 Breast cancer screening:  up to date, next mammogram due 03/2022 Colorectal cancer screening: up to date on screening for CRC. Last colonoscopy 04/2020 which showed two serrated sessile polyps without cytologic dysplasia  Lung cancer screening:  Not ordered, patient is non-smoker .  Vaccinations: Zoster vaccine, Tetnus, COVID-19 booster ***.   Follow up in 1 year or sooner if indicated.    Sharion Settler, [place]

## 2021-02-23 ENCOUNTER — Other Ambulatory Visit: Payer: Self-pay

## 2021-02-23 ENCOUNTER — Encounter: Payer: Self-pay | Admitting: Rehabilitative and Restorative Service Providers"

## 2021-02-23 ENCOUNTER — Ambulatory Visit (INDEPENDENT_AMBULATORY_CARE_PROVIDER_SITE_OTHER): Payer: PRIVATE HEALTH INSURANCE | Admitting: Rehabilitative and Restorative Service Providers"

## 2021-02-23 DIAGNOSIS — R29898 Other symptoms and signs involving the musculoskeletal system: Secondary | ICD-10-CM | POA: Diagnosis not present

## 2021-02-23 DIAGNOSIS — R2689 Other abnormalities of gait and mobility: Secondary | ICD-10-CM

## 2021-02-23 DIAGNOSIS — R293 Abnormal posture: Secondary | ICD-10-CM

## 2021-02-23 DIAGNOSIS — M25562 Pain in left knee: Secondary | ICD-10-CM

## 2021-02-23 DIAGNOSIS — M6281 Muscle weakness (generalized): Secondary | ICD-10-CM

## 2021-02-23 DIAGNOSIS — M542 Cervicalgia: Secondary | ICD-10-CM

## 2021-02-23 NOTE — Therapy (Signed)
[place], [place], [address] Phone: [phone]   Fax:  [phone]  Physical Therapy Treatment  Patient Details  Name: Lacey Barron MRN: [id number] Date of Birth: 06/15/1956 Referring Provider (PT): Cline Crock, [place]   Encounter Date: 02/23/2021   PT End of Session - 02/23/21 0804     Visit Number 29    Number of Visits 34    Date for PT Re-Evaluation 03/10/21    Authorization Type Approved 11Therapy sessions (02/08/2021-03/14/2021)  *The patient in notes has been approved initially for eval + 16, then 8, now an additional 11 viisits.    Authorization - Visit Number 6    Authorization - Number of Visits 11    PT Start Time 0800    PT Stop Time [id number]    PT Time Calculation (min) 52 min    Activity Tolerance Patient tolerated treatment well             Past Medical History:  Diagnosis Date   Chronic knee pain    bilateral    Complication of anesthesia    hard to wake   GERD (gastroesophageal reflux disease)    History of abnormal cervical Pap smear 2019   Hypertension    followed by pcp   IDA (iron deficiency anemia)    Mild intermittent asthma    followed by pcp   OA (osteoarthritis)    shoulders and knees   PONV (postoperative nausea and vomiting)    Sickle cell trait (HCC)    SUI (stress urinary incontinence, female)    Uterine fibroid    Wears glasses    Wears partial dentures    upper and lower    Past Surgical History:  Procedure Laterality Date   COLONOSCOPY  per pt scheduled 05-06-2020   DILATATION & CURETTAGE/HYSTEROSCOPY WITH MYOSURE N/A 05/09/2020   Procedure: DILATATION & CURETTAGE/HYSTEROSCOPY WITH MYOSURE  RESECTION OF ENDOMETRIAL MASS;  Surgeon: Megan Salon, MD;  Location: [facility] [place];  Service: Gynecology;  Laterality: N/A;  polyp resection   TUBAL LIGATION Bilateral 1980s    There were no vitals filed for this visit.    Subjective Assessment - 02/23/21 0806     Subjective Lt more a problems today than neck. Knee pain increased with getting in and out of the floor for cervical traction - now doing traction on the bed which has helped some. Knee worse in the evening. Stil using tape which helps.    Currently in Pain? Yes    Pain Score 1     Pain Location Neck    Pain Orientation Right    Pain Descriptors / Indicators Tightness    Pain Type Acute pain;Chronic pain    Pain Score 3    Pain Location Knee    Pain Orientation Left    Pain Descriptors / Indicators Tender;Tightness    Pain Type Chronic pain                [facility] PT Assessment - 02/23/21 0001       Assessment   Medical Diagnosis Cervicalgia; Lt knee pain    Referring Provider (PT) Cline Crock, PA-C    Onset Date/Surgical Date 09/29/20    Hand Dominance Right    Next MD Visit 03/14/21    Prior Therapy none      AROM   Left Knee Extension -5   supine with quad set   Left Knee Flexion 93  Strength   Right Hip Flexion 5/5    Right Hip Extension 5/5    Right Hip ABduction 5/5    Left Hip Flexion 4+/5    Left Hip Extension 5/5    Left Hip ABduction 5/5      Flexibility   Hamstrings tight Rt 92 deg Lt 85 deg      Palpation   Palpation comment muscular tightness around Lt knee ant/post/medially; continued muscular tightness through Rt > Lt pecs; ant/lat/post cervical musculature; cervical and thoracic paraspinals; upper traps; periscapular musculature                           [facility] Adult PT Treatment/Exercise - 02/23/21 0001       Neck Exercises: Seated   Neck Retraction 5 reps;5 secs    Cervical Rotation Right;Left;5 reps    Lateral Flexion Right;Left;5 reps      Knee/Hip Exercises: Stretches   Passive Hamstring Stretch Left;4 reps;30 seconds   supine with strap pushing Lt knee into extension with stretch   Quad Stretch Left;4 reps;20 seconds;30 seconds   prone with PT assist repeated with pt  using strap   Gastroc Stretch Both;2 reps;30 seconds   slant board     Knee/Hip Exercises: Aerobic   Nustep L6 x 6 min LEs and UEs 10      Knee/Hip Exercises: Seated   Other Seated Knee/Hip Exercises knee flexion and extension in sitting x 10      Knee/Hip Exercises: Supine   Quad Sets Strengthening;Left;10 reps      Shoulder Exercises: ROM/Strengthening   UBE (Upper Arm Bike) L5 x 4 min alt fwd/bkwd      Moist Heat Therapy   Number Minutes Moist Heat 15 Minutes    Moist Heat Location Cervical;Knee   during traction     Traction   Type of Traction Cervical    Min (lbs) 22    Max (lbs) 28    Hold Time 60    Rest Time 20    Time 15      Manual Therapy   Other Manual Therapy IASTM peripatellar area; Quads Lt LE                         PT Long Term Goals - 02/10/21 1056       PT LONG TERM GOAL #1   Title Improve posture and alignment with patient to demonstrate improved upright posture of head and neck with posterior shoulder girdle engaged    Time 6    Period Weeks    Status Partially Met      PT LONG TERM GOAL #2   Title Increased cervical extension; lateral flexion; rotation by 10-15 degrees or more in all planes    Time 6    Period Weeks    Status Partially Met      PT LONG TERM GOAL #3   Title Decrease pain with functional activities and sleeping by 50-75% allowing patient to live more normally    Time 6    Period Weeks    Status Achieved      PT LONG TERM GOAL #4   Title Independent in HEP including aquatic exercise as appropriate    Time 6    Period Weeks    Status Partially Met      PT LONG TERM GOAL #5   Title Improve functional limitation score to 51  Time 6    Period Weeks    Status Achieved      PT LONG TERM GOAL #6   Title Increase AROM Lt knee to 100 deg flexion and 0 deg extension    Baseline 3-107 deg - 02/10/21    Time 6    Period Weeks    Status Partially Met      PT LONG TERM GOAL #7   Title Increase strength Lt  hip to 5/5    Baseline Patient has 4+/5 Left hip fleixon, 5/5 hip abduction and extension.    Time 6    Period Weeks    Status Partially Met      PT LONG TERM GOAL #8   Title Normal gait including step over step for stairs    Baseline Able to complete without difficulty - 01/27/21    Time 6    Status Achieved                   Plan - 02/23/21 0805     Clinical Impression Statement Knee continues to be irritatied from when she was getting in and out of the floor for cervical traction. Tape helps but knee pain persists and is worse by the end of the day. Working on stretching and strengthening. Gradual progress with ups and downs. Palpable tightness Lt quad and tenderness Lt peripatellar area especially medial joint line. Note decrease in Lt knee extension - improved with manual work and exercises. Encouraged patient to work diligently on full knee extension.    Rehab Potential Good    PT Frequency 2x / week    PT Duration 6 weeks    PT Treatment/Interventions ADLs/Self Care Home Management;Aquatic Therapy;Cryotherapy;Electrical Stimulation;Iontophoresis 74m/ml Dexamethasone;Moist Heat;Ultrasound;Functional mobility training;Therapeutic activities;Therapeutic exercise;Neuromuscular re-education;Patient/family education;Manual techniques;Passive range of motion;Dry needling;Taping;Traction    PT Next Visit Plan L LE strenghtening and muscular endurance, shoulder/cervical strengthening and modalities as needed    PT Home Exercise Plan [email or web address]   Consulted and Agree with Plan of Care Patient             Patient will benefit from skilled therapeutic intervention in order to improve the following deficits and impairments:     Visit Diagnosis: Abnormal posture  Cervicalgia  Acute pain of left knee  Other symptoms and signs involving the musculoskeletal system  Muscle weakness (generalized)  Other abnormalities of gait and mobility     Problem List Patient Active  Problem List   Diagnosis Date Noted   Chronic pain of both knees 02/25/2020   Urinary incontinence 02/19/2019   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

## 2021-02-24 ENCOUNTER — Ambulatory Visit (INDEPENDENT_AMBULATORY_CARE_PROVIDER_SITE_OTHER): Payer: Self-pay | Admitting: Family Medicine

## 2021-02-24 ENCOUNTER — Other Ambulatory Visit (HOSPITAL_COMMUNITY): Payer: Self-pay

## 2021-02-24 ENCOUNTER — Encounter: Payer: Self-pay | Admitting: Family Medicine

## 2021-02-24 VITALS — BP 122/78 | HR 97 | Ht 66.0 in | Wt 215.4 lb

## 2021-02-24 DIAGNOSIS — J45909 Unspecified asthma, uncomplicated: Secondary | ICD-10-CM

## 2021-02-24 DIAGNOSIS — Z0000 Encounter for general adult medical examination without abnormal findings: Secondary | ICD-10-CM

## 2021-02-24 DIAGNOSIS — Z23 Encounter for immunization: Secondary | ICD-10-CM

## 2021-02-24 MED ORDER — BUDESONIDE-FORMOTEROL FUMARATE 80-4.5 MCG/ACT IN AERO
2.0000 | INHALATION_SPRAY | Freq: Two times a day (BID) | RESPIRATORY_TRACT | 3 refills | Status: DC
  Filled 2021-02-24: qty 10.2, 30d supply, fill #0

## 2021-02-24 MED ORDER — ALBUTEROL SULFATE HFA 108 (90 BASE) MCG/ACT IN AERS
2.0000 | INHALATION_SPRAY | RESPIRATORY_TRACT | 1 refills | Status: DC | PRN
  Filled 2021-02-24: qty 18, 17d supply, fill #0
  Filled 2021-07-19: qty 18, 17d supply, fill #1

## 2021-02-24 NOTE — Progress Notes (Addendum)
SUBJECTIVE:   Chief compliant/HPI: annual examination  Lacey Barron is a 65 y.o. who presents today for an annual exam. She has concerns about her asthma/albuterol use frequency and two moles that she thinks have been changing recently. She is currently in physical therapy for a left knee injury.  Asthma The patient is using her albuterol inhaler up 2 times a day 3-4 times a week and is concerned that it is too often (not just for emergencies). She has been doing that since December, which is a change from previous years. She has had asthma for many years, since her 17s. She takes Singulair nightly and uses fluticasone every morning as well.  The patient has environmental allergies and is currently taking Claritin to manage them. The last time she had a severe asthma attack was in May when she had an allergic reaction to a cat and smoking person when she visited a friend's house. She does not wake up at night due to SOB. Her SOB is not significantly worsened by physical exertion at the moment.  Changing Moles The patient has a mole on the left side of her forehead and one on her left upper chest, both of which she thinks may be changing texture for the last few months.  Left knee pain The patient is seeing PT for her left knee. She is taking meloxicam for pain and baclofen (methocarbamol was not helping) for muscle relaxation. She has had 2 injuries to this knee, both falls, one at the beach in June of 2021 and one in February of 2022. The knee is improving but a bit more swollen after recent intense PT.  Preventative The patient eats breakfast and dinner, but no lunch. She reports having some vegetables and fruits occasionally, but not often. She has fast food 2-3 times a week. She goes out to eat on Sundays.  Her exercise is currently limited to PT for her knee and shoulders. She cannot walk far with knee injury and has a limp due to her left knee.  Relevant PMH/PSH:  The patient  has a history of heart disease in the family. Her brother died this year of MI this year. Mother had MI as well.  The patient also has a family history of cancer. Liver cancer in uncle, pancreatic cancer in aunt, colon cancer in father.  The patient never drinks alcohol and never smokes. She has never had alcohol or smoked in the past.  Review of systems form reviewed and notable for upset stomach, joint aches, and slight acid reflux.   OBJECTIVE:   BP 122/78   Pulse 97   Ht 5\' 6"  (1.676 m)   Wt 215 lb 6.4 oz (97.7 kg)   LMP 10/14/2013   SpO2 96%   BMI 34.77 kg/m   General: Well-appearing and conversational adult, sitting comfortably in chair, in no acute distress. Neck: Supple, no LAD. HEENT: PERRLA, no scleral injections or icterus, normal and non-bulging TM, significant dry and packed earwax buildup in canals bilaterally  Cardiovascular: RRR, normal S1/S2, no gallops, rubs, or murmurs. Trace bilateral edema at ankles.  Pulmonary: Lungs clear bilaterally to ascultation. No wheezes, rales, or crackles. Abdominal: Normal active bowel sounds. No organomegaly or masses. No tenderness to superficial or deep palpation. No rebound tenderness or guarding. Extremities: Trace bipedal edema. Bone spur on inside of left thumb. Skin: Multiple nevi throughout body, well defined, uniform in color, and with clear borders, skin is warm and dry  ASSESSMENT/PLAN:  Annual Examination  See AVS for age appropriate recommendations  PHQ score 1, reviewed and discussed. BP reviewed and at goal. Asked about intimate partner violence and no concerns raised. Advance directives discussion not held at this visit.  Considered the following items based upon USPSTF recommendations: Diabetes screening:  not indicated after no findings last year Screening for elevated cholesterol: last done 1 year ago and healthy, not indicated today. HIV testing:  Already done and not indicated today. Hepatitis C: Already  done and not indicated today. Hepatitis B:  Not at high risk and not interested today. Syphilis if at high risk:  Not at high risk and not ordered. GC/CT not at high risk and not ordered. Reviewed risk factors for latent tuberculosis and not indicated  Cervical cancer screening: prior Pap reviewed, repeat due in 02/2025 Breast cancer screening:  up to date, next mammogram due 03/2022 Colorectal cancer screening: up to date on screening for CRC. Last colonoscopy 04/2020 which showed two serrated sessile polyps without cytologic dysplasia  Lung cancer screening: Not ordered, patient is a non-smoker. Vaccinations: COVID vaccines received in 2021, declined booster. Received Tdap today.  Asthma The frequency of inhaler use in the patient is concerning and warrants further evaluation. A lack of wheezing on physical exam is moderately reassuring. Referred patient for scheduling of PFTs to evaluate progression of asthma. Prescribed a maintenance course of Symbicort in the meantime and refilled albuterol inhaler.  Left knee pain Encouraged patient to limit activity and continue physical therapy as instructed.  Moles Upon evaluation, the moles are well defined, uniform, and symmetrical and as such are not concerning for melanoma. Offered patient removal procedure at a later date and she said she would consider.  Preventative Counseled on the benefits of increasing exercise when possible and adding more vegetables to daily diet while limiting salt. Provided Tdap vaccine at this visit. Counseled patient on the benefits of the COVID booster, but the patient declined.  Follow up in 1 year or sooner if indicated.   Dimitry Mining engineer, [place]    I have seen and evaluated this patient and agree with the above as documented in the medical student note. Sharion Settler PGY-2 Family Medicine

## 2021-02-24 NOTE — Patient Instructions (Signed)
It was wonderful to meet you today.  Please bring ALL of your medications with you to every visit.   Today we talked about:  I am starting you on a maintenance medication for your asthma called Symbicort. Take 2 puffs twice daily.  Use your albuterol only as needed. Please call the office to schedule an appointment with Dr. Valentina Lucks for Lung function testing. He will start excepting patients again after August 1.  Today at your annual preventive visit we talked about the following measures: I recommend 150 minutes of exercise per week-try 30 minutes 5 days per week We discussed reducing sugary beverages (like soda and juice) and increasing leafy greens and whole fruits.  We discussed avoiding tobacco and alcohol.  I recommend avoiding illicit substances.  Your blood pressure is 122/78 at goal of <130/80.    Thank you for choosing [facility].   Please call [phone] with any questions about today's appointment.  Please be sure to schedule follow up at the front  desk before you leave today.   Sharion Settler, DO PGY-2 Family Medicine

## 2021-02-28 ENCOUNTER — Ambulatory Visit (INDEPENDENT_AMBULATORY_CARE_PROVIDER_SITE_OTHER): Payer: PRIVATE HEALTH INSURANCE | Admitting: Rehabilitative and Restorative Service Providers"

## 2021-02-28 ENCOUNTER — Other Ambulatory Visit: Payer: Self-pay

## 2021-02-28 ENCOUNTER — Encounter: Payer: Self-pay | Admitting: Rehabilitative and Restorative Service Providers"

## 2021-02-28 DIAGNOSIS — M25562 Pain in left knee: Secondary | ICD-10-CM

## 2021-02-28 DIAGNOSIS — R293 Abnormal posture: Secondary | ICD-10-CM

## 2021-02-28 DIAGNOSIS — M542 Cervicalgia: Secondary | ICD-10-CM

## 2021-02-28 DIAGNOSIS — R29898 Other symptoms and signs involving the musculoskeletal system: Secondary | ICD-10-CM | POA: Diagnosis not present

## 2021-02-28 DIAGNOSIS — M6281 Muscle weakness (generalized): Secondary | ICD-10-CM

## 2021-02-28 NOTE — Therapy (Signed)
[place] [address] [place], [place], [address] Phone: [phone]   Fax:  [phone]  Physical Therapy Treatment  Patient Details  Name: Lacey Barron MRN: [id number] Date of Birth: 30-Jan-1956 Referring Provider (PT): Cline Crock, [place]   Encounter Date: 02/28/2021   PT End of Session - 02/28/21 0802     Visit Number 30    Number of Visits 34    Date for PT Re-Evaluation 03/10/21    Authorization Type Approved 11Therapy sessions (02/08/2021-03/14/2021)  *The patient in notes has been approved initially for eval + 16, then 8, now an additional 11 viisits.    Authorization - Visit Number 7    Authorization - Number of Visits 11    PT Start Time 0759    PT Stop Time 0849    PT Time Calculation (min) 50 min    Activity Tolerance Patient tolerated treatment well             Past Medical History:  Diagnosis Date   Chronic knee pain    bilateral    Complication of anesthesia    hard to wake   GERD (gastroesophageal reflux disease)    History of abnormal cervical Pap smear 2019   Hypertension    followed by pcp   IDA (iron deficiency anemia)    Mild intermittent asthma    followed by pcp   OA (osteoarthritis)    shoulders and knees   PONV (postoperative nausea and vomiting)    Sickle cell trait (HCC)    SUI (stress urinary incontinence, female)    Uterine fibroid    Wears glasses    Wears partial dentures    upper and lower    Past Surgical History:  Procedure Laterality Date   COLONOSCOPY  per pt scheduled 05-06-2020   DILATATION & CURETTAGE/HYSTEROSCOPY WITH MYOSURE N/A 05/09/2020   Procedure: DILATATION & CURETTAGE/HYSTEROSCOPY WITH MYOSURE  RESECTION OF ENDOMETRIAL MASS;  Surgeon: Megan Salon, MD;  Location: [facility];  Service: Gynecology;  Laterality: N/A;  polyp resection   TUBAL LIGATION Bilateral 1980s    There were no vitals filed for this visit.    Subjective Assessment - 02/28/21 0802     Subjective Patient reports that her knee is better than last week - still tight and painful at times. Neck is better with stretching, TENS and hot pack. Has already taped the knee. She has bee stretching knee again.    Currently in Pain? No/denies    Pain Score 0-No pain    Pain Location Neck    Pain Score 2    Pain Location Knee    Pain Orientation Left    Pain Descriptors / Indicators Tender;Tightness    Pain Type Chronic pain                [facility] PT Assessment - 02/28/21 0001       Assessment   Medical Diagnosis Cervicalgia; Lt knee pain    Referring Provider (PT) Cline Crock, PA-C    Onset Date/Surgical Date 09/29/20    Hand Dominance Right    Next MD Visit 03/14/21    Prior Therapy none      AROM   Left Knee Extension -2    Left Knee Flexion 93                           [facility] Adult PT Treatment/Exercise - 02/28/21  0001       Neck Exercises: Seated   Neck Retraction 5 reps;5 secs    Cervical Rotation Right;Left;5 reps    Lateral Flexion Right;Left;5 reps      Knee/Hip Exercises: Stretches   Gastroc Stretch Both;2 reps;30 seconds   slant board     Knee/Hip Exercises: Aerobic   Nustep L6 x 6 min LEs and UEs 10      Knee/Hip Exercises: Standing   Terminal Knee Extension Strengthening;Left;10 reps;Theraband   5-10 sec hold   Theraband Level (Terminal Knee Extension) Level 3 (Green)    Lateral Step Up Left;10 reps;Hand Hold: 1;Step Height: 6"    Lateral Step Up Limitations repeated with heel tap 4 inch step    Forward Step Up Left;10 reps;Hand Hold: 2;Step Height: 6"    Step Down Right;10 reps;Step Height: 4"    Functional Squat 10 reps    SLS 20 sec x 4 reps UE support as needed      Shoulder Exercises: ROM/Strengthening   UBE (Upper Arm Bike) L5 x 4 min alt fwd/bkwd      Shoulder Exercises: Stretch   Wall Stretch - Flexion 3 reps;20 seconds   stepping under dowel in doorway to stretch  into shoulder flexion   Other Shoulder Stretches 3 position doorway stretch x 30 sec.    Other Shoulder Stretches at wall shoulder clock 3 pm to noon x 10; T for stretch through the pecs 20-30 sec x 3 rep each side      Moist Heat Therapy   Number Minutes Moist Heat 15 Minutes    Moist Heat Location Cervical;Knee   during traction     Cryotherapy   Number Minutes Cryotherapy 15 Minutes    Cryotherapy Location Knee   Lt anterior knee   Type of Cryotherapy Ice pack      Traction   Type of Traction Cervical    Min (lbs) 22    Max (lbs) 28    Hold Time 60    Rest Time 20    Time 15                         PT Long Term Goals - 02/10/21 1056       PT LONG TERM GOAL #1   Title Improve posture and alignment with patient to demonstrate improved upright posture of head and neck with posterior shoulder girdle engaged    Time 6    Period Weeks    Status Partially Met      PT LONG TERM GOAL #2   Title Increased cervical extension; lateral flexion; rotation by 10-15 degrees or more in all planes    Time 6    Period Weeks    Status Partially Met      PT LONG TERM GOAL #3   Title Decrease pain with functional activities and sleeping by 50-75% allowing patient to live more normally    Time 6    Period Weeks    Status Achieved      PT LONG TERM GOAL #4   Title Independent in HEP including aquatic exercise as appropriate    Time 6    Period Weeks    Status Partially Met      PT LONG TERM GOAL #5   Title Improve functional limitation score to 51    Time 6    Period Weeks    Status Achieved      PT LONG TERM  GOAL #6   Title Increase AROM Lt knee to 100 deg flexion and 0 deg extension    Baseline 3-107 deg - 02/10/21    Time 6    Period Weeks    Status Partially Met      PT LONG TERM GOAL #7   Title Increase strength Lt hip to 5/5    Baseline Patient has 4+/5 Left hip fleixon, 5/5 hip abduction and extension.    Time 6    Period Weeks    Status Partially  Met      PT LONG TERM GOAL #8   Title Normal gait including step over step for stairs    Baseline Able to complete without difficulty - 01/27/21    Time 6    Status Achieved                   Plan - 02/28/21 0804     Clinical Impression Statement Neck and knee are improving with stretching and exercising consistently. Patient continues to tape the Lt knee with decrease in symptoms noted with taping. Ambulating with decreased limp and improved gait pattern. Good response to cervical stretching and postural strengthening with cervical traction in clinic and at home.Gradually progressing toward therapy goals.    Rehab Potential Good    PT Frequency 2x / week    PT Duration 6 weeks    PT Treatment/Interventions ADLs/Self Care Home Management;Aquatic Therapy;Cryotherapy;Electrical Stimulation;Iontophoresis 46m/ml Dexamethasone;Moist Heat;Ultrasound;Functional mobility training;Therapeutic activities;Therapeutic exercise;Neuromuscular re-education;Patient/family education;Manual techniques;Passive range of motion;Dry needling;Taping;Traction    PT Next Visit Plan L LE strenghtening and muscular endurance, shoulder/cervical strengthening and modalities as needed    PT Home Exercise Plan [email or web address]   Consulted and Agree with Plan of Care Patient             Patient will benefit from skilled therapeutic intervention in order to improve the following deficits and impairments:     Visit Diagnosis: Abnormal posture  Cervicalgia  Acute pain of left knee  Other symptoms and signs involving the musculoskeletal system  Muscle weakness (generalized)     Problem List Patient Active Problem List   Diagnosis Date Noted   Chronic pain of both knees 02/25/2020   Urinary incontinence 02/19/2019   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

## 2021-03-02 ENCOUNTER — Other Ambulatory Visit: Payer: Self-pay

## 2021-03-02 ENCOUNTER — Ambulatory Visit (INDEPENDENT_AMBULATORY_CARE_PROVIDER_SITE_OTHER): Payer: PRIVATE HEALTH INSURANCE | Admitting: Rehabilitative and Restorative Service Providers"

## 2021-03-02 ENCOUNTER — Encounter: Payer: Self-pay | Admitting: Rehabilitative and Restorative Service Providers"

## 2021-03-02 DIAGNOSIS — R29898 Other symptoms and signs involving the musculoskeletal system: Secondary | ICD-10-CM | POA: Diagnosis not present

## 2021-03-02 DIAGNOSIS — M6281 Muscle weakness (generalized): Secondary | ICD-10-CM

## 2021-03-02 DIAGNOSIS — R293 Abnormal posture: Secondary | ICD-10-CM | POA: Diagnosis not present

## 2021-03-02 DIAGNOSIS — M25562 Pain in left knee: Secondary | ICD-10-CM | POA: Diagnosis not present

## 2021-03-02 DIAGNOSIS — M542 Cervicalgia: Secondary | ICD-10-CM | POA: Diagnosis not present

## 2021-03-02 DIAGNOSIS — R2689 Other abnormalities of gait and mobility: Secondary | ICD-10-CM

## 2021-03-02 NOTE — Patient Instructions (Signed)
Access Code: [email or web address]: 07/14/2022Prepared by: Ionia Schey HoltExercises  Upper Cervical Extension SNAG with Strap - 2 x daily - 7 x weekly - 1 sets - 5 reps - 5-10 sec hold  Seated Cervical Sidebending AROM - 2 x daily - 7 x weekly - 1 sets - 5 reps - 5-10 sec hold  Seated Cervical Rotation AROM - 2 x daily - 7 x weekly - 1 sets - 5 reps - 2-3 sec hold  Seated Shoulder W - 4-5 x daily - 7 x weekly - 1 sets - 5-10 reps - 5 sec hold  Standing Backward Shoulder Rolls - 2 x daily - 7 x weekly - 1 sets - 10 reps - 1-2 sec hold  Doorway Pec Stretch at 60 Degrees Abduction - 3 x daily - 7 x weekly - 3 reps - 1 sets  Doorway Pec Stretch at 120 Degrees Abduction - 3 x daily - 7 x weekly - 3 reps - 1 sets - 30 second hold hold  Bilateral Scapular Depression with Anchored Resistance - Straight Arm - 2 x daily - 7 x weekly - 1 sets - 10 reps - 5 sec hold  Standing Shoulder Horizontal Abduction with Resistance - 2 x daily - 7 x weekly - 1 sets - 10 reps  Standing Bilateral Low Shoulder Row with Anchored Resistance - 2 x daily - 7 x weekly - 1-3 sets - 10 reps - 2-3 sec hold  Seated Thoracic Extension and Rotation with Reach - 1 x daily - 7 x weekly - 3 sets - 10 reps  Sit to Stand - 2 x daily - 7 x weekly - 1 sets - 10 reps  Seated Hamstring Stretch - 2 x daily - 7 x weekly - 1 sets - 3 reps - 30 sec hold  Single Leg Stance - 2 x daily - 7 x weekly - 2 sets - 5 reps - 20 sec hold  Gastroc Stretch on Wall - 2 x daily - 7 x weekly - 1 sets - 3 reps - 30 sec hold  Step Up - 2 x daily - 7 x weekly - 1 sets - 10 reps - 2 sec hold  Lateral Step Up - 2 x daily - 7 x weekly - 2 sets - 10 reps - 2 sec hold  Standing Terminal Knee Extension with Resistance - 2 x daily - 7 x weekly - 2-3 sets - 10 reps - 3-5 sec hold  Supine ITB Stretch with Strap - 2 x daily - 7 x weekly - 1 sets - 3 reps - 30 sec hold  Supine Piriformis Stretch with Leg Straight - 2 x daily - 7 x weekly - 1 sets -  3 reps - 30 sec hold

## 2021-03-02 NOTE — Therapy (Addendum)
[place], [place], [address] Phone: [phone]   Fax:  [phone]  Physical Therapy Treatment  Patient Details  Name: Lacey Barron MRN: [id number] Date of Birth: 1955-12-24 Referring Provider (PT): Cline Crock, [place]   Encounter Date: 03/02/2021   PT End of Session - 03/02/21 0808     Visit Number 31    Number of Visits 34    Date for PT Re-Evaluation 03/10/21    Authorization Type Approved 11Therapy sessions (02/08/2021-03/14/2021)  *The patient in notes has been approved initially for eval + 16, then 8, now an additional 11 viisits.    Authorization - Visit Number 8    Authorization - Number of Visits 11    PT Start Time 6962    PT Stop Time [phone]    PT Time Calculation (min) 53 min    Activity Tolerance Patient tolerated treatment well             Past Medical History:  Diagnosis Date   Chronic knee pain    bilateral    Complication of anesthesia    hard to wake   GERD (gastroesophageal reflux disease)    History of abnormal cervical Pap smear 2019   Hypertension    followed by pcp   IDA (iron deficiency anemia)    Mild intermittent asthma    followed by pcp   OA (osteoarthritis)    shoulders and knees   PONV (postoperative nausea and vomiting)    Sickle cell trait (HCC)    SUI (stress urinary incontinence, female)    Uterine fibroid    Wears glasses    Wears partial dentures    upper and lower    Past Surgical History:  Procedure Laterality Date   COLONOSCOPY  per pt scheduled 05-06-2020   DILATATION & CURETTAGE/HYSTEROSCOPY WITH MYOSURE N/A 05/09/2020   Procedure: DILATATION & CURETTAGE/HYSTEROSCOPY WITH MYOSURE  RESECTION OF ENDOMETRIAL MASS;  Surgeon: Megan Salon, MD;  Location: [facility];  Service: Gynecology;  Laterality: N/A;  polyp resection   TUBAL LIGATION Bilateral 1980s    There were no vitals filed for this visit.    Subjective Assessment - 03/02/21 0810     Subjective Machine is her friend(cervical traction). Still some pain in the neck and Rt shoulder and Lt knee. Working on the exercises and using TENS and cervical traction.    Currently in Pain? No/denies    Pain Score 0-No pain    Pain Location Neck    Pain Orientation Right    Pain Score 2    Pain Location Knee    Pain Orientation Left    Pain Descriptors / Indicators Tender;Tightness    Pain Type Chronic pain                               [facility] Adult PT Treatment/Exercise - 03/02/21 0001       Neck Exercises: Seated   Neck Retraction 5 reps;5 secs    Cervical Rotation Right;Left;5 reps    Lateral Flexion Right;Left;5 reps      Knee/Hip Exercises: Stretches   Passive Hamstring Stretch Left;2 reps;30 seconds    ITB Stretch Left;2 reps;30 seconds    Piriformis Stretch Left;2 reps;30 seconds    Gastroc Stretch Both;2 reps;30 seconds   slant board   Other Knee/Hip Stretches standing Lt hip adductor stretch leaning on counter, with Rt side  lunge x 30 sec x 2 (irritated neck in leaned forward position)      Knee/Hip Exercises: Aerobic   Nustep L6 x 6 min LEs and UEs 10      Knee/Hip Exercises: Standing   Lateral Step Up Left;10 reps;Hand Hold: 1;Step Height: 6"    Lateral Step Up Limitations repeated with heel tap 4 inch step    Forward Step Up Left;10 reps;Hand Hold: 2;Step Height: 6"    Step Down Right;10 reps;Step Height: 4"    Step Down Limitations retro step up with Lt LE x 10    Walking with Sports Cord fwd/each side/back x 5 each - 10 sec hold      Shoulder Exercises: Standing   Extension Strengthening;Both;15 reps;Theraband    Theraband Level (Shoulder Extension) Level 4 (Blue)    Row Strengthening;Both;20 reps;Theraband    Theraband Level (Shoulder Row) Level 4 (Blue)    Retraction Strengthening;Both;10 reps    Theraband Level (Shoulder Retraction) Level 3 (Green)      Shoulder Exercises:  ROM/Strengthening   UBE (Upper Arm Bike) L5 x 4 min alt fwd/bkwd      Shoulder Exercises: Stretch   Other Shoulder Stretches 3 position doorway stretch x 30 sec.      Moist Heat Therapy   Number Minutes Moist Heat 15 Minutes    Moist Heat Location Cervical;Knee   during traction     Cryotherapy   Number Minutes Cryotherapy 15 Minutes    Cryotherapy Location Knee   Lt anterior knee   Type of Cryotherapy Ice pack      Traction   Type of Traction Cervical    Min (lbs) 22    Max (lbs) 28    Hold Time 60    Rest Time 20    Time 15                    PT Education - 03/02/21 0826     Education Details HEP    Person(s) Educated Patient    Methods Explanation;Demonstration;Tactile cues;Verbal cues;Handout    Comprehension Verbalized understanding;Returned demonstration;Verbal cues required;Tactile cues required                 PT Long Term Goals - 02/10/21 1056       PT LONG TERM GOAL #1   Title Improve posture and alignment with patient to demonstrate improved upright posture of head and neck with posterior shoulder girdle engaged    Time 6    Period Weeks    Status Partially Met      PT LONG TERM GOAL #2   Title Increased cervical extension; lateral flexion; rotation by 10-15 degrees or more in all planes    Time 6    Period Weeks    Status Partially Met      PT LONG TERM GOAL #3   Title Decrease pain with functional activities and sleeping by 50-75% allowing patient to live more normally    Time 6    Period Weeks    Status Achieved      PT LONG TERM GOAL #4   Title Independent in HEP including aquatic exercise as appropriate    Time 6    Period Weeks    Status Partially Met      PT LONG TERM GOAL #5   Title Improve functional limitation score to 51    Time 6    Period Weeks    Status Achieved      PT  LONG TERM GOAL #6   Title Increase AROM Lt knee to 100 deg flexion and 0 deg extension    Baseline 3-107 deg - 02/10/21    Time 6     Period Weeks    Status Partially Met      PT LONG TERM GOAL #7   Title Increase strength Lt hip to 5/5    Baseline Patient has 4+/5 Left hip fleixon, 5/5 hip abduction and extension.    Time 6    Period Weeks    Status Partially Met      PT LONG TERM GOAL #8   Title Normal gait including step over step for stairs    Baseline Able to complete without difficulty - 01/27/21    Time 6    Status Achieved                   Plan - 03/02/21 2458     Clinical Impression Statement Continued intermittent symptoms in Rt neck/shoulder and Lt knee. Taping helps knee pain and cervical traction helps the neck and shoulder. Patient continues to use TENS unit for pain management as needed. She has returned to all normal functional activities. Gradually progressing toward goals of therapy.    Rehab Potential Good    PT Frequency 2x / week    PT Duration 6 weeks    PT Treatment/Interventions ADLs/Self Care Home Management;Aquatic Therapy;Cryotherapy;Electrical Stimulation;Iontophoresis 72m/ml Dexamethasone;Moist Heat;Ultrasound;Functional mobility training;Therapeutic activities;Therapeutic exercise;Neuromuscular re-education;Patient/family education;Manual techniques;Passive range of motion;Dry needling;Taping;Traction    PT Next Visit Plan L LE strenghtening and muscular endurance, shoulder/cervical strengthening and modalities as needed    PT Home Exercise Plan [email or web address]   Consulted and Agree with Plan of Care Patient             Patient will benefit from skilled therapeutic intervention in order to improve the following deficits and impairments:     Visit Diagnosis: Abnormal posture  Cervicalgia  Acute pain of left knee  Other symptoms and signs involving the musculoskeletal system  Muscle weakness (generalized)  Other abnormalities of gait and mobility     Problem List Patient Active Problem List   Diagnosis Date Noted   Chronic pain of both knees 02/25/2020    Urinary incontinence 02/19/2019   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   Allergic rhinitis 11/10/2015   Asthma 11/10/2015   CLAUDICATION, INTERMITTENT 06/12/2010   ANEMIA-IRON DEFICIENCY 07/20/2008   Obesity 07/19/2008   HYPERTENSION, BENIGN, MILD 07/19/2008    Reeda Soohoo PNilda SimmerPT, MPH  03/02/2021, 8:43 AM  [facility] [address]Phone: [phone]  Fax:  [phone] Name: Lacey GENSONMRN: [id number] of Birth: 102-14-1957 PHYSICAL THERAPY DISCHARGE SUMMARY  Visits from Start of Care: 31  Current functional level related to goals / functional outcomes: See last progress note for discharge status    Remaining deficits: Continued intermittent discomfort and tightness    Education / Equipment: HEP - encouraged to continue with independent HEP   Patient agrees to discharge. Patient goals were partially met. Patient is being discharged due to not returning since the last visit.  Laiken Nohr P. HHelene KelpPT, MPH 03/27/21 9:22 AM

## 2021-03-06 ENCOUNTER — Encounter: Payer: 59 | Admitting: Rehabilitative and Restorative Service Providers"

## 2021-03-09 ENCOUNTER — Encounter: Payer: 59 | Admitting: Rehabilitative and Restorative Service Providers"

## 2021-03-13 ENCOUNTER — Encounter: Payer: 59 | Admitting: Rehabilitative and Restorative Service Providers"

## 2021-03-24 ENCOUNTER — Ambulatory Visit (INDEPENDENT_AMBULATORY_CARE_PROVIDER_SITE_OTHER): Payer: Self-pay | Admitting: Pharmacist

## 2021-03-24 ENCOUNTER — Other Ambulatory Visit: Payer: Self-pay

## 2021-03-24 ENCOUNTER — Other Ambulatory Visit (HOSPITAL_COMMUNITY): Payer: Self-pay

## 2021-03-24 ENCOUNTER — Encounter: Payer: Self-pay | Admitting: Pharmacist

## 2021-03-24 DIAGNOSIS — J45909 Unspecified asthma, uncomplicated: Secondary | ICD-10-CM

## 2021-03-24 DIAGNOSIS — I10 Essential (primary) hypertension: Secondary | ICD-10-CM

## 2021-03-24 DIAGNOSIS — J309 Allergic rhinitis, unspecified: Secondary | ICD-10-CM

## 2021-03-24 MED ORDER — MONTELUKAST SODIUM 10 MG PO TABS
10.0000 mg | ORAL_TABLET | Freq: Every day | ORAL | 2 refills | Status: DC
  Filled 2021-03-24: qty 90, 90d supply, fill #0
  Filled 2021-07-19: qty 90, 90d supply, fill #1
  Filled 2021-11-28: qty 90, 90d supply, fill #2

## 2021-03-24 MED ORDER — BUDESONIDE-FORMOTEROL FUMARATE 80-4.5 MCG/ACT IN AERO
2.0000 | INHALATION_SPRAY | Freq: Two times a day (BID) | RESPIRATORY_TRACT | 3 refills | Status: DC
  Filled 2021-03-24: qty 10.2, 30d supply, fill #0
  Filled 2021-07-19: qty 10.2, 30d supply, fill #1
  Filled 2021-11-28: qty 10.2, 30d supply, fill #2

## 2021-03-24 MED ORDER — AMLODIPINE BESYLATE 5 MG PO TABS
5.0000 mg | ORAL_TABLET | Freq: Every day | ORAL | 2 refills | Status: DC
  Filled 2021-03-24: qty 90, 90d supply, fill #0
  Filled 2021-07-19: qty 90, 90d supply, fill #1
  Filled 2021-11-28: qty 90, 90d supply, fill #2

## 2021-03-24 MED ORDER — LISINOPRIL-HYDROCHLOROTHIAZIDE 20-25 MG PO TABS
1.0000 | ORAL_TABLET | Freq: Every day | ORAL | 2 refills | Status: DC
  Filled 2021-03-24: qty 90, 90d supply, fill #0
  Filled 2021-07-19: qty 90, 90d supply, fill #1
  Filled 2021-11-28: qty 90, 90d supply, fill #2

## 2021-03-24 MED ORDER — FLUTICASONE PROPIONATE 50 MCG/ACT NA SUSP
2.0000 | Freq: Every day | NASAL | 2 refills | Status: DC
  Filled 2021-03-24: qty 16, 30d supply, fill #0
  Filled 2021-07-19: qty 16, 30d supply, fill #1
  Filled 2021-11-28: qty 16, 30d supply, fill #2

## 2021-03-24 NOTE — Assessment & Plan Note (Signed)
Patient has been experiencing asthma symptoms since she was ~65 YO. Patient reports being adherent to all medications.  Spirometry evaluation with pre- and post-bronchodilator reveals near normal lung function.   -continue Symbicort 2 puffs BID and take as needed for symptomatic control.  Goal is to no longer use albuterol PRN.  -Educated patient on purpose, proper use, potential adverse effects including risk of esophageal candidiasis and need to rinse mouth after each use.  -Reviewed results of pulmonary function tests.  -Patient reviewed and educated on purpose, proper use and potential adverse effects of Symbicort inhaler.  Following instruction patient demonstrated improved inhalation technique.

## 2021-03-24 NOTE — Progress Notes (Signed)
   S:    Patient arrives in good spirits and does not need assistance ambulating. Presents for lung function evaluation.   Patient was referred and last seen by Primary Care Provider Dr. Nita Sells on 02/24/2021.   Patient reports breathing has improved since starting Symbicort at her last PCP visit on 02/24/2021. She states that she no longer requires use of albuterol rescue inhaler 2-3 times/day.  Only two uses of albuterol since last visit.   Patient reports adherence to all medications Patient reports last dose of asthma medications (Symbicort) was last night at ~9:30 PM Current asthma medications: albuterol PRN, Symbicort 80-4.5, montelukast   O:  Review of Systems  Respiratory: Negative.    All other systems reviewed and are negative.  Vitals:   03/24/21 0909  BP: 132/76  Pulse: 92  SpO2: 98%    See "scanned report" or Documentation Flowsheet (discrete results - PFTs) for  Spirometry results. Patient provided good effort while attempting spirometry.   Lung Age = 74 Pre FVC % predicted: 95.7, % change = -9.6 Pre FEV1 % predicted: 87.5, % change = 3.7 Albuterol Neb  Lot# [id number]     Exp. 04/19/2022   A/P: Patient has been experiencing asthma symptoms since she was ~65 YO. Patient reports being adherent to all medications.  Spirometry evaluation with pre- and post-bronchodilator reveals near normal lung function.   -continue Symbicort 2 puffs BID and take as needed for symptomatic control.   Goal is to no longer use albuterol PRN.  -Educated patient on purpose, proper use, potential adverse effects including risk of esophageal candidiasis and need to rinse mouth after each use.  -Reviewed results of pulmonary function tests.  -Patient reviewed and educated on purpose, proper use and potential adverse effects of Symbicort inhaler.  Following instruction patient demonstrated improved inhalation technique.   Patient verbalized understanding of results and education.  Written pt  instructions provided.   F/U PCP Clinic visit in 2-3 months.  If patient continues to require use of rescue therapy at that visit, please consider change to higher dose of Symbicort inhaler (2 puffs BID)   Total time in face to face counseling 45 minutes.  Patient seen with Meyer Russel, PharmD Candidate, and Joseph Art, PharmD - PGY-1 Resident.  Marland Kitchen

## 2021-03-24 NOTE — Patient Instructions (Addendum)
It was a pleasure seeing you in clinic today!  Medication Changes: Continue Symbicort 2 puffs twice daily and as needed for symptomatic control.   Follow up with Dr. Nita Sells in 2-3 months.

## 2021-03-27 NOTE — Progress Notes (Signed)
Reviewed: I agree with Dr. Koval's documentation and management. 

## 2021-03-29 ENCOUNTER — Other Ambulatory Visit: Payer: Self-pay | Admitting: Family Medicine

## 2021-03-29 DIAGNOSIS — Z1231 Encounter for screening mammogram for malignant neoplasm of breast: Secondary | ICD-10-CM

## 2021-04-05 ENCOUNTER — Other Ambulatory Visit: Payer: Self-pay

## 2021-04-05 ENCOUNTER — Ambulatory Visit
Admission: RE | Admit: 2021-04-05 | Discharge: 2021-04-05 | Disposition: A | Discharge status: Home / Self Care | Payer: 59 | Source: Ambulatory Visit

## 2021-04-05 DIAGNOSIS — Z1231 Encounter for screening mammogram for malignant neoplasm of breast: Secondary | ICD-10-CM

## 2021-04-11 ENCOUNTER — Encounter: Payer: Self-pay | Admitting: Family Medicine

## 2021-05-19 DIAGNOSIS — H2513 Age-related nuclear cataract, bilateral: Secondary | ICD-10-CM | POA: Diagnosis not present

## 2021-05-19 DIAGNOSIS — H524 Presbyopia: Secondary | ICD-10-CM | POA: Diagnosis not present

## 2021-07-19 ENCOUNTER — Other Ambulatory Visit (HOSPITAL_COMMUNITY): Payer: Self-pay

## 2021-11-28 ENCOUNTER — Other Ambulatory Visit (HOSPITAL_COMMUNITY): Payer: Self-pay

## 2021-11-28 ENCOUNTER — Other Ambulatory Visit: Payer: Self-pay | Admitting: Family Medicine

## 2021-11-28 DIAGNOSIS — J45909 Unspecified asthma, uncomplicated: Secondary | ICD-10-CM

## 2021-11-28 MED ORDER — ALBUTEROL SULFATE HFA 108 (90 BASE) MCG/ACT IN AERS
2.0000 | INHALATION_SPRAY | RESPIRATORY_TRACT | 1 refills | Status: AC | PRN
  Filled 2021-11-28: qty 18, 17d supply, fill #0
  Filled 2022-04-03: qty 18, 17d supply, fill #1

## 2021-12-14 IMAGING — MR MR CERVICAL SPINE W/O CM
4 of 5 series · 19 of 48 positions shown · non-contrast
Comparison: X-ray 10/14/2013

CLINICAL DATA: Neck pain with right-sided radiculopathy

EXAM:
MRI CERVICAL SPINE WITHOUT CONTRAST
TECHNIQUE: Multiplanar, multisequence MR imaging of the cervical spine was
performed. No intravenous contrast was administered.

[Series 2: T2 · sagittal · 3.0mm · 0.43mm/px · 6 of 16 slices shown (1 of 2)]
[im 1/16]
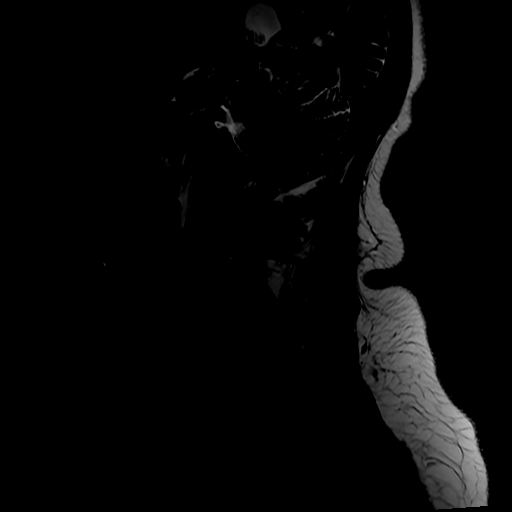
[im 4/16]
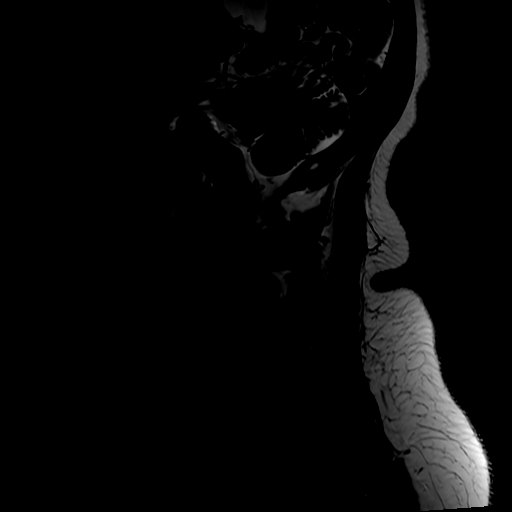
[im 7/16]
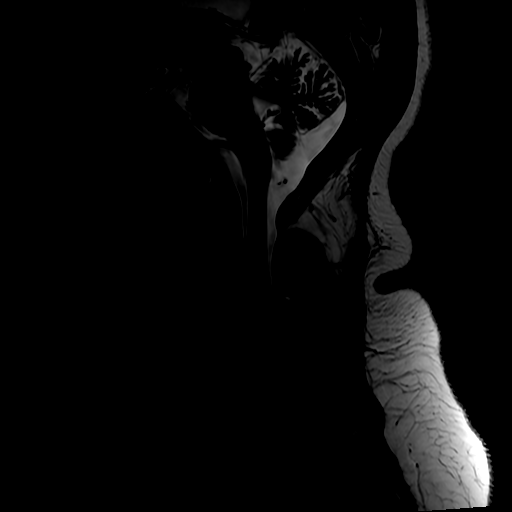
[im 10/16]
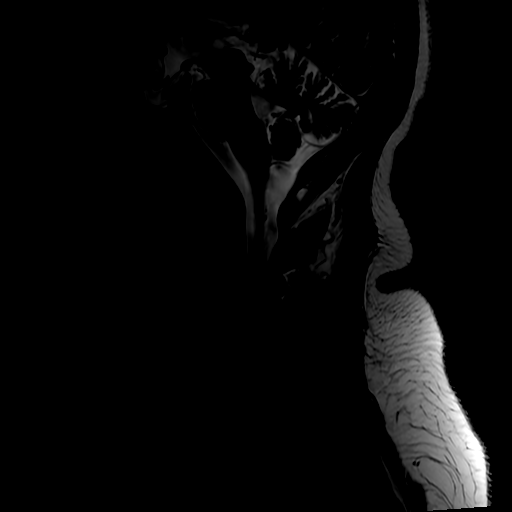
[im 13/16]
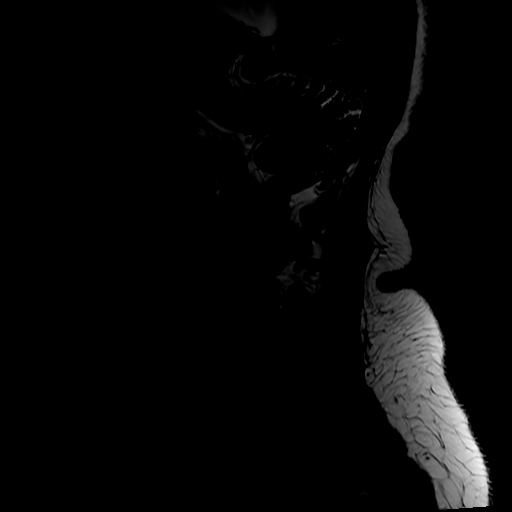
[im 16/16]
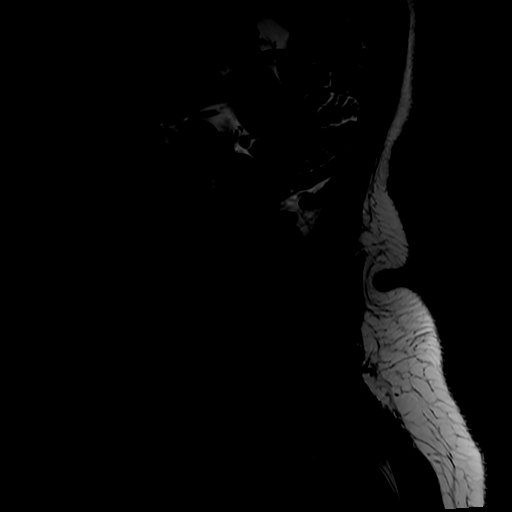

[Series 3: STIR · sagittal · 3.0mm · 0.43mm/px · 3 of 16 slices shown]
[im 1/16]
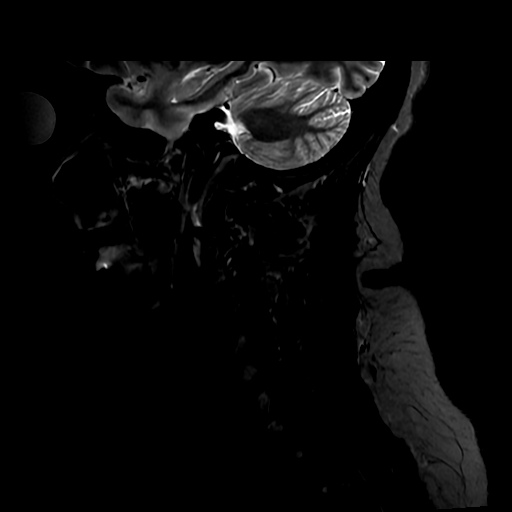
[im 8/16]
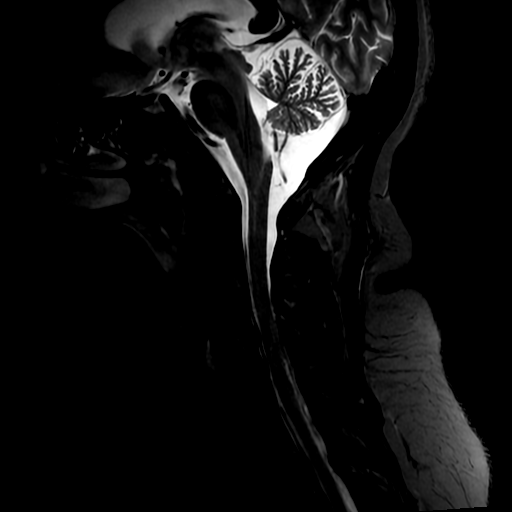
[im 16/16]
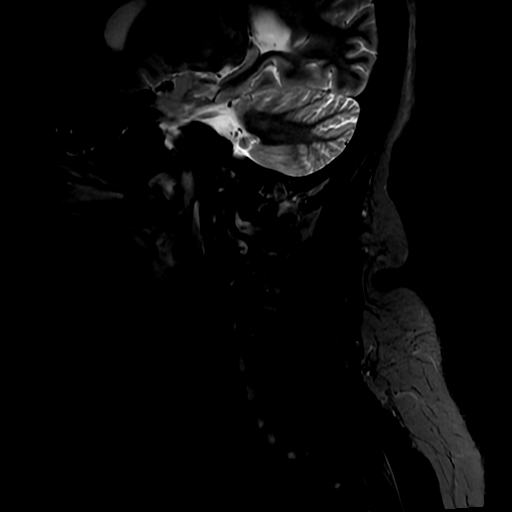

[Series 4: FLAIR · sagittal · 3.0mm · 0.43mm/px · 3 of 16 slices shown]
[im 1/16]
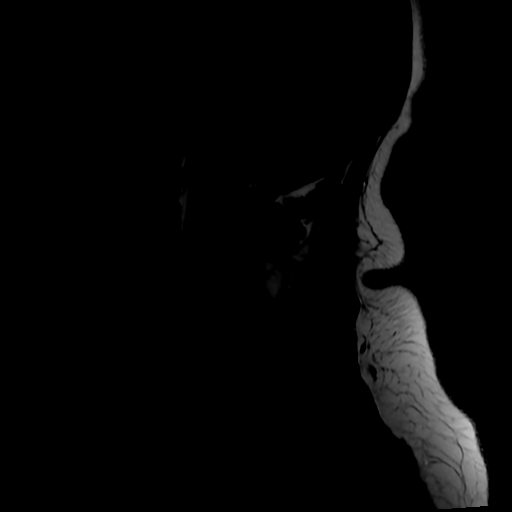
[im 8/16]
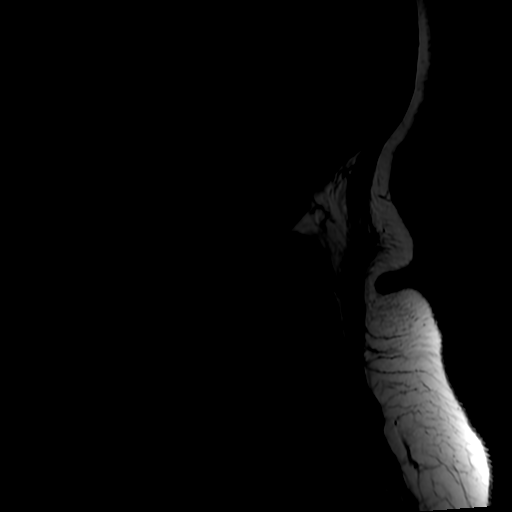
[im 16/16]
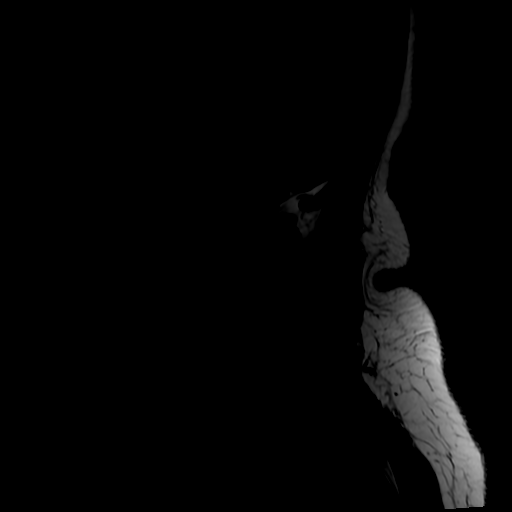

[Series 8: T2 · axial · 3.0mm · 0.35mm/px · z∈[-106,-39]mm · 7 of 26 slices shown (2 of 2)]
[im 1/26]
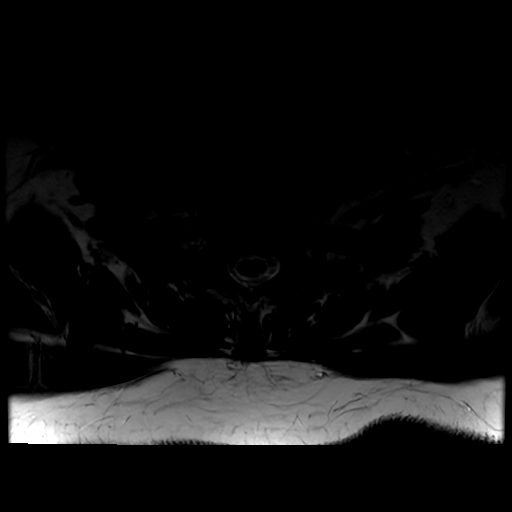
[im 4/26]
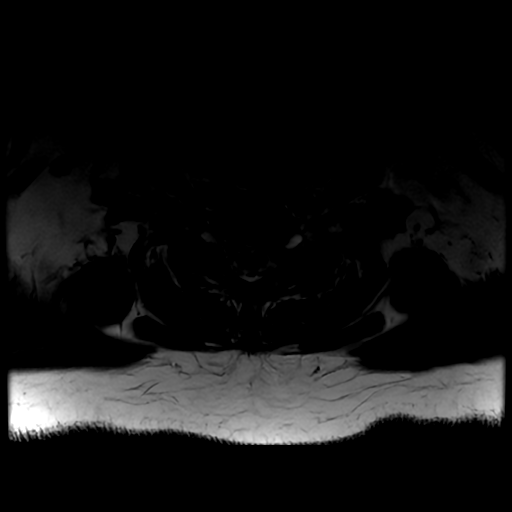
[im 8/26]
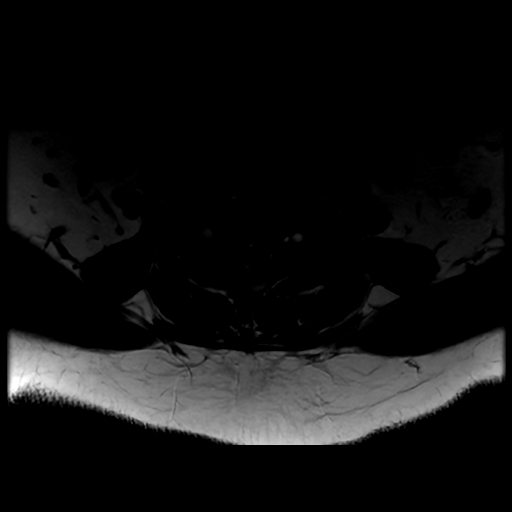
[im 11/26]
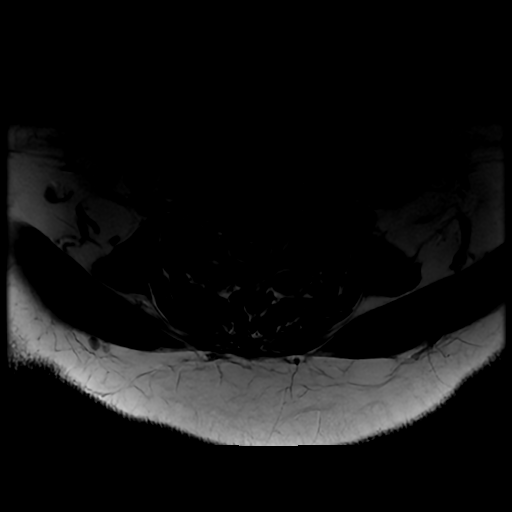
[im 15/26]
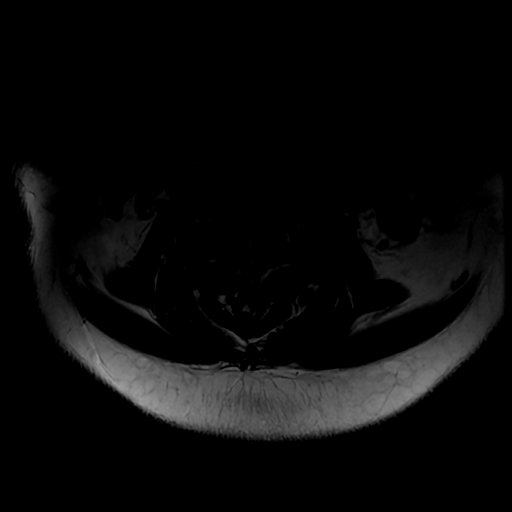
[im 18/26]
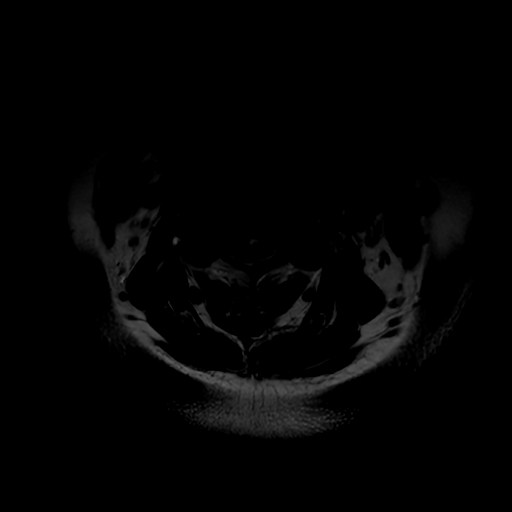
[im 22/26]
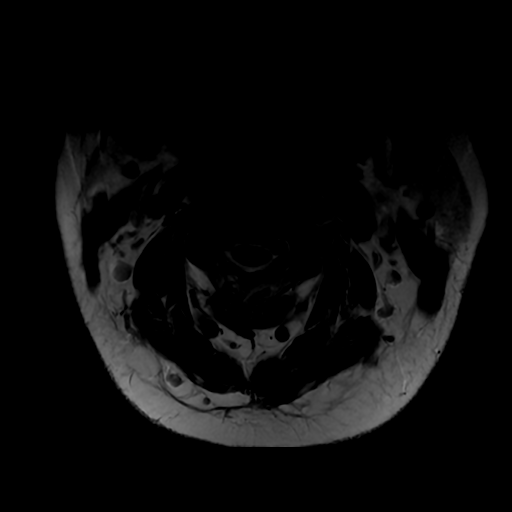

[19 of 48 positions shown; findings below may reference images not displayed]

FINDINGS: Alignment: Physiologic.

Vertebrae: No fracture. No evidence of discitis. Mild diffuse
intrinsic canal narrowing on the basis of congenitally short
pedicles. Diffuse marrow heterogeneity without discrete marrow
replacing lesion. Findings are nonspecific but can be seen in the
setting of chronic anemia, smoking, and/or obesity.

Cord: Normal signal and morphology.

Posterior Fossa, vertebral arteries, paraspinal tissues: No acute
findings. Incidental 9 mm left thyroid lobe nodule. Not clinically
significant; no follow-up imaging recommended (ref: [HOSPITAL]. [DATE]): 143-50).

Disc levels:

C2-C3: No significant disc protrusion, foraminal stenosis, or canal
stenosis.

C3-C4: Shallow left paracentral disc protrusion. No foraminal or
canal stenosis.

C4-C5: Small posterior disc osteophyte complex contacts the ventral
cord resulting in mild canal stenosis. Bilateral foramina are
patent.

C5-C6: Small disc osteophyte complex contacting the ventral cord
resulting in mild canal stenosis. Mild right uncovertebral spurring
contributes to mild right foraminal stenosis.

C6-C7: No significant disc protrusion, foraminal stenosis, or canal
stenosis.

C7-T1: No significant disc protrusion, foraminal stenosis, or canal
stenosis.
IMPRESSION: 1. Mild cervical degenerative disc disease superimposed on a
congenitally narrowed canal resulting in mild canal stenosis at C4-5
and C5-6.
2. Mild right foraminal stenosis at C5-6.
3. Diffuse marrow heterogeneity without discrete marrow replacing
lesion. Findings are nonspecific but can be seen in the setting of
chronic anemia, smoking, and/or obesity.

## 2022-01-23 ENCOUNTER — Encounter: Payer: Self-pay | Admitting: *Deleted

## 2022-02-28 ENCOUNTER — Encounter: Payer: Self-pay | Admitting: Family Medicine

## 2022-02-28 ENCOUNTER — Other Ambulatory Visit (HOSPITAL_COMMUNITY): Payer: Self-pay

## 2022-02-28 ENCOUNTER — Ambulatory Visit (INDEPENDENT_AMBULATORY_CARE_PROVIDER_SITE_OTHER): Payer: 59 | Admitting: Family Medicine

## 2022-02-28 VITALS — BP 112/72 | HR 99 | Wt 211.0 lb

## 2022-02-28 DIAGNOSIS — Z6835 Body mass index (BMI) 35.0-35.9, adult: Secondary | ICD-10-CM | POA: Diagnosis not present

## 2022-02-28 DIAGNOSIS — M25511 Pain in right shoulder: Secondary | ICD-10-CM | POA: Diagnosis not present

## 2022-02-28 DIAGNOSIS — Z0000 Encounter for general adult medical examination without abnormal findings: Secondary | ICD-10-CM

## 2022-02-28 DIAGNOSIS — I10 Essential (primary) hypertension: Secondary | ICD-10-CM | POA: Diagnosis not present

## 2022-02-28 DIAGNOSIS — E669 Obesity, unspecified: Secondary | ICD-10-CM

## 2022-02-28 MED ORDER — METHOCARBAMOL 500 MG PO TABS
500.0000 mg | ORAL_TABLET | Freq: Three times a day (TID) | ORAL | 1 refills | Status: AC
  Filled 2022-02-28: qty 40, 14d supply, fill #0

## 2022-02-28 MED ORDER — WEGOVY 0.25 MG/0.5ML ~~LOC~~ SOAJ
0.2500 mg | SUBCUTANEOUS | 0 refills | Status: AC
  Filled 2022-02-28 – 2022-03-13 (×2): qty 2, 28d supply, fill #0

## 2022-02-28 MED ORDER — IBUPROFEN 800 MG PO TABS
800.0000 mg | ORAL_TABLET | Freq: Three times a day (TID) | ORAL | 0 refills | Status: AC | PRN
  Filled 2022-02-28: qty 20, 7d supply, fill #0

## 2022-02-28 NOTE — Progress Notes (Signed)
SUBJECTIVE:   Chief compliant/HPI: annual examination  Lacey Barron is a 66 y.o. who presents today for an annual exam.   Patient wants to discuss weight loss.  She states she weighs more than she has ever. About 3 years ago with stress picked up weight.  Weight in the 190s 3 years ago. Has been reducing carbs and calories. Walks for exercise about 1 day a week but tries to walk on a daily. Wants to discuss starting [facility]  Additionally she endorses right shoulder pain. Feb 2022 slid and fell on her right upper body which caused cervical pain and shoulder pain. Worked with PT. This Feb had a flare of her shoulder pain. States pain radiates from cervical region to upper R arm Hurts worse at night. Hurts at rest, currently feels it- states it is nagging but tolerable. Will apply aleve cream in the morning which helps Has home exercise program from PT that she doers 2-3 times a week. Takes Ibuprofen '800mg'$  3-4 times a week. Would like refill of Robaxin   Denies tobacco use, alcohol, recreational drugs   Health maintenance: Due for Pap, pneumonia vaccine, DEXA scan  OBJECTIVE:   BP 112/72   Pulse 99   Wt 211 lb (95.7 kg)   LMP 10/14/2013   SpO2 100%   BMI 35.11 kg/m    Physical exam General: well appearing, NAD Cardiovascular: RRR, no murmurs Lungs: CTAB. Normal WOB Abdomen: soft, non-distended, non-tender Skin: warm, dry. No edema  MSK R Shoulder: Inspection reveals no obvious deformity, atrophy, or asymmetry b/l. No bruising. No swelling Palpation is normal with no TTP over [facility] joint or bicipital groove b/l. Full ROM in flexion, abduction, internal/external rotation b/l NV intact distally b/l Normal scapular function observed b/l  ASSESSMENT/PLAN:   Obesity (BMI 35.0-39.9 without comorbidity) BMI 35.  Patient has been trying to lose weight by watching what she eats, and walking when she can.  She is interested in starting medication.  Discussed Wegovy, the side  effects, and how to use it.  Prescribed Wegovy 0.25 mg weekly.  Discussed importance of continuing healthy eating and getting in physical activity in addition to the medication for best results.  Let her know that it is up to insurance whether they will cover it or not.  We will follow-up in 2 weeks after she starts it to ensure she is tolerating it and increased dose if indicated.  Shoulder pain, right Likely from known cervical degenerative disc disease. Refilled ibuprofen 800 mg and Robaxin 500 mg.  Discussed continuing her home exercise program.  If no improvement with conservative measures, can refer to Ortho for additional treatment options.  Strict return precautions discussed.  HYPERTENSION, BENIGN, MILD BP 112/72.  Stable on amlodipine 5 mg.  We will continue current regimen.  We will check BMP for kidney function.   Annual Examination  See AVS for age appropriate recommendations.  PHQ score 1, reviewed and discussed.  Blood pressure value is 112/72 and at goal, discussed. Will continue on amlodipine 5 mg  Considered the following screening exams based upon USPSTF recommendations: Diabetes screening: discussed and ordered Screening for elevated cholesterol: discussed and ordered HIV testing:  not indicated Hepatitis C:  Already performed and negative.  Repeat testing not indicated at time Hepatitis B:  not ordered  Syphilis if at high risk:  not indicated  Reviewed risk factors for latent tuberculosis and not indicated Colorectal cancer screening: up to date on screening for CRC. We will do  Pap smear follow-up We will order DEXA scan at follow-up   [place]

## 2022-02-28 NOTE — Patient Instructions (Signed)
It was great seeing you today!  Today you came in for your annual physical and we also discussed her shoulder pain as well as starting on Wegovy.  I have put in the prescription for the [facility] which she will take at the same day every week.  We are starting at a low dose, and after you get the medication please come in for follow-up 2 weeks after.  At this time we can also discuss the items we did not get to today and do your Pap smear.  I will call you with any abnormal results from your blood work, we will send a my chart message if normal.  Visit Reminders: - Stop by the pharmacy to pick up your prescriptions  - Continue to work on your healthy eating habits and incorporating exercise into your daily life.    Feel free to call with any questions or concerns at any time, at [phone].   Take care,  Dr. Shary Key [facility]

## 2022-03-01 LAB — LIPID PANEL
Chol/HDL Ratio: 4.1 ratio (ref 0.0–4.4)
Cholesterol, Total: 154 mg/dL (ref 100–199)
HDL: 38 mg/dL — ABNORMAL LOW (ref >39)
LDL Chol Calc (NIH): 100 mg/dL — ABNORMAL HIGH (ref 0–99)
Triglycerides: 84 mg/dL (ref 0–149)
VLDL Cholesterol Cal: 16 mg/dL (ref 5–40)

## 2022-03-01 LAB — VITAMIN B12: Vitamin B-12: 1489 pg/mL — ABNORMAL HIGH (ref 232–1245)

## 2022-03-01 LAB — BASIC METABOLIC PANEL
BUN/Creatinine Ratio: 20 (ref 12–28)
BUN: 18 mg/dL (ref 8–27)
CO2: 26 mmol/L (ref 20–29)
Calcium: 9.7 mg/dL (ref 8.7–10.3)
Chloride: 100 mmol/L (ref 96–106)
Creatinine, Ser: 0.89 mg/dL (ref 0.57–1.00)
Glucose: 132 mg/dL — ABNORMAL HIGH (ref 70–99)
Potassium: 4.1 mmol/L (ref 3.5–5.2)
Sodium: 143 mmol/L (ref 134–144)
eGFR: 72 mL/min/{1.73_m2} (ref >59)

## 2022-03-01 LAB — HEMOGLOBIN A1C
Est. average glucose Bld gHb Est-mCnc: 100 mg/dL
Hgb A1c MFr Bld: 5.1 % (ref 4.8–5.6)

## 2022-03-01 LAB — CBC
Hematocrit: 38.2 % (ref 34.0–46.6)
Hemoglobin: 12.4 g/dL (ref 11.1–15.9)
MCH: 23.4 pg — ABNORMAL LOW (ref 26.6–33.0)
MCHC: 32.5 g/dL (ref 31.5–35.7)
MCV: 72 fL — ABNORMAL LOW (ref 79–97)
NRBC: 1 % — ABNORMAL HIGH (ref 0–0)
Platelets: 281 10*3/uL (ref 150–450)
RBC: 5.3 x10E6/uL — ABNORMAL HIGH (ref 3.77–5.28)
RDW: 18.9 % — ABNORMAL HIGH (ref 11.7–15.4)
WBC: 5.9 10*3/uL (ref 3.4–10.8)

## 2022-03-03 DIAGNOSIS — M25511 Pain in right shoulder: Secondary | ICD-10-CM | POA: Insufficient documentation

## 2022-03-03 NOTE — Assessment & Plan Note (Signed)
BMI 35.  Patient has been trying to lose weight by watching what she eats, and walking when she can.  She is interested in starting medication.  Discussed Wegovy, the side effects, and how to use it.  Prescribed Wegovy 0.25 mg weekly.  Discussed importance of continuing healthy eating and getting in physical activity in addition to the medication for best results.  Let her know that it is up to insurance whether they will cover it or not.  We will follow-up in 2 weeks after she starts it to ensure she is tolerating it and increased dose if indicated.

## 2022-03-03 NOTE — Assessment & Plan Note (Signed)
BP 112/72.  Stable on amlodipine 5 mg.  We will continue current regimen.  We will check BMP for kidney function.

## 2022-03-03 NOTE — Assessment & Plan Note (Signed)
Likely from known cervical degenerative disc disease. Refilled ibuprofen 800 mg and Robaxin 500 mg.  Discussed continuing her home exercise program.  If no improvement with conservative measures, can refer to Ortho for additional treatment options.  Strict return precautions discussed.

## 2022-03-05 ENCOUNTER — Telehealth: Payer: Self-pay

## 2022-03-05 NOTE — Telephone Encounter (Signed)
A Prior Authorization was initiated for this patients WEGOVY through CoverMyMeds.   Key: [id number]

## 2022-03-08 NOTE — Telephone Encounter (Signed)
Prior Auth for patients medication WEGOVY denied by [facility] via CoverMyMeds.   Reason: For approval of Wegovy to help with weight loss, our guideline named ANTI-OBESITY AGENTS requires that you are actively enrolled in an exercise and caloric reduction program or a weight loss/behavioral modification program.   DENIAL LETTER SCANNED TO CHART   CoverMyMeds Key:  [id number]

## 2022-03-13 ENCOUNTER — Other Ambulatory Visit (HOSPITAL_COMMUNITY): Payer: Self-pay

## 2022-03-14 NOTE — Telephone Encounter (Signed)
Submitted appeal for [facility] via CoverMyMeds.   Key: [id number]

## 2022-03-16 NOTE — Telephone Encounter (Signed)
Appeal rec'd.  Determination will be mailed no later than 15 days from receipt date (03/14/22)

## 2022-03-16 NOTE — Telephone Encounter (Signed)
Fax rec'd  Prior Auth APPEAL for patients medication WEGOVY denied by [facility] via CoverMyMeds.   Reason: IN ORDER TO BE APPROVED, PROVIDER MUST SHOW YOU HAVE MET GUIDELINES: SHOW THAT YOU ARE ACTIVELY ENROLLED IN AN EXERCISE AND CALORIC REDUCTION PROGRAM OR A WEIGHT LOSS/ BEHAVIORAL MODIFICATION PROGRAM.

## 2022-03-19 ENCOUNTER — Other Ambulatory Visit: Payer: Self-pay

## 2022-03-19 ENCOUNTER — Other Ambulatory Visit (HOSPITAL_COMMUNITY): Payer: Self-pay

## 2022-03-19 ENCOUNTER — Ambulatory Visit (INDEPENDENT_AMBULATORY_CARE_PROVIDER_SITE_OTHER): Payer: 59 | Admitting: Family Medicine

## 2022-03-19 ENCOUNTER — Other Ambulatory Visit (HOSPITAL_COMMUNITY)
Admission: RE | Admit: 2022-03-19 | Discharge: 2022-03-19 | Disposition: A | Discharge status: Home / Self Care | Payer: 59 | Source: Ambulatory Visit | Attending: Family Medicine | Admitting: Family Medicine

## 2022-03-19 ENCOUNTER — Encounter: Payer: Self-pay | Admitting: Family Medicine

## 2022-03-19 VITALS — BP 165/79 | HR 84 | Wt 212.0 lb

## 2022-03-19 DIAGNOSIS — Z124 Encounter for screening for malignant neoplasm of cervix: Secondary | ICD-10-CM

## 2022-03-19 DIAGNOSIS — N3946 Mixed incontinence: Secondary | ICD-10-CM

## 2022-03-19 MED ORDER — MIRABEGRON ER 25 MG PO TB24
25.0000 mg | ORAL_TABLET | Freq: Every day | ORAL | 0 refills | Status: AC
  Filled 2022-03-19: qty 30, 30d supply, fill #0

## 2022-03-19 NOTE — Progress Notes (Signed)
    SUBJECTIVE:   CHIEF COMPLAINT / HPI:   Patient presents for Pap smear, and to discuss overactive bladder.  Endorses leaky bladder for the past couple of years been worsening recently.  She suddenly has the urge to urinate, and sometimes does not make it time to the restroom.  Also endorses leakage when she coughs, sneezes, etc.  Denies burning, or any other urinary symptoms.  Was given pelvic floor exercises such as kegels but without improvement.  Denies trying medication but would like to.  Due for Pap smear, declines STI testing.  LMP in 2015.   PERTINENT  PMH / PSH: Reviewed   OBJECTIVE:   BP (!) 165/79   Pulse 84   Wt 212 lb (96.2 kg)   LMP 10/14/2013   SpO2 98%   BMI 35.28 kg/m    Physical exam General: well appearing, NAD Lungs: Normal WOB Abdomen: soft, non-distended Skin: warm, dry. No edema Pelvic exam: normal external genitalia, vulva, vagina, cervix  ASSESSMENT/PLAN:   Urinary incontinence For the past couple of years patient has had a mixture of urge and stress incontinence and worsening recently.  She has tried conservative measurement including pelvic floor exercises/Kegel's without improvement. She was previously educated on reducing caffeine intake and other potential contributing factors. Would like to try medication today.  Prescribed Myrbetriq 25 mg daily, and can increase to 50 mg if needed at next refill.  Cervical cancer screening Physical exam benign.  Does have history of abnormal Pap in 2019 positive for HPV, so will need another Pap smear in the future and the timing of this will depend on her results from current pap. Declines STI testing.     [place]

## 2022-03-19 NOTE — Assessment & Plan Note (Signed)
For the past couple of years patient has had a mixture of urge and stress incontinence and worsening recently.  She has tried conservative measurement including pelvic floor exercises/Kegel's without improvement. She was previously educated on reducing caffeine intake and other potential contributing factors. Would like to try medication today.  Prescribed Myrbetriq 25 mg daily, and can increase to 50 mg if needed at next refill.

## 2022-03-19 NOTE — Assessment & Plan Note (Signed)
Physical exam benign.  Does have history of abnormal Pap in 2019 positive for HPV, so will need another Pap smear in the future and the timing of this will depend on her results from current pap. Declines STI testing.

## 2022-03-19 NOTE — Patient Instructions (Signed)
It was great seeing you today!  Today we did your Pap smear, and I will call you if anything is abnormal we will send you a MyChart message if normal.  For your urinary incontinence, I am prescribing a medication called Myrbetriq to hopefully help with this.  We are starting with 25 mg a day, and we can increase to 50 mg next refill if you are tolerating it well.  [facility] [phone].  Give them a call to be added to their wait list   Visit Reminders: - Stop by the pharmacy to pick up your prescriptions  - Continue to work on your healthy eating habits and incorporating exercise into your daily life.    Feel free to call with any questions or concerns at any time, at [phone].   Take care,  Dr. Shary Key [facility]

## 2022-03-20 LAB — CYTOLOGY - PAP
Comment: NEGATIVE
Diagnosis: NEGATIVE
High risk HPV: NEGATIVE

## 2022-04-03 ENCOUNTER — Other Ambulatory Visit: Payer: Self-pay | Admitting: Family Medicine

## 2022-04-03 ENCOUNTER — Other Ambulatory Visit (HOSPITAL_COMMUNITY): Payer: Self-pay

## 2022-04-03 DIAGNOSIS — J309 Allergic rhinitis, unspecified: Secondary | ICD-10-CM

## 2022-04-03 DIAGNOSIS — I10 Essential (primary) hypertension: Secondary | ICD-10-CM

## 2022-04-03 MED ORDER — ALBUTEROL SULFATE HFA 108 (90 BASE) MCG/ACT IN AERS
2.0000 | INHALATION_SPRAY | RESPIRATORY_TRACT | 0 refills | Status: AC | PRN
  Filled 2022-04-03: qty 6.7, 17d supply, fill #0

## 2022-04-03 MED ORDER — FLUTICASONE PROPIONATE 50 MCG/ACT NA SUSP
2.0000 | Freq: Every day | NASAL | 2 refills | Status: AC
  Filled 2022-04-03: qty 16, 30d supply, fill #0
  Filled 2022-07-04: qty 16, 30d supply, fill #1

## 2022-04-03 MED ORDER — AMLODIPINE BESYLATE 5 MG PO TABS
5.0000 mg | ORAL_TABLET | Freq: Every day | ORAL | 2 refills | Status: AC
  Filled 2022-04-03: qty 90, 90d supply, fill #0
  Filled 2022-07-04: qty 90, 90d supply, fill #1

## 2022-04-03 MED ORDER — BUDESONIDE-FORMOTEROL FUMARATE 80-4.5 MCG/ACT IN AERO
2.0000 | INHALATION_SPRAY | Freq: Two times a day (BID) | RESPIRATORY_TRACT | 3 refills | Status: AC
  Filled 2022-04-03: qty 10.2, 30d supply, fill #0
  Filled 2022-07-04: qty 10.2, 30d supply, fill #1

## 2022-04-03 MED ORDER — MONTELUKAST SODIUM 10 MG PO TABS
10.0000 mg | ORAL_TABLET | Freq: Every day | ORAL | 2 refills | Status: AC
  Filled 2022-04-03: qty 90, 90d supply, fill #0

## 2022-04-03 MED ORDER — LISINOPRIL-HYDROCHLOROTHIAZIDE 20-25 MG PO TABS
1.0000 | ORAL_TABLET | Freq: Every day | ORAL | 2 refills | Status: AC
  Filled 2022-04-03: qty 90, 90d supply, fill #0
  Filled 2022-07-04: qty 90, 90d supply, fill #1

## 2022-05-23 ENCOUNTER — Other Ambulatory Visit: Payer: Self-pay | Admitting: Family Medicine

## 2022-05-23 DIAGNOSIS — Z1231 Encounter for screening mammogram for malignant neoplasm of breast: Secondary | ICD-10-CM

## 2022-05-29 ENCOUNTER — Ambulatory Visit
Admission: RE | Admit: 2022-05-29 | Discharge: 2022-05-29 | Disposition: A | Discharge status: Home / Self Care | Payer: 59 | Source: Ambulatory Visit

## 2022-05-29 DIAGNOSIS — Z1231 Encounter for screening mammogram for malignant neoplasm of breast: Secondary | ICD-10-CM | POA: Diagnosis not present

## 2022-07-04 ENCOUNTER — Other Ambulatory Visit (HOSPITAL_COMMUNITY): Payer: Self-pay

## 2022-07-27 DIAGNOSIS — H524 Presbyopia: Secondary | ICD-10-CM | POA: Diagnosis not present

## 2022-07-27 DIAGNOSIS — H52223 Regular astigmatism, bilateral: Secondary | ICD-10-CM | POA: Diagnosis not present

## 2022-07-27 DIAGNOSIS — H5213 Myopia, bilateral: Secondary | ICD-10-CM | POA: Diagnosis not present

## 2022-12-17 ENCOUNTER — Telehealth: Payer: Self-pay | Admitting: Pharmacist

## 2022-12-17 NOTE — Telephone Encounter (Addendum)
  Patient attempted to be outreached by Karlton Lemon, PharmD Candidate on 12/17/2022 to discuss hypertension. Left voicemail for patient to return our call at their convenience at [phone].  Karlton Lemon, PharmD Candidate    Catie Eppie Gibson, PharmD, BCACP, CPP [facility] [phone]
